# Patient Record
Sex: Male | Born: 1935
Health system: Southern US, Community
[De-identification: ages and names within clinical notes are randomized; demographics above are authoritative.]

## PROBLEM LIST (undated history)

## (undated) DIAGNOSIS — Z9889 Other specified postprocedural states: Secondary | ICD-10-CM

## (undated) DIAGNOSIS — M1710 Unilateral primary osteoarthritis, unspecified knee: Secondary | ICD-10-CM

## (undated) DIAGNOSIS — Z951 Presence of aortocoronary bypass graft: Secondary | ICD-10-CM

## (undated) DIAGNOSIS — M67439 Ganglion, unspecified wrist: Secondary | ICD-10-CM

## (undated) DIAGNOSIS — I10 Essential (primary) hypertension: Secondary | ICD-10-CM

## (undated) DIAGNOSIS — K219 Gastro-esophageal reflux disease without esophagitis: Secondary | ICD-10-CM

## (undated) DIAGNOSIS — M179 Osteoarthritis of knee, unspecified: Secondary | ICD-10-CM

## (undated) DIAGNOSIS — IMO0001 Reserved for inherently not codable concepts without codable children: Secondary | ICD-10-CM

## (undated) DIAGNOSIS — E785 Hyperlipidemia, unspecified: Secondary | ICD-10-CM

## (undated) DIAGNOSIS — E559 Vitamin D deficiency, unspecified: Secondary | ICD-10-CM

## (undated) DIAGNOSIS — I255 Ischemic cardiomyopathy: Secondary | ICD-10-CM

## (undated) DIAGNOSIS — R7303 Prediabetes: Secondary | ICD-10-CM

## (undated) DIAGNOSIS — Z95810 Presence of automatic (implantable) cardiac defibrillator: Secondary | ICD-10-CM

## (undated) DIAGNOSIS — I252 Old myocardial infarction: Secondary | ICD-10-CM

## (undated) HISTORY — DX: Unilateral primary osteoarthritis, unspecified knee: M17.10

## (undated) HISTORY — DX: Ganglion, unspecified wrist: M67.439

## (undated) HISTORY — DX: Ischemic cardiomyopathy: I25.5

## (undated) HISTORY — PX: KNEE SURGERY: SHX244

## (undated) HISTORY — DX: Presence of aortocoronary bypass graft: Z95.1

## (undated) HISTORY — DX: Hyperlipidemia, unspecified: E78.5

## (undated) HISTORY — DX: Osteoarthritis of knee, unspecified: M17.9

## (undated) HISTORY — DX: Gastro-esophageal reflux disease without esophagitis: K21.9

## (undated) HISTORY — DX: Presence of automatic (implantable) cardiac defibrillator: Z95.810

## (undated) HISTORY — DX: Old myocardial infarction: I25.2

## (undated) HISTORY — DX: Prediabetes: R73.03

## (undated) HISTORY — DX: Reserved for inherently not codable concepts without codable children: IMO0001

## (undated) HISTORY — DX: Vitamin D deficiency, unspecified: E55.9

## (undated) HISTORY — PX: INGUINAL HERNIA REPAIR: SUR1180

## (undated) HISTORY — DX: Other specified postprocedural states: Z98.890

---

## 1990-03-03 DIAGNOSIS — Z951 Presence of aortocoronary bypass graft: Secondary | ICD-10-CM

## 1990-03-03 HISTORY — PX: CORONARY ARTERY BYPASS GRAFT: SHX141

## 1990-03-03 HISTORY — DX: Presence of aortocoronary bypass graft: Z95.1

## 1997-06-07 ENCOUNTER — Ambulatory Visit (HOSPITAL_COMMUNITY): Admission: RE | Admit: 1997-06-07 | Discharge: 1997-06-07 | Payer: Self-pay | Admitting: Gastroenterology

## 2001-04-26 ENCOUNTER — Ambulatory Visit (HOSPITAL_COMMUNITY): Admission: RE | Admit: 2001-04-26 | Discharge: 2001-04-26 | Payer: Self-pay | Admitting: Gastroenterology

## 2004-03-03 DIAGNOSIS — Z9889 Other specified postprocedural states: Secondary | ICD-10-CM

## 2004-03-03 HISTORY — DX: Other specified postprocedural states: Z98.890

## 2004-03-03 HISTORY — PX: CARDIAC CATHETERIZATION: SHX172

## 2004-10-09 ENCOUNTER — Ambulatory Visit (HOSPITAL_COMMUNITY): Admission: RE | Admit: 2004-10-09 | Discharge: 2004-10-09 | Payer: Self-pay | Admitting: Internal Medicine

## 2004-10-10 ENCOUNTER — Ambulatory Visit (HOSPITAL_COMMUNITY): Admission: RE | Admit: 2004-10-10 | Discharge: 2004-10-10 | Payer: Self-pay | Admitting: Internal Medicine

## 2004-10-14 ENCOUNTER — Ambulatory Visit (HOSPITAL_COMMUNITY): Admission: RE | Admit: 2004-10-14 | Discharge: 2004-10-14 | Payer: Self-pay | Admitting: Cardiology

## 2004-10-14 HISTORY — PX: CARDIAC CATHETERIZATION: SHX172

## 2006-01-28 ENCOUNTER — Encounter (INDEPENDENT_AMBULATORY_CARE_PROVIDER_SITE_OTHER): Payer: Self-pay | Admitting: *Deleted

## 2006-01-28 ENCOUNTER — Ambulatory Visit (HOSPITAL_COMMUNITY): Admission: RE | Admit: 2006-01-28 | Discharge: 2006-01-28 | Payer: Self-pay | Admitting: Cardiology

## 2006-10-30 ENCOUNTER — Ambulatory Visit (HOSPITAL_COMMUNITY): Admission: RE | Admit: 2006-10-30 | Discharge: 2006-10-30 | Payer: Self-pay | Admitting: Internal Medicine

## 2008-03-03 HISTORY — PX: OTHER SURGICAL HISTORY: SHX169

## 2008-08-01 ENCOUNTER — Encounter: Payer: Self-pay | Admitting: Internal Medicine

## 2008-08-01 DIAGNOSIS — IMO0001 Reserved for inherently not codable concepts without codable children: Secondary | ICD-10-CM

## 2008-08-01 HISTORY — DX: Reserved for inherently not codable concepts without codable children: IMO0001

## 2008-08-10 ENCOUNTER — Encounter: Payer: Self-pay | Admitting: Internal Medicine

## 2008-08-23 ENCOUNTER — Encounter: Payer: Self-pay | Admitting: Internal Medicine

## 2008-09-11 ENCOUNTER — Ambulatory Visit: Payer: Self-pay | Admitting: Internal Medicine

## 2008-09-11 DIAGNOSIS — R609 Edema, unspecified: Secondary | ICD-10-CM

## 2008-09-14 ENCOUNTER — Encounter: Payer: Self-pay | Admitting: Internal Medicine

## 2008-09-29 ENCOUNTER — Ambulatory Visit: Payer: Self-pay | Admitting: Internal Medicine

## 2008-09-29 DIAGNOSIS — I255 Ischemic cardiomyopathy: Secondary | ICD-10-CM

## 2008-10-01 DIAGNOSIS — Z95810 Presence of automatic (implantable) cardiac defibrillator: Secondary | ICD-10-CM

## 2008-10-01 HISTORY — DX: Presence of automatic (implantable) cardiac defibrillator: Z95.810

## 2008-10-01 HISTORY — PX: CARDIAC DEFIBRILLATOR PLACEMENT: SHX171

## 2008-10-03 LAB — CONVERTED CEMR LAB
BUN: 15 mg/dL
Basophils Absolute: 0 10*3/uL
Basophils Relative: 0.7 %
CO2: 31 meq/L
Calcium: 9.3 mg/dL
Chloride: 97 meq/L
Creatinine, Ser: 0.8 mg/dL
Eosinophils Absolute: 0.2 10*3/uL
Eosinophils Relative: 3.7 %
GFR calc non Af Amer: 99.14 mL/min
Glucose, Bld: 90 mg/dL
HCT: 34.2 % — ABNORMAL LOW
Hemoglobin: 12 g/dL — ABNORMAL LOW
INR: 1
Lymphocytes Relative: 17.6 %
Lymphs Abs: 1 10*3/uL
MCHC: 35 g/dL
MCV: 98.5 fL
Monocytes Absolute: 0.7 10*3/uL
Monocytes Relative: 12.1 % — ABNORMAL HIGH
Neutro Abs: 3.7 10*3/uL
Neutrophils Relative %: 65.9 %
Platelets: 203 10*3/uL
Potassium: 4.9 meq/L
Prothrombin Time: 11.1 s
RBC: 3.47 M/uL — ABNORMAL LOW
RDW: 13 %
Sodium: 134 meq/L — ABNORMAL LOW
WBC: 5.6 10*3/uL
aPTT: 25 s

## 2008-10-05 ENCOUNTER — Inpatient Hospital Stay (HOSPITAL_COMMUNITY): Admission: RE | Admit: 2008-10-05 | Discharge: 2008-10-06 | Payer: Self-pay | Admitting: Internal Medicine

## 2008-10-05 ENCOUNTER — Ambulatory Visit: Payer: Self-pay | Admitting: Internal Medicine

## 2008-10-06 ENCOUNTER — Encounter: Payer: Self-pay | Admitting: Internal Medicine

## 2008-10-23 ENCOUNTER — Ambulatory Visit: Payer: Self-pay

## 2008-10-23 ENCOUNTER — Encounter: Payer: Self-pay | Admitting: Internal Medicine

## 2008-11-20 ENCOUNTER — Telehealth (INDEPENDENT_AMBULATORY_CARE_PROVIDER_SITE_OTHER): Payer: Self-pay | Admitting: *Deleted

## 2009-10-26 ENCOUNTER — Ambulatory Visit: Payer: Self-pay | Admitting: Cardiology

## 2010-01-24 ENCOUNTER — Ambulatory Visit: Payer: Self-pay | Admitting: Internal Medicine

## 2010-01-31 ENCOUNTER — Ambulatory Visit: Payer: Self-pay | Admitting: Cardiology

## 2010-02-13 ENCOUNTER — Encounter: Payer: Self-pay | Admitting: Internal Medicine

## 2010-04-04 NOTE — Cardiovascular Report (Signed)
Summary: Office Visit Remote   Office Visit Remote   Imported By: Roderic Ovens 02/14/2010 14:10:11  _____________________________________________________________________  External Attachment:    Type:   Image     Comment:   External Document

## 2010-04-04 NOTE — Letter (Signed)
Summary: Remote Device Check  [employer], Main Office  [address]   [place], [place] [address]   Phone: [phone]  Fax: [phone]     February 13, 2010 MRN: [id number]   [facility] [address] [place], [place]  [address]   Dear Richard Moody,   Your remote transmission was recieved and reviewed by your physician.  All diagnostics were within normal limits for you.  __X____Your next office visit is scheduled for:   04-08-10 @ 1015 with Dr Graciela Husbands.    Sincerely,  Vella Kohler

## 2010-04-08 ENCOUNTER — Encounter (INDEPENDENT_AMBULATORY_CARE_PROVIDER_SITE_OTHER): Payer: Medicare Other | Admitting: Internal Medicine

## 2010-04-08 ENCOUNTER — Encounter: Payer: Self-pay | Admitting: Internal Medicine

## 2010-04-08 DIAGNOSIS — I5022 Chronic systolic (congestive) heart failure: Secondary | ICD-10-CM | POA: Insufficient documentation

## 2010-04-08 DIAGNOSIS — I2589 Other forms of chronic ischemic heart disease: Secondary | ICD-10-CM

## 2010-04-08 DIAGNOSIS — Z95810 Presence of automatic (implantable) cardiac defibrillator: Secondary | ICD-10-CM | POA: Insufficient documentation

## 2010-04-18 NOTE — Miscellaneous (Signed)
  Clinical Lists Changes  Observations: Added new observation of PI CARDIO: Your physician recommends that you continue on your current medications as directed. Please refer to the Current Medication list given to you today. Your physician wants you to follow-up in: YEAR WITH DR Logan Bores will receive a reminder letter in the mail two months in advance. If you don't receive a letter, please call our office to schedule the follow-up appointment. (04/08/2010 11:14)      Patient Instructions: 1)  Your physician recommends that you continue on your current medications as directed. Please refer to the Current Medication list given to you today. 2)  Your physician wants you to follow-up in: YEAR WITH DR Logan Bores will receive a reminder letter in the mail two months in advance. If you don't receive a letter, please call our office to schedule the follow-up appointment.

## 2010-04-18 NOTE — Cardiovascular Report (Signed)
Summary: Office Visit   Office Visit   Imported By: Roderic Ovens 04/09/2010 16:05:34  _____________________________________________________________________  External Attachment:    Type:   Image     Comment:   External Document

## 2010-04-18 NOTE — Assessment & Plan Note (Signed)
Summary: phys defib check/icd/medtronic/kl   Referring Provider:  Delfin Edis Primary Provider:  Diamantina Monks  CC:  defib check.  History of Present Illness: Richard Moody is seen in followup for ICD implanted in 2010.  He has a history of ischemic heart disease with prior bypass surgery depressed left ventricular function with EES of 32%. His chronic systolic heart failure but has done relatively well. He actually underwent knee replacement surgery at [facility] this fall and tolerated that amazingly well. He denies chest pain or peripheral edema.  Current Medications (verified): 1)  Nexium 40 Mg Pack (Esomeprazole Magnesium) .Marland Kitchen.. 1 Once Daily 2)  Enablex 7.5 Mg Xr24h-Tab (Darifenacin Hydrobromide) .Marland Kitchen.. 1 Tab Once Daily 3)  Quinapril Hcl 40 Mg Tabs (Quinapril Hcl) .Marland Kitchen.. 1 Tab Qd 4)  Toprol Xl 50 Mg Xr24h-Tab (Metoprolol Succinate) .... Once Daily 5)  Xanax 1 Mg Tabs (Alprazolam) .... As Needed 6)  Furosemide 40 Mg Tabs (Furosemide) .... Take 2 Tablets Daily 7)  Ultravate 0.05 % Crea (Halobetasol Propionate) .... Apply To Affected Areas 1-2 Times Per Day 8)  Clobetasol Propionate 0.05 % Soln (Clobetasol Propionate) .... Apply To Scalp 1-3 Times Per Week 9)  Fludrocortisone Acetate 0.1 Mg Tabs (Fludrocortisone Acetate) .... Take 1 Tablet By Mouth Once A Day 10)  Aspirin 81 Mg Tbec (Aspirin) .... Take 3 Tablets Weekly 11)  Calcium Carbonate-Vitamin D 600-400 Mg-Unit  Tabs (Calcium Carbonate-Vitamin D) .... Take 2 Tablets Daily 12)  Glucosamine-Chondroitin   Caps (Glucosamine-Chondroit-Vit C-Mn) .... Take 2 Capsules By Mouth Daily 13)  Vitamin C 500 Mg  Tabs (Ascorbic Acid) .... Take 4 Tablets Daily 14)  Vitamin D 1000 Unit  Tabs (Cholecalciferol) .... Take 4 Capsules Daily 15)  Red Yeast Rice   Powd (Red Yeast Rice Extract) .... Take 2/3 Scoop Per Day 16)  Magnesium 250 Mg Tabs (Magnesium) .... Take 1 Tablet Daily 17)  Allegra 180 Mg Tabs (Fexofenadine Hcl) .... Take 2 Tablets Daily 18)  Slow Fe  160 (50 Fe) Mg Cr-Tabs (Ferrous Sulfate Dried) .... Once Daily 19)  B Complex  Tabs (B Complex Vitamins) .... Once Daily 20)  Vicodin 5-500 Mg Tabs (Hydrocodone-Acetaminophen) .... Take 1-2 Tablets As Needed For Pain Every 6 Hrs  Allergies (verified): No Known Drug Allergies  Past History:  Past Medical History: Last updated: 09/11/2008 Artherosclerotic cerebrovascular disease hyperlipidemia CABG x 4 Anemia Osteoarthritis GI bleeding/reflux  Past Surgical History: Last updated: 09/11/2008 CABG x 4 inguinal hernia repair  Family History: Last updated: 2008/10/06 Father-deceased age 83.  HTN/CVA Mother-deceased/abdominal cancer family hx of colon ca.-mother and sister  Social History: Last updated: 2008/10/06 Married  Tobacco Use - No.   Past Cardiac History:  Cardiac Interventions: Cardiac Catheterization 2006 totally occluded RCA and distal LAD with severe diffuse disease in the circumflex Patent LIMA patent vein graft to the acute marginal and posterior descending and occluded vein graft to the circumflex  Vital Signs:  Patient profile:   77 year old male Height:      73 inches Weight:      166 pounds BMI:     21.98 Pulse rate:   62 / minute Pulse rhythm:   regular BP sitting:   112 / 66  (left arm) Cuff size:   regular  Vitals Entered By: Judithe Modest CMA (April 08, 2010 10:14 AM)  Physical Exam  General:  The patient was alert and oriented in no acute distress. HEENT Normal.  Neck veins were flat, carotids were brisk.  Lungs were clear.  Heart sounds were regular without murmurs or gallops.  Abdomen was soft with active bowel sounds. There is no clubbing cyanosis or edema. Skin Warm and dry     ICD Specifications Following MD:  Inactive     Referring MD:  TENNANT ICD Vendor:  Medtronic     ICD Model Number:  [id number]     ICD Serial Number:  [id number] ICD DOI:  10/05/2008     ICD Implanting MD:  Sherryl Manges, MD  Lead 1:    Location: RA      DOI: 10/05/2008     Model #: [id number]     Serial #: [id number]     Status: active Lead 2:    Location: RV     DOI: 10/05/2008     Model #: [id number]     Serial #: [id number]     Status: active  Indications::  VT/VF   ICD Follow Up Battery Voltage:  3.13 V     Charge Time:  9.4 seconds     Underlying rhythm:  SB @ 55 ICD Dependent:  No       ICD Device Measurements Atrium:  Amplitude: 3.6 mV, Impedance: 494 ohms, Threshold: 0.50 V at 0.40 msec Right Ventricle:  Amplitude: 12.6 mV, Impedance: 437 ohms, Threshold: 1.00 V at 0.40 msec Shock Impedance: 46/58 ohms   Episodes MS Episodes:  0     Shock:  0     ATP:  0     Nonsustained:  0     Atrial Therapies:  0 Atrial Pacing:  58.1%     Ventricular Pacing:  0.2%  Brady Parameters Mode MVP (R)     Lower Rate Limit:  60     Upper Rate Limit 120 PAV 180     Sensed AV Delay:  150  Tachy Zones VF:  222     VT:  188     Next Remote Date:  07/11/2010     Next Cardiology Appt Due:  04/04/2011 Tech Comments:  NORMAL DEVICE FUNCTION.  NO EPISODES SINCE LAST CHECK.  TURNED ON 1:1 SVT.  CARELINK 07-11-10 AND ROV IN 12 MTHS W/SK. Vella Kohler April 08, 2010 10:36 AM  Impression & Recommendations:  Problem # 1:  CARDIOMYOPATHY, ISCHEMIC S/P CABG (ICD-414.8) stable on his current medications. His updated medication list for this problem includes:    Quinapril Hcl 40 Mg Tabs (Quinapril hcl) .Marland Kitchen... 1 tab qd    Toprol Xl 50 Mg Xr24h-tab (Metoprolol succinate) ..... Once daily    Furosemide 40 Mg Tabs (Furosemide) .Marland Kitchen... Take 2 tablets daily    Aspirin 81 Mg Tbec (Aspirin) .Marland Kitchen... Take 3 tablets weekly  Problem # 2:  SYSTOLIC HEART FAILURE, CHRONIC (ICD-428.22) stable on current meds However, his optivol  index is significantly elevated. yet he isimpedance measurement has recently started to increase again. We will plan a recheck of his optivol in 2 weeks His updated medication list for this problem includes:    Quinapril Hcl 40 Mg Tabs (Quinapril hcl)  .Marland Kitchen... 1 tab qd    Toprol Xl 50 Mg Xr24h-tab (Metoprolol succinate) ..... Once daily    Furosemide 40 Mg Tabs (Furosemide) .Marland Kitchen... Take 2 tablets daily    Aspirin 81 Mg Tbec (Aspirin) .Marland Kitchen... Take 3 tablets weekly  Problem # 3:  AUTOMATIC IMPLANTABLE CARDIAC DEFIBRILLATOR SITU (ICD-V45.02) Device parameters and data were reviewed and no changes were made

## 2010-06-08 LAB — APTT: aPTT: 26 seconds (ref 24–37)

## 2010-06-08 LAB — BASIC METABOLIC PANEL
BUN: 15 mg/dL (ref 6–23)
Calcium: 9.7 mg/dL (ref 8.4–10.5)
Chloride: 93 mEq/L — ABNORMAL LOW (ref 96–112)
Creatinine, Ser: 0.82 mg/dL (ref 0.4–1.5)
GFR calc non Af Amer: >60 mL/min (ref >60)
Sodium: 131 mEq/L — ABNORMAL LOW (ref 135–145)

## 2010-06-08 LAB — PROTIME-INR: INR: 1 (ref 0.00–1.49)

## 2010-06-08 LAB — CBC
HCT: 35.9 % — ABNORMAL LOW (ref 39.0–52.0)
MCV: 99 fL (ref 78.0–100.0)
RDW: 13.1 % (ref 11.5–15.5)
WBC: 4.5 10*3/uL (ref 4.0–10.5)

## 2010-07-16 NOTE — Op Note (Signed)
NAMEBRENNYN, HAISLEY              ACCOUNT NO.:  [id number]   MEDICAL RECORD NO.:  [id number]          PATIENT TYPE:  INP   LOCATION:  3739                         FACILITY:  [facility]   PHYSICIAN:  [facility] Salvia, MD, FACCDATE OF BIRTH:  09-08-1927   DATE OF PROCEDURE:  10/05/2008  DATE OF DISCHARGE:                               OPERATIVE REPORT   PREOPERATIVE DIAGNOSES:  Ischemic cardiomyopathy with depressed left  ventricular function and bradycardia.   POSTOPERATIVE DIAGNOSES:  Ischemic cardiomyopathy with depressed left  ventricular function and bradycardia.   PROCEDURES:  Dual-chamber defibrillator implantation with intraoperative  defibrillation threshold testing.   DESCRIPTION OF PROCEDURE:  Following obtaining informed consent, the  patient was brought to the electrophysiology laboratory and placed on  the fluoroscopic table in the supine position.   After routine prep and drape with care to exclude the excoriated  shoulder, lidocaine was infiltrated in line about a centimeter caudal to  the normal site given the shoulder issue.  An incision was made and  carried down to the layer of the prepectoral fascia using electrocautery  and sharp dissection.  Unfortunately, I violated the pectoral fascia at  this point.  The pocket was then formed, hemostasis was obtained.   Attention was then turned to gain the access to the extrathoracic left  subclavian vein, which was accomplished without difficulty, without the  aspiration of air or puncture of the artery.  Two separate venipunctures  were accomplished.  Guidewires were placed and retained and 0 silk  suture was placed in a figure-of-eight fashion and allowed to hang  loosely.   Sequentially, 9-French and 7-French sheath were placed, which were  passed respectively.  A Sprint Quattro Secure [id number] dual-coil  defibrillator lead, serial number [id number] and a Medtronic 5076, 52-cm  active-fixation atrial lead, serial number  [id number].  Under  fluoroscopic guidance, these leads were manipulated to the right  ventricular apex and the right atrial appendage remnant respectively  where the bipolar P wave was 2.2 with a pace impedance of 675 and  threshold 0.7 V at 0.5 msec.  Current threshold was 1.5 mA.  There was  no diaphragmatic pacing at 10 V, and the current of injury was brisk.   Bipolar R wave was 12.2 with a pace impedance of 825 and threshold of  0.75 at 0.5.  Current threshold was 1.2 mA and there was no  diaphragmatic pacing at 10 V.  Current of injury was brisk.   These leads were secured to the prepectoral fascia and a hemostatic  suture was secured.  The leads were then attached to a Medtronic  Virtuoso II ICD, model number D274DRG, serial number [id number].  Through the device, the bipolar P wave was 2.2 with a pace impedance of  437 and threshold of 1 V at 0.4.  The bipolar R wave was 7.4 with a pace  impedance of 551 and threshold of 0.75 at 0.4.  Proximal coil impedance  was 46 ohms.  Distal coil impedance was 40 ohms.   At this point, ICD implantation was undertaken with defibrillation  threshold testing proceeding.  Ventricular fibrillation was induced via  the T wave shock.  After a total duration of 6.5 seconds, a 15-J shock  was delivered through a measured resistance of 39 ohms terminating  ventricular fibrillation and restoring sinus rhythm.  The device was  implanted.  The pocket was copiously irrigated with antibiotic-  containing saline solution.  Hemostasis was assured.  Surgicel was  placed over the layer of the exposed pectoralis muscle and the device  was secured.  The wound was then closed in 3 layers in a normal fashion.  The wound was washed, dried, and a benzoin, Steri-Strip dressing was  applied.  Needle counts, sponge counts, and instrument counts were  correct at the end of the procedure according to the staff.  The patient  tolerated the procedure without apparent  complication.      Duke Salvia, MD, [facility]  Electronically Signed     SCK/MEDQ  D:  10/05/2008  T:  10/05/2008  Job:  [id number]   cc:   Colleen Can. Deborah Chalk, M.D.

## 2010-07-16 NOTE — Discharge Summary (Signed)
Richard Moody, Richard Moody              ACCOUNT NO.:  [id number]   MEDICAL RECORD NO.:  [id number]          PATIENT TYPE:  INP   LOCATION:  3739                         FACILITY:  [facility]   PHYSICIAN:  [facility] Salvia, MD, FACCDATE OF BIRTH:  1927-12-07   DATE OF ADMISSION:  10/05/2008  DATE OF DISCHARGE:  10/06/2008                               DISCHARGE SUMMARY   ALLERGIES:  This patient has no known drug allergies.   Time for this dictation and to arrange medications according to the new  computer programmed method, greater than 45 minutes.   FINAL DIAGNOSIS:  Discharging day 1, status post implant of a Medtronic  VIRTUOSO II dual-chamber cardioverter defibrillator.   SECONDARY DIAGNOSES:  1. Ischemic cardiomyopathy, ejection fraction 32% at recent Myoview      study.  2. History of myocardial infarction in 1989.  2A.  Recurrent myocardial infarction in 1992, with finding of 3-vessel  coronary artery disease.  2B.  Coronary artery bypass graft surgery x4 in 1992.  1. Left heart catheterization on October 14, 2004, ejection fraction 30-      35%.  3A.  Severe native coronary artery disease.  3B.  Saphenous vein graft to the acute marginal of the posterior  descending artery is patent.  3C.  Saphenous vein graft to the obtuse marginal, is 100% occluded.  3D.  The left internal mammary artery to the left anterior descending is  patent.  1. New York Heart Association classroom IIB chronic systolic      congestive heart failure.  2. Extracranial cerebrovascular occlusive disease.  3. Dyslipidemia.  4. Anemia.  5. Osteoarthritis.  The patient needs knee replacement surgery.  6. History of gastrointestinal bleed.  7. Gastroesophageal reflux disease.   PROCEDURE:  On October 05, 2008, implant of the Medtronic dual-chamber  VIRTUOSO II cardioverter-defibrillator.  The patient had slight ooze,  which has now stopped after the procedure.  He is not unduly suffering  pain from the  procedure.  The device has been interrogated, all values  are within normal limits.  Mobility of the left arm and incision care  have been described to the patient.  He is asked to keep his incision  dry for the next 7 days.  He is to sponge bathe until Thursday October 12, 2008.  He is asked not to drive for the next week.  Once again,  mobility of the left arm has been described to the patient.   FOLLOWUP APPOINTMENTS:  1. Implantable cardioverter-defibrillator Clinic at [facility],      at [address], [address] [place]; Monday, October 23, 2008,      at 9 o'clock.  He has an appointment with Dr. Deborah Chalk on Monday,      October 23, 2008, at 11 o'clock.  He will also see Dr. Deborah Chalk in 3      months.  Dr. Ronnald Nian office will call with that appointment.   MEDICATIONS AT DISCHARGE:  1. Allegra 180 mg twice daily.  2. Alprazolam 1 mg twice daily.  3. Aspirin 81 mg daily.  4. Calcium carbonate vitamin D 1  tablet daily.  5. Clobetasol propionate 0.05% solution apply to the scalp 1-3 times      weekly.  6. Enablex 7.5 mg daily.  7. Darvocet-N 100 one to three tablets by mouth daily as needed.  8. Fish oil caps twice daily.  9. Furosemide 40 mg twice daily.  10.Fludrocortisone acetate 0.1 mg daily.  11.Magnesium sulfate 250 mg daily.  12.Nexium 40 mg daily.  13.Quinapril 40 mg daily.  14.Toprol-XL 50 mg daily.  15.Vitamin D3 daily.  16.Ultravate 0.05% cream, topically twice daily as needed.  17.Vitamin C 1 tablet daily.   BRIEF HISTORY:  Mr. Fohl is a 77 year old male.  He had his first  heart attack in 1989.  He had a second attack in 1992, and this resulted  in coronary artery bypass graft surgery.  He had a recent  catheterization in 2006, which showed severe 3-vessel coronary artery  disease in his native coronaries.  The saphenous vein graft to the  obtuse marginal was totally occluded.  The LIMA to the LAD is patent as  well as a saphenous vein graft to the  acute marginal and the PDA.  Recent Myoview study showed an ejection fraction of 32% with scar, but  no significant ischemia.  The patient's New York Heart Association class  is enmeshed with difficulty ambulating due to osteoarthritis.  He is  currently being considered for knee replacement surgery.  His New York  Heart Association class congestive heart failure could be rated IIB.   The patient certainly is a candidate for ICD implantation given prior  heart attacks and depressed left ventricular function, this would be  placed for primary prevention.  This can be done on an elective basis  and the timing worked out with scheduling for knee surgery.   HOSPITAL COURSE:  The patient presents electively on October 05, 2008, he  underwent procedure to implant a dual-chamber pacemaker the same day.  He has had no postprocedural complications.  He was seen by Dr. Graciela Husbands,  postprocedure day #1 and considered fit for discharge.  The device was  interrogated well.  He has follow up appointments with both Dr. Graciela Husbands  and Dr. Deborah Chalk.   LABORATORY STUDIES THIS ADMISSION:  White cells 4.5, hemoglobin 12.5,  hematocrit 35.9, and platelets are 199.  Sodium 131, potassium 4.4,  chloride 93, carbonate 30, BUN is 15, creatinine 0.82, glucose 89,  Protime 12.6, and INR is 1.      Maple Mirza, [place]      Duke Salvia, MD, [facility]  Electronically Signed    GM/MEDQ  D:  10/06/2008  T:  10/06/2008  Job:  [id number]   cc:   Colleen Can. Deborah Chalk, M.D.  Lucky Cowboy, M.D.
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
## 2010-07-19 NOTE — Cardiovascular Report (Signed)
NAMELELDON, Moody              ACCOUNT NO.:  [id number]   MEDICAL RECORD NO.:  [id number]          PATIENT TYPE:  OIB   LOCATION:  2899                         FACILITY:  [facility]   PHYSICIAN:  Colleen Can. Deborah Chalk, M.D.DATE OF BIRTH:  14-Sep-1927   DATE OF PROCEDURE:  10/14/2004  DATE OF DISCHARGE:  10/14/2004                              CARDIAC CATHETERIZATION   PROCEDURE:  Left heart catheterization with selective coronary angiography,  left ventricular angiography, angiography with saphenous vein grafts x2, and  angiography of left internal mammary artery.   Type and site of entry percutaneous right femoral artery, Angio-Seal for  closure.   CATHETERS:  A 6-French 4 curved Judkins right and left coronary catheters, 6-  French pigtail ventriculographic catheter, 6-French left internal mammary  graft catheter, 6-French right bypass graft catheter.   CONTRAST:  Pure Omnipaque.   MEDICATIONS:  Given prior to the procedure, Valium 10 milligrams.   MEDICATIONS:  Given during the procedure, Versed 2 milligrams IV.   COMMENT:  The patient tolerated the procedure well.   HEMODYNAMIC DATA:  The aortic pressure of 149/82, LV was 163/9-19. There is  no aortic valve gradient noted on pullback.   ANGIOGRAPHIC DATA:  1.  Left main coronary artery has 50% distal narrowing.  2.  Left circumflex has a 60% ostial narrowing and a distal 95% narrowing      prior to the posterior lateral branch. This was a diffuse segmental area      of disease. There is a more proximal obtuse marginal which has a diffuse      segmental 80-90% stenosis. There is retrograde flow into a partial limb      of the saphenous vein graft at this point. Distal vessel is widely      patent.  3.  Right coronary artery is totally occluded proximally.  4.  Left anterior descending has calcification proximally with a 50-60%      segmental narrowing. There are proximal diagonal vessels which are      patent. The left  anterior descending is totally occluded in the      midportion.  5.  Saphenous vein graft to the acute marginal, posterior descending artery      is widely patent.  6.  Saphenous vein graft to the obtuse marginal was occluded.  7.  Left internal mammary graft to the LAD is patent. It has nice insertion      and good distal runoff. The flow appears to be limited primarily to the      left anterior descending distribution.   Left ventricular angiogram was performed in the RAO projection. Estimated  ejection fraction 30-35% range. There is posterior lateral hypokinesia that  is noted. There is no mitral regurgitation.   OVERALL IMPRESSION:  1.  Moderately severe left ventricular dysfunction with posterior lateral      hypokinesia.  2.  Totally occluded right coronary artery and totally occluded distal left      anterior descending with severe diffuse disease in the left circumflex      which was not ideally suited for angioplasty.  3.  Patent left internal mammary artery to the left anterior descending      artery, patent saphenous vein graft to the acute marginal and posterior      descending branches of the right coronary artery, and totally occluded      saphenous vein graft to the left circumflex.   DISCUSSION:  We will try to do a stress Cardiolite study to evaluate the  patient for ischemia versus scar. Even if this were an area of marked  ischemia, there would be a good deal of difficulty in performing a  successful percutaneous intervention and probably will try to manage [name] medically.      Colleen Can. Deborah Chalk, M.D.  Electronically Signed     SNT/MEDQ  D:  10/14/2004  T:  10/14/2004  Job:  [id number]

## 2010-08-20 ENCOUNTER — Encounter: Payer: Self-pay | Admitting: Nurse Practitioner

## 2010-08-26 ENCOUNTER — Ambulatory Visit (INDEPENDENT_AMBULATORY_CARE_PROVIDER_SITE_OTHER): Payer: Medicare Other | Admitting: Nurse Practitioner

## 2010-08-26 ENCOUNTER — Ambulatory Visit: Payer: Medicare Other | Admitting: Nurse Practitioner

## 2010-08-26 ENCOUNTER — Encounter: Payer: Self-pay | Admitting: Nurse Practitioner

## 2010-08-26 VITALS — BP 104/66 | HR 66 | Ht 71.5 in | Wt 164.0 lb

## 2010-08-26 DIAGNOSIS — I5022 Chronic systolic (congestive) heart failure: Secondary | ICD-10-CM

## 2010-08-26 NOTE — Patient Instructions (Signed)
Stay on your current medicines I am going to have you see Dr. Graciela Husbands for follow up in 4 months. Call for any problems.

## 2010-08-26 NOTE — Progress Notes (Signed)
Richard Moody [id number] Date of Birth: 03-17-1927   History of Present Illness: Richard Moody is seen today for his 6 month visit. He is seen for Dr. Graciela Husbands. He is a former patient of Dr. Ronnald Nian. He is doing well from our standpoint. No chest pain. He is not short of breath. No swelling. He has an ischemic cardiomyopathy. ICD is in place. He is on diuretic, ACE and beta blocker. He is not dizzy. He remains limited from his arthritis. He has considerable right shoulder pain and knee pain. His last stress test was in 2010. He has been followed clinically.   Current Outpatient Prescriptions on File Prior to Visit  Medication Sig Dispense Refill  . ALPRAZolam (XANAX) 1 MG tablet Take 1.5 mg by mouth daily.       Marland Kitchen aspirin 81 MG tablet Take 81 mg by mouth 3 (three) times a week.        . Calcium Carbonate (CALTRATE 600 PO) Take by mouth daily. 2 DAILY       . Cholecalciferol (VITAMIN D) 2000 UNITS tablet Take 5,000 Units by mouth daily.        . clobetasol (TEMOVATE) 0.05 % cream Apply topically 3 (three) times a week.        . darifenacin (ENABLEX) 7.5 MG 24 hr tablet Take 7.5 mg by mouth daily.        Marland Kitchen esomeprazole (NEXIUM) 40 MG capsule Take 40 mg by mouth daily before breakfast.        . fexofenadine (ALLEGRA) 180 MG tablet Take 180 mg by mouth daily.        . fish oil-omega-3 fatty acids 1000 MG capsule Take 2 g by mouth daily.        . fludrocortisone (FLORINEF) 0.1 MG tablet Take 0.1 mg by mouth daily.        . furosemide (LASIX) 40 MG tablet Take 40 mg by mouth 2 (two) times daily.        Marland Kitchen glucosamine-chondroitin 500-400 MG tablet Take 1 tablet by mouth 2 (two) times daily.        Marland Kitchen HYDROcodone-acetaminophen (NORCO) 5-325 MG per tablet Take 1 tablet by mouth every 6 (six) hours as needed.        . IRON PO Take 65 mg by mouth daily.       . Magnesium 250 MG TABS Take by mouth daily. 2 DAILY       . metoprolol (TOPROL-XL) 50 MG 24 hr tablet Take 50 mg by mouth daily.        . polyethylene glycol  (MIRALAX / GLYCOLAX) packet Take 17 g by mouth as needed.        . quinapril (ACCUPRIL) 40 MG tablet Take 40 mg by mouth at bedtime.        . Red Yeast Rice Extract (RED YEAST RICE PO) Take by mouth daily. 2 DAILY       . vitamin C (ASCORBIC ACID) 500 MG tablet Take 1,000 mg by mouth daily.        Marland Kitchen DISCONTD: Coenzyme Q10 (COQ10 PO) Take by mouth daily. 2 DAILY         No Known Allergies  Past Medical History  Diagnosis Date  . Ischemic cardiomyopathy     EF 32%  . OA (osteoarthritis) of knee   . Ganglion cyst of wrist     LEFT WRIST  . MI, old     INFERIOR LATERAL  . Hyperlipidemia   . GERD (  gastroesophageal reflux disease)   . ICD (implantable cardiac defibrillator) in place Richard 2010  . Hx of CABG 1992  . S/P cardiac cath 2006    Occluded SVG to OM. Managed medically  . Normal nuclear stress test June 2010    No ischemia. EF 32%    Past Surgical History  Procedure Date  . Cardiac catheterization 10/14/2004    THERE IS POSTERIOR LATERAL HYPOKINESIA. EF 30-35%  . Coronary artery bypass graft 1992  . Knee surgery     RIGHT KNEE  . Cardiac defibrillator placement 10/2008  . Inguinal hernia repair   . Cardiac catheterization 2006    OCCLUDED SVG TO OM  . Nuclear stress test 2010    EF 32%. No ischemia    History  Smoking status  . Former Smoker  . Quit date: 03/03/1982  Smokeless tobacco  . Never Used    History  Alcohol Use No    Family History  Problem Relation Age of Onset  . Cancer Mother     ABDOMINAL CANCER  . Hypertension Father   . Stroke Father     Review of Systems: The review of systems is positive for arthritis. No chest pain. No PND or orthopnea.  He has his labs with his PCP. All other systems were reviewed and are negative.  Physical Exam: BP 104/66  Pulse 66  Ht 5' 11.5" (1.816 m)  Wt 164 lb (74.39 kg)  BMI 22.55 kg/m2 Patient is very pleasant and in no acute distress. Skin is warm and dry. Color looks a little sallow.  HEENT is  unremarkable. Normocephalic/atraumatic. PERRL. Sclera are nonicteric. Neck is supple. No masses. No JVD. Lungs are clear. Cardiac exam shows a regular rate and rhythm. He has a soft systolic murmur. Abdomen is soft. Extremities are without edema. Gait and ROM are intact. He has decreased ROM of the right shoulder. He has to use a cane. No gross neurologic deficits noted.  LABORATORY DATA: N/A  Assessment / Plan:

## 2010-08-26 NOTE — Assessment & Plan Note (Addendum)
He seems to be compensated at this time. He is mostly limited by his arthritis. We will continue with his current medicines. I don't think he has enough blood pressure to titrate his medicines up further. Dr. Graciela Husbands will do his general cardiology and ICD follow up. I will have him see August Saucer in 4 months. Patient is agreeable to this plan and will call if any problems develop in the interim.

## 2011-01-06 ENCOUNTER — Ambulatory Visit (INDEPENDENT_AMBULATORY_CARE_PROVIDER_SITE_OTHER): Payer: Medicare Other | Admitting: Internal Medicine

## 2011-01-06 ENCOUNTER — Encounter: Payer: Self-pay | Admitting: Internal Medicine

## 2011-01-06 DIAGNOSIS — Z95810 Presence of automatic (implantable) cardiac defibrillator: Secondary | ICD-10-CM

## 2011-01-06 DIAGNOSIS — I2589 Other forms of chronic ischemic heart disease: Secondary | ICD-10-CM

## 2011-01-06 DIAGNOSIS — I5022 Chronic systolic (congestive) heart failure: Secondary | ICD-10-CM

## 2011-01-06 DIAGNOSIS — I428 Other cardiomyopathies: Secondary | ICD-10-CM

## 2011-01-06 DIAGNOSIS — I255 Ischemic cardiomyopathy: Secondary | ICD-10-CM

## 2011-01-06 LAB — ICD DEVICE OBSERVATION
AL AMPLITUDE: 3.1 mv
AL IMPEDENCE ICD: 494 Ohm
ATRIAL PACING ICD: 74.42 pct
CHARGE TIME: 9.629 s
RV LEAD AMPLITUDE: 13.5 mv
RV LEAD IMPEDENCE ICD: 437 Ohm
TOT-0001: 1
TOT-0002: 0
TZAT-0001ATACH: 1
TZAT-0001ATACH: 2
TZAT-0001FASTVT: 1
TZAT-0001SLOWVT: 1
TZAT-0001SLOWVT: 2
TZAT-0002ATACH: NEGATIVE
TZAT-0002ATACH: NEGATIVE
TZAT-0002FASTVT: NEGATIVE
TZAT-0004SLOWVT: 8
TZAT-0004SLOWVT: 8
TZAT-0012ATACH: 150 ms
TZAT-0012FASTVT: 200 ms
TZAT-0012SLOWVT: 200 ms
TZAT-0012SLOWVT: 200 ms
TZAT-0013SLOWVT: 2
TZAT-0013SLOWVT: 2
TZAT-0018ATACH: NEGATIVE
TZAT-0018FASTVT: NEGATIVE
TZAT-0018SLOWVT: NEGATIVE
TZAT-0018SLOWVT: NEGATIVE
TZAT-0019ATACH: 6 V
TZAT-0019ATACH: 6 V
TZAT-0019ATACH: 6 V
TZAT-0019SLOWVT: 8 V
TZAT-0019SLOWVT: 8 V
TZAT-0020ATACH: 1.5 ms
TZAT-0020SLOWVT: 1.5 ms
TZAT-0020SLOWVT: 1.5 ms
TZON-0003SLOWVT: 320 ms
TZON-0003VSLOWVT: 400 ms
TZON-0004SLOWVT: 16
TZON-0004VSLOWVT: 28
TZST-0001ATACH: 5
TZST-0001FASTVT: 4
TZST-0001FASTVT: 6
TZST-0001SLOWVT: 3
TZST-0001SLOWVT: 5
TZST-0002FASTVT: NEGATIVE
TZST-0002FASTVT: NEGATIVE
TZST-0002FASTVT: NEGATIVE
TZST-0003SLOWVT: 35 J
TZST-0003SLOWVT: 35 J

## 2011-01-06 NOTE — Assessment & Plan Note (Signed)
Stable. I am not sure why he is on Florinef. I will have Dr. Astrid Divine review this with him at their next visit

## 2011-01-06 NOTE — Patient Instructions (Signed)

## 2011-01-06 NOTE — Assessment & Plan Note (Signed)
The patient's device was interrogated.  The information was reviewed. No changes were made in the programming.    

## 2011-01-06 NOTE — Progress Notes (Signed)
HPI  Richard Moody is a 77 y.o. male  seen in followup for ICD implanted in 2010.   He has a history of ischemic heart disease with prior bypass surgery depressed left ventricular function with EFof 32%. His chronic systolic heart failure but has done relatively well.     He denies chest pain or peripheral edema;  He does not know why he takes Florinef   Past Medical History  Diagnosis Date  . Ischemic cardiomyopathy     EF 32%  . OA (osteoarthritis) of knee   . Ganglion cyst of wrist     LEFT WRIST  . MI, old     INFERIOR LATERAL  . Hyperlipidemia   . GERD (gastroesophageal reflux disease)   . ICD (implantable cardiac defibrillator) in place August 2010  . Hx of CABG 1992  . S/P cardiac cath 2006    Occluded SVG to OM. Managed medically  . Normal nuclear stress test June 2010    No ischemia. EF 32%    Past Surgical History  Procedure Date  . Cardiac catheterization 10/14/2004    THERE IS POSTERIOR LATERAL HYPOKINESIA. EF 30-35%  . Coronary artery bypass graft 1992  . Knee surgery     RIGHT KNEE  . Cardiac defibrillator placement 10/2008  . Inguinal hernia repair   . Cardiac catheterization 2006    OCCLUDED SVG TO OM  . Nuclear stress test 2010    EF 32%. No ischemia    Current Outpatient Prescriptions  Medication Sig Dispense Refill  . ALPRAZolam (XANAX) 1 MG tablet Take 1.5 mg by mouth 2 (two) times daily.       Marland Kitchen aspirin 81 MG tablet Take 81 mg by mouth 3 (three) times a week.        . B Complex-C (SUPER B COMPLEX PO) Take 1 tablet by mouth daily.        . Cholecalciferol (VITAMIN D3) 5000 UNITS CAPS Take 2 capsules by mouth daily.        Marland Kitchen darifenacin (ENABLEX) 7.5 MG 24 hr tablet Take 7.5 mg by mouth daily.       Marland Kitchen esomeprazole (NEXIUM) 40 MG capsule Take 40 mg by mouth daily before breakfast.        . fexofenadine (ALLEGRA) 180 MG tablet Take 180 mg by mouth daily.        . fish oil-omega-3 fatty acids 1000 MG capsule Take 2 g by mouth daily.        .  fludrocortisone (FLORINEF) 0.1 MG tablet Take 0.1 mg by mouth daily.        . furosemide (LASIX) 40 MG tablet Take 40 mg by mouth 2 (two) times daily.        Marland Kitchen glucosamine-chondroitin 500-400 MG tablet Take 1 tablet by mouth 2 (two) times daily.        Marland Kitchen HYDROcodone-acetaminophen (NORCO) 5-325 MG per tablet Take 1 tablet by mouth every 6 (six) hours as needed.        . IRON PO Take 65 mg by mouth. 3 x a week       . Magnesium 250 MG TABS Take 400 tablets by mouth daily. 2 DAILY      . metoprolol (TOPROL-XL) 50 MG 24 hr tablet Take 50 mg by mouth daily.        . Multiple Minerals-Vitamins (CITRACAL PLUS PO) Take 500 capsules by mouth 2 (two) times daily.        . NON FORMULARY Halo beta  a sol cream .05% 2-4- x per week as needed       . polyethylene glycol (MIRALAX / GLYCOLAX) packet Take 17 g by mouth as needed.        . quinapril (ACCUPRIL) 40 MG tablet Take 40 mg by mouth at bedtime.        . Red Yeast Rice Extract (RED YEAST RICE PO) Take by mouth daily. 2 DAILY       . vitamin C (ASCORBIC ACID) 500 MG tablet Take 1,000 mg by mouth daily.          No Known Allergies  Review of Systems negative except from HPI and PMH  Physical Exam Well developed and well nourished in no acute distress HENT normal E scleral and icterus clear Neck Supple JVP flat; carotids brisk and full Clear to ausculation Regular rate and rhythm, S4 present without a murmur Soft with active bowel sounds No clubbing cyanosis and edema Alert and oriented, grossly normal motor and sensory function Skin Warm and Dry    Assessment and  Plan

## 2011-01-06 NOTE — Assessment & Plan Note (Signed)
On current medications.

## 2011-04-10 ENCOUNTER — Ambulatory Visit (INDEPENDENT_AMBULATORY_CARE_PROVIDER_SITE_OTHER): Payer: Medicare Other | Admitting: *Deleted

## 2011-04-10 ENCOUNTER — Encounter: Payer: Self-pay | Admitting: Internal Medicine

## 2011-04-10 DIAGNOSIS — Z95810 Presence of automatic (implantable) cardiac defibrillator: Secondary | ICD-10-CM | POA: Diagnosis not present

## 2011-04-10 DIAGNOSIS — I2589 Other forms of chronic ischemic heart disease: Secondary | ICD-10-CM

## 2011-04-10 DIAGNOSIS — I509 Heart failure, unspecified: Secondary | ICD-10-CM

## 2011-04-10 DIAGNOSIS — I255 Ischemic cardiomyopathy: Secondary | ICD-10-CM

## 2011-04-11 LAB — REMOTE ICD DEVICE
BAMS-0001: 170 {beats}/min
BATTERY VOLTAGE: 3.0989 V
DEV-0020ICD: NEGATIVE
FVT: 0
PACEART VT: 0
RV LEAD AMPLITUDE: 12 mv
TZAT-0001ATACH: 1
TZAT-0001FASTVT: 1
TZAT-0004SLOWVT: 8
TZAT-0004SLOWVT: 8
TZAT-0011SLOWVT: 10 ms
TZAT-0011SLOWVT: 10 ms
TZAT-0012ATACH: 150 ms
TZAT-0012ATACH: 150 ms
TZAT-0012ATACH: 150 ms
TZAT-0012SLOWVT: 200 ms
TZAT-0012SLOWVT: 200 ms
TZAT-0013SLOWVT: 2
TZAT-0013SLOWVT: 2
TZAT-0018ATACH: NEGATIVE
TZAT-0018ATACH: NEGATIVE
TZAT-0020ATACH: 1.5 ms
TZAT-0020ATACH: 1.5 ms
TZAT-0020ATACH: 1.5 ms
TZAT-0020FASTVT: 1.5 ms
TZAT-0020SLOWVT: 1.5 ms
TZAT-0020SLOWVT: 1.5 ms
TZON-0003ATACH: 350 ms
TZON-0003SLOWVT: 320 ms
TZON-0003VSLOWVT: 400 ms
TZON-0004SLOWVT: 16
TZST-0001ATACH: 4
TZST-0001FASTVT: 2
TZST-0001FASTVT: 4
TZST-0001FASTVT: 6
TZST-0001SLOWVT: 4
TZST-0001SLOWVT: 5
TZST-0001SLOWVT: 6
TZST-0002ATACH: NEGATIVE
TZST-0002ATACH: NEGATIVE
TZST-0002FASTVT: NEGATIVE
TZST-0002FASTVT: NEGATIVE
TZST-0003SLOWVT: 35 J
TZST-0003SLOWVT: 35 J
TZST-0003SLOWVT: 35 J
VENTRICULAR PACING ICD: 0.01 pct
VF: 0

## 2011-04-18 NOTE — Progress Notes (Signed)
ICD remote 

## 2011-04-22 ENCOUNTER — Encounter: Payer: Self-pay | Admitting: *Deleted

## 2011-05-08 DIAGNOSIS — E559 Vitamin D deficiency, unspecified: Secondary | ICD-10-CM | POA: Diagnosis not present

## 2011-05-08 DIAGNOSIS — R7309 Other abnormal glucose: Secondary | ICD-10-CM | POA: Diagnosis not present

## 2011-05-08 DIAGNOSIS — Z79899 Other long term (current) drug therapy: Secondary | ICD-10-CM | POA: Diagnosis not present

## 2011-05-08 DIAGNOSIS — I10 Essential (primary) hypertension: Secondary | ICD-10-CM | POA: Diagnosis not present

## 2011-05-08 DIAGNOSIS — E782 Mixed hyperlipidemia: Secondary | ICD-10-CM | POA: Diagnosis not present

## 2011-05-16 DIAGNOSIS — D692 Other nonthrombocytopenic purpura: Secondary | ICD-10-CM | POA: Diagnosis not present

## 2011-05-16 DIAGNOSIS — L821 Other seborrheic keratosis: Secondary | ICD-10-CM | POA: Diagnosis not present

## 2011-05-16 DIAGNOSIS — L578 Other skin changes due to chronic exposure to nonionizing radiation: Secondary | ICD-10-CM | POA: Diagnosis not present

## 2011-05-28 DIAGNOSIS — B351 Tinea unguium: Secondary | ICD-10-CM | POA: Diagnosis not present

## 2011-07-10 ENCOUNTER — Ambulatory Visit (INDEPENDENT_AMBULATORY_CARE_PROVIDER_SITE_OTHER): Payer: Medicare Other | Admitting: *Deleted

## 2011-07-10 ENCOUNTER — Encounter: Payer: Self-pay | Admitting: Internal Medicine

## 2011-07-10 DIAGNOSIS — I2589 Other forms of chronic ischemic heart disease: Secondary | ICD-10-CM | POA: Diagnosis not present

## 2011-07-10 DIAGNOSIS — I255 Ischemic cardiomyopathy: Secondary | ICD-10-CM

## 2011-07-10 DIAGNOSIS — I5022 Chronic systolic (congestive) heart failure: Secondary | ICD-10-CM

## 2011-07-16 LAB — REMOTE ICD DEVICE
ATRIAL PACING ICD: 71.7 pct
FVT: 0
TZAT-0001ATACH: 1
TZAT-0001ATACH: 3
TZAT-0001FASTVT: 1
TZAT-0001SLOWVT: 1
TZAT-0001SLOWVT: 2
TZAT-0002ATACH: NEGATIVE
TZAT-0002ATACH: NEGATIVE
TZAT-0002FASTVT: NEGATIVE
TZAT-0005SLOWVT: 88 pct
TZAT-0005SLOWVT: 91 pct
TZAT-0011SLOWVT: 10 ms
TZAT-0012ATACH: 150 ms
TZAT-0013SLOWVT: 2
TZAT-0013SLOWVT: 2
TZAT-0018ATACH: NEGATIVE
TZAT-0018ATACH: NEGATIVE
TZAT-0018SLOWVT: NEGATIVE
TZAT-0018SLOWVT: NEGATIVE
TZAT-0019ATACH: 6 V
TZAT-0019ATACH: 6 V
TZON-0004SLOWVT: 16
TZON-0005SLOWVT: 12
TZST-0001ATACH: 4
TZST-0001ATACH: 5
TZST-0001FASTVT: 3
TZST-0001FASTVT: 4
TZST-0001SLOWVT: 3
TZST-0001SLOWVT: 4
TZST-0001SLOWVT: 6
TZST-0002ATACH: NEGATIVE
TZST-0002ATACH: NEGATIVE
TZST-0002FASTVT: NEGATIVE
TZST-0002FASTVT: NEGATIVE
TZST-0002FASTVT: NEGATIVE
TZST-0002FASTVT: NEGATIVE
TZST-0003SLOWVT: 35 J
TZST-0003SLOWVT: 35 J
VENTRICULAR PACING ICD: 0 pct
VF: 0

## 2011-07-23 NOTE — Progress Notes (Signed)
ICD remote with ICM 

## 2011-08-04 ENCOUNTER — Encounter: Payer: Self-pay | Admitting: *Deleted

## 2011-08-18 ENCOUNTER — Encounter: Payer: Self-pay | Admitting: Internal Medicine

## 2011-08-18 DIAGNOSIS — Z79899 Other long term (current) drug therapy: Secondary | ICD-10-CM | POA: Diagnosis not present

## 2011-08-18 DIAGNOSIS — I10 Essential (primary) hypertension: Secondary | ICD-10-CM | POA: Diagnosis not present

## 2011-08-18 DIAGNOSIS — E782 Mixed hyperlipidemia: Secondary | ICD-10-CM | POA: Diagnosis not present

## 2011-08-18 DIAGNOSIS — Z125 Encounter for screening for malignant neoplasm of prostate: Secondary | ICD-10-CM | POA: Diagnosis not present

## 2011-08-18 DIAGNOSIS — E119 Type 2 diabetes mellitus without complications: Secondary | ICD-10-CM | POA: Diagnosis not present

## 2011-08-18 DIAGNOSIS — E559 Vitamin D deficiency, unspecified: Secondary | ICD-10-CM | POA: Diagnosis not present

## 2011-08-18 DIAGNOSIS — Z1212 Encounter for screening for malignant neoplasm of rectum: Secondary | ICD-10-CM | POA: Diagnosis not present

## 2011-08-19 DIAGNOSIS — N529 Male erectile dysfunction, unspecified: Secondary | ICD-10-CM | POA: Diagnosis not present

## 2011-08-19 DIAGNOSIS — N393 Stress incontinence (female) (male): Secondary | ICD-10-CM | POA: Diagnosis not present

## 2011-08-19 DIAGNOSIS — R350 Frequency of micturition: Secondary | ICD-10-CM | POA: Diagnosis not present

## 2011-09-04 DIAGNOSIS — R69 Illness, unspecified: Secondary | ICD-10-CM | POA: Diagnosis not present

## 2011-09-05 ENCOUNTER — Telehealth: Payer: Self-pay | Admitting: Internal Medicine

## 2011-09-05 DIAGNOSIS — I509 Heart failure, unspecified: Secondary | ICD-10-CM

## 2011-09-05 NOTE — Telephone Encounter (Signed)
Please return call to patient regarding low blood pressure, he can be reached at [phone].  Patient fainted.   WARM XFER TO JODETTE

## 2011-09-05 NOTE — Telephone Encounter (Signed)
Dr. Graciela Husbands was made aware of the patient's episode and BP from yesterday. Per Dr. Graciela Husbands, decrease metoprolol to 25 mg daily and decrease accupril to 20 mg daily. He should also send a transmission. I have spoken with the patient and made him aware of his med changes. He states his BP today was 108/63. I have also advised him to send a transmission for his ICD. He states he does not know how to do this. I explained I would ask Gunnar Fusi from the device clinic to call him. He is agreeable and voices understanding. I have spoken with Gunnar Fusi and she will call the patient.

## 2011-09-05 NOTE — Telephone Encounter (Signed)
Yesterday pt passed out/ rescue squad came, bp 70/40 and states ekg was ok. Today feeling good bp 101/52 p 62, yesterday he was getting up to walk to the bathroom when his wife saw him pass out and he complained of dizziness. Please advise pt , told him not to take his metoprolol or Accupril till he hears back from [place], pt agreed to plan.

## 2011-09-08 ENCOUNTER — Telehealth: Payer: Self-pay | Admitting: Internal Medicine

## 2011-09-08 NOTE — Telephone Encounter (Signed)
Transmission received, patient aware. 

## 2011-09-08 NOTE — Telephone Encounter (Signed)
Pt has questions regarding his transmission

## 2011-09-11 ENCOUNTER — Telehealth: Payer: Self-pay | Admitting: Internal Medicine

## 2011-09-11 NOTE — Telephone Encounter (Signed)
Pt calling back to see what results were from tranmission

## 2011-09-12 NOTE — Telephone Encounter (Signed)
Transmission received, patient aware a letter will be sent out after MD signs off.

## 2011-10-16 ENCOUNTER — Encounter: Payer: Self-pay | Admitting: Internal Medicine

## 2011-10-16 ENCOUNTER — Ambulatory Visit (INDEPENDENT_AMBULATORY_CARE_PROVIDER_SITE_OTHER): Payer: Medicare Other | Admitting: *Deleted

## 2011-10-16 DIAGNOSIS — I2589 Other forms of chronic ischemic heart disease: Secondary | ICD-10-CM

## 2011-10-16 DIAGNOSIS — I5022 Chronic systolic (congestive) heart failure: Secondary | ICD-10-CM | POA: Diagnosis not present

## 2011-10-16 DIAGNOSIS — I255 Ischemic cardiomyopathy: Secondary | ICD-10-CM

## 2011-10-17 LAB — REMOTE ICD DEVICE
ATRIAL PACING ICD: 38.54 pct
CHARGE TIME: 10.069 s
DEV-0020ICD: NEGATIVE
PACEART VT: 0
RV LEAD AMPLITUDE: 9.9 mv
TOT-0001: 1
TOT-0002: 0
TZAT-0001ATACH: 3
TZAT-0001FASTVT: 1
TZAT-0002ATACH: NEGATIVE
TZAT-0002ATACH: NEGATIVE
TZAT-0002FASTVT: NEGATIVE
TZAT-0011SLOWVT: 10 ms
TZAT-0011SLOWVT: 10 ms
TZAT-0012ATACH: 150 ms
TZAT-0012FASTVT: 200 ms
TZAT-0012SLOWVT: 200 ms
TZAT-0018ATACH: NEGATIVE
TZAT-0018SLOWVT: NEGATIVE
TZAT-0018SLOWVT: NEGATIVE
TZAT-0019ATACH: 6 V
TZAT-0019ATACH: 6 V
TZAT-0019SLOWVT: 8 V
TZAT-0019SLOWVT: 8 V
TZAT-0020ATACH: 1.5 ms
TZAT-0020ATACH: 1.5 ms
TZAT-0020ATACH: 1.5 ms
TZAT-0020SLOWVT: 1.5 ms
TZAT-0020SLOWVT: 1.5 ms
TZON-0003VSLOWVT: 400 ms
TZON-0005SLOWVT: 12
TZST-0001ATACH: 5
TZST-0001ATACH: 6
TZST-0001FASTVT: 3
TZST-0001FASTVT: 4
TZST-0001FASTVT: 5
TZST-0001SLOWVT: 4
TZST-0001SLOWVT: 5
TZST-0002ATACH: NEGATIVE
TZST-0002FASTVT: NEGATIVE
TZST-0002FASTVT: NEGATIVE
TZST-0002FASTVT: NEGATIVE
TZST-0003SLOWVT: 35 J
TZST-0003SLOWVT: 35 J
VENTRICULAR PACING ICD: 0 pct

## 2011-11-04 ENCOUNTER — Encounter: Payer: Self-pay | Admitting: *Deleted

## 2011-11-18 DIAGNOSIS — R7309 Other abnormal glucose: Secondary | ICD-10-CM | POA: Diagnosis not present

## 2011-11-18 DIAGNOSIS — N3000 Acute cystitis without hematuria: Secondary | ICD-10-CM | POA: Diagnosis not present

## 2011-11-18 DIAGNOSIS — I10 Essential (primary) hypertension: Secondary | ICD-10-CM | POA: Diagnosis not present

## 2011-11-18 DIAGNOSIS — E782 Mixed hyperlipidemia: Secondary | ICD-10-CM | POA: Diagnosis not present

## 2011-11-18 DIAGNOSIS — E559 Vitamin D deficiency, unspecified: Secondary | ICD-10-CM | POA: Diagnosis not present

## 2011-11-18 DIAGNOSIS — Z79899 Other long term (current) drug therapy: Secondary | ICD-10-CM | POA: Diagnosis not present

## 2011-11-19 ENCOUNTER — Other Ambulatory Visit (HOSPITAL_COMMUNITY): Payer: Self-pay | Admitting: Physician Assistant

## 2011-11-19 ENCOUNTER — Ambulatory Visit (HOSPITAL_COMMUNITY)
Admission: RE | Admit: 2011-11-19 | Discharge: 2011-11-19 | Disposition: A | Discharge status: Home / Self Care | Payer: Medicare Other | Source: Ambulatory Visit | Attending: Physician Assistant | Admitting: Physician Assistant

## 2011-11-19 DIAGNOSIS — I2510 Atherosclerotic heart disease of native coronary artery without angina pectoris: Secondary | ICD-10-CM | POA: Insufficient documentation

## 2011-11-19 DIAGNOSIS — Z0000 Encounter for general adult medical examination without abnormal findings: Secondary | ICD-10-CM | POA: Diagnosis not present

## 2011-11-19 DIAGNOSIS — I10 Essential (primary) hypertension: Secondary | ICD-10-CM | POA: Insufficient documentation

## 2011-11-19 DIAGNOSIS — R634 Abnormal weight loss: Secondary | ICD-10-CM | POA: Diagnosis not present

## 2011-11-19 DIAGNOSIS — K449 Diaphragmatic hernia without obstruction or gangrene: Secondary | ICD-10-CM | POA: Diagnosis not present

## 2012-01-06 DIAGNOSIS — H2510 Age-related nuclear cataract, unspecified eye: Secondary | ICD-10-CM | POA: Diagnosis not present

## 2012-01-19 DIAGNOSIS — Z23 Encounter for immunization: Secondary | ICD-10-CM | POA: Diagnosis not present

## 2012-01-27 ENCOUNTER — Encounter: Payer: Self-pay | Admitting: Internal Medicine

## 2012-01-27 ENCOUNTER — Ambulatory Visit (INDEPENDENT_AMBULATORY_CARE_PROVIDER_SITE_OTHER): Payer: Medicare Other | Admitting: Internal Medicine

## 2012-01-27 VITALS — BP 116/66 | HR 64 | Ht 72.0 in | Wt 152.0 lb

## 2012-01-27 DIAGNOSIS — Z95810 Presence of automatic (implantable) cardiac defibrillator: Secondary | ICD-10-CM | POA: Diagnosis not present

## 2012-01-27 DIAGNOSIS — I5022 Chronic systolic (congestive) heart failure: Secondary | ICD-10-CM | POA: Diagnosis not present

## 2012-01-27 DIAGNOSIS — I2589 Other forms of chronic ischemic heart disease: Secondary | ICD-10-CM

## 2012-01-27 DIAGNOSIS — I255 Ischemic cardiomyopathy: Secondary | ICD-10-CM

## 2012-01-27 LAB — ICD DEVICE OBSERVATION
AL AMPLITUDE: 4.1 mv
AL IMPEDENCE ICD: 532 Ohm
AL THRESHOLD: 0.75 V
BAMS-0001: 170 {beats}/min
BATTERY VOLTAGE: 3.1 V
DEV-0020ICD: NEGATIVE
RV LEAD AMPLITUDE: 14.3 mv
RV LEAD THRESHOLD: 1.25 V
TZAT-0001ATACH: 2
TZAT-0001ATACH: 3
TZAT-0001FASTVT: 1
TZAT-0001SLOWVT: 1
TZAT-0001SLOWVT: 2
TZAT-0004SLOWVT: 8
TZAT-0004SLOWVT: 8
TZAT-0005SLOWVT: 88 pct
TZAT-0012ATACH: 150 ms
TZAT-0012ATACH: 150 ms
TZAT-0012ATACH: 150 ms
TZAT-0012FASTVT: 200 ms
TZAT-0018ATACH: NEGATIVE
TZAT-0018FASTVT: NEGATIVE
TZAT-0019FASTVT: 8 V
TZAT-0020ATACH: 1.5 ms
TZAT-0020SLOWVT: 1.5 ms
TZAT-0020SLOWVT: 1.5 ms
TZON-0003ATACH: 350 ms
TZON-0003SLOWVT: 320 ms
TZON-0003VSLOWVT: 400 ms
TZON-0004VSLOWVT: 28
TZST-0001ATACH: 4
TZST-0001ATACH: 6
TZST-0001FASTVT: 2
TZST-0001FASTVT: 4
TZST-0001FASTVT: 6
TZST-0001SLOWVT: 3
TZST-0001SLOWVT: 5
TZST-0002ATACH: NEGATIVE
TZST-0002FASTVT: NEGATIVE
TZST-0002FASTVT: NEGATIVE
TZST-0002FASTVT: NEGATIVE
TZST-0003SLOWVT: 35 J
TZST-0003SLOWVT: 35 J

## 2012-01-27 NOTE — Assessment & Plan Note (Signed)
Stable

## 2012-01-27 NOTE — Progress Notes (Signed)
Patient Care Team: Lucky Cowboy, MD as PCP - General (Internal Medicine) Lucky Cowboy, MD (Internal Medicine)   HPI  Richard Moody is a 78 y.o. male seen in followup for ICD implanted in 2010.  He has a history of ischemic heart disease with prior bypass surgery depressed left ventricular function with EFof 32%. His chronic systolic heart failure but has done relatively well.  He denies chest pain or peripheral edema;   He is not very active.  Past Medical History  Diagnosis Date  . Ischemic cardiomyopathy     EF 32%  . OA (osteoarthritis) of knee   . Ganglion cyst of wrist     LEFT WRIST  . MI, old     INFERIOR LATERAL  . Hyperlipidemia   . GERD (gastroesophageal reflux disease)   . ICD (implantable cardiac defibrillator) in place August 2010  . Hx of CABG 1992  . S/P cardiac cath 2006    Occluded SVG to OM. Managed medically  . Normal nuclear stress test June 2010    No ischemia. EF 32%    Past Surgical History  Procedure Date  . Cardiac catheterization 10/14/2004    THERE IS POSTERIOR LATERAL HYPOKINESIA. EF 30-35%  . Coronary artery bypass graft 1992  . Knee surgery     RIGHT KNEE  . Cardiac defibrillator placement 10/2008  . Inguinal hernia repair   . Cardiac catheterization 2006    OCCLUDED SVG TO OM  . Nuclear stress test 2010    EF 32%. No ischemia    Current Outpatient Prescriptions  Medication Sig Dispense Refill  . ALPRAZolam (XANAX) 1 MG tablet Take 1.5 mg by mouth 2 (two) times daily.       Marland Kitchen aspirin 81 MG tablet Take 81 mg by mouth 3 (three) times a week.        . B Complex-C (SUPER B COMPLEX PO) Take 1 tablet by mouth daily.        . Cholecalciferol (VITAMIN D3) 5000 UNITS CAPS Take 2 capsules by mouth daily.        Marland Kitchen darifenacin (ENABLEX) 7.5 MG 24 hr tablet Take 7.5 mg by mouth daily.       Marland Kitchen esomeprazole (NEXIUM) 40 MG capsule Take 40 mg by mouth daily before breakfast.        . fexofenadine (ALLEGRA) 180 MG tablet Take 180 mg by mouth  daily.        . fish oil-omega-3 fatty acids 1000 MG capsule Take 2 g by mouth daily.        . fludrocortisone (FLORINEF) 0.1 MG tablet Take 0.1 mg by mouth daily.        . furosemide (LASIX) 40 MG tablet Take 40 mg by mouth 2 (two) times daily.        Marland Kitchen glucosamine-chondroitin 500-400 MG tablet Take 1 tablet by mouth 2 (two) times daily.        Marland Kitchen HYDROcodone-acetaminophen (NORCO) 5-325 MG per tablet Take 1 tablet by mouth every 6 (six) hours as needed.        . IRON PO Take 65 mg by mouth. 3 x a week       . Magnesium 250 MG TABS Take 400 tablets by mouth daily. 2 DAILY      . metoprolol succinate (TOPROL-XL) 50 MG 24 hr tablet Take 1/2 tablet by mouth once daily      . Multiple Minerals-Vitamins (CITRACAL PLUS PO) Take 500 capsules by mouth 2 (two) times daily.        Marland Kitchen  NON FORMULARY Halo beta a sol cream .05% 2-4- x per week as needed       . polyethylene glycol (MIRALAX / GLYCOLAX) packet Take 17 g by mouth as needed.        . quinapril (ACCUPRIL) 40 MG tablet Take 1/2 tablet by mouth once daily      . Red Yeast Rice Extract (RED YEAST RICE PO) Take by mouth daily. 2 DAILY       . vitamin C (ASCORBIC ACID) 500 MG tablet Take 1,000 mg by mouth daily.          No Known Allergies  Review of Systems negative except from HPI and PMH  Physical Exam BP 116/66  Pulse 64  Ht 6' (1.829 m)  Wt 152 lb (68.947 kg)  BMI 20.61 kg/m2 Well developed and well nourished in no acute distress HENT normal E scleral and icterus clear Neck Supple JVP flat;   Clear to ausculation  Regular rate and rhythm, no murmurs gallops or rub Soft with active bowel sounds No clubbing cyanosis none Edema Alert and oriented, grossly normal motor and sensory function Skin Warm and Dry    Assessment and  Plan

## 2012-01-27 NOTE — Assessment & Plan Note (Signed)
The patient's device was interrogated.  The information was reviewed. No changes were made in the programming.    

## 2012-01-27 NOTE — Patient Instructions (Addendum)
Remote monitoring is used to monitor your Pacemaker of ICD from home. This monitoring reduces the number of office visits required to check your device to one time per year. It allows [place] to keep an eye on the functioning of your device to ensure it is working properly. You are scheduled for a device check from home on 05/03/2012. You may send your transmission at any time that day. If you have a wireless device, the transmission will be sent automatically. After your physician reviews your transmission, you will receive a postcard with your next transmission date.  Your physician wants you to follow-up in: 6 months with [facility] in the device clinic.Marland Kitchen You will receive a reminder letter in the mail two months in advance. If you don't receive a letter, please call our office to schedule the follow-up appointment.  Your physician wants you to follow-up in: 12 months with Dr. Graciela Husbands.  You will receive a reminder letter in the mail two months in advance. If you don't receive a letter, please call our office to schedule the follow-up appointment.

## 2012-02-23 DIAGNOSIS — E782 Mixed hyperlipidemia: Secondary | ICD-10-CM | POA: Diagnosis not present

## 2012-02-23 DIAGNOSIS — R7309 Other abnormal glucose: Secondary | ICD-10-CM | POA: Diagnosis not present

## 2012-02-23 DIAGNOSIS — I10 Essential (primary) hypertension: Secondary | ICD-10-CM | POA: Diagnosis not present

## 2012-02-23 DIAGNOSIS — E559 Vitamin D deficiency, unspecified: Secondary | ICD-10-CM | POA: Diagnosis not present

## 2012-02-23 DIAGNOSIS — Z79899 Other long term (current) drug therapy: Secondary | ICD-10-CM | POA: Diagnosis not present

## 2012-03-10 ENCOUNTER — Telehealth: Payer: Self-pay | Admitting: Internal Medicine

## 2012-03-10 NOTE — Telephone Encounter (Signed)
I left a message for Marcelino Duster that the clearance has been faxed. The office will have her call if there is a problem.

## 2012-03-10 NOTE — Telephone Encounter (Signed)
Pt having surgery on Friday and they need the clearance asap she has been leaving messages and has re-faxed form ans still no reply please contact asap

## 2012-03-15 DIAGNOSIS — H2510 Age-related nuclear cataract, unspecified eye: Secondary | ICD-10-CM | POA: Diagnosis not present

## 2012-03-15 DIAGNOSIS — H269 Unspecified cataract: Secondary | ICD-10-CM | POA: Diagnosis not present

## 2012-05-03 ENCOUNTER — Ambulatory Visit (INDEPENDENT_AMBULATORY_CARE_PROVIDER_SITE_OTHER): Payer: Medicare Other | Admitting: *Deleted

## 2012-05-03 ENCOUNTER — Encounter: Payer: Self-pay | Admitting: Internal Medicine

## 2012-05-03 ENCOUNTER — Other Ambulatory Visit: Payer: Self-pay

## 2012-05-03 DIAGNOSIS — I255 Ischemic cardiomyopathy: Secondary | ICD-10-CM

## 2012-05-03 DIAGNOSIS — I2589 Other forms of chronic ischemic heart disease: Secondary | ICD-10-CM

## 2012-05-03 DIAGNOSIS — Z95810 Presence of automatic (implantable) cardiac defibrillator: Secondary | ICD-10-CM | POA: Diagnosis not present

## 2012-05-03 DIAGNOSIS — I5022 Chronic systolic (congestive) heart failure: Secondary | ICD-10-CM

## 2012-05-04 ENCOUNTER — Encounter: Payer: Self-pay | Admitting: Internal Medicine

## 2012-05-05 LAB — REMOTE ICD DEVICE
AL AMPLITUDE: 2.1 mv
BATTERY VOLTAGE: 3.0648 V
FVT: 0
RV LEAD AMPLITUDE: 9.8 mv
RV LEAD IMPEDENCE ICD: 475 Ohm
TZAT-0001ATACH: 1
TZAT-0001ATACH: 2
TZAT-0002ATACH: NEGATIVE
TZAT-0002ATACH: NEGATIVE
TZAT-0002ATACH: NEGATIVE
TZAT-0004SLOWVT: 8
TZAT-0004SLOWVT: 8
TZAT-0005SLOWVT: 88 pct
TZAT-0005SLOWVT: 91 pct
TZAT-0011SLOWVT: 10 ms
TZAT-0011SLOWVT: 10 ms
TZAT-0018ATACH: NEGATIVE
TZAT-0018ATACH: NEGATIVE
TZAT-0018ATACH: NEGATIVE
TZAT-0018SLOWVT: NEGATIVE
TZAT-0018SLOWVT: NEGATIVE
TZAT-0019ATACH: 6 V
TZAT-0019ATACH: 6 V
TZAT-0019FASTVT: 8 V
TZAT-0020ATACH: 1.5 ms
TZAT-0020FASTVT: 1.5 ms
TZON-0004SLOWVT: 16
TZON-0004VSLOWVT: 28
TZON-0005SLOWVT: 12
TZST-0001ATACH: 5
TZST-0001FASTVT: 3
TZST-0001FASTVT: 5
TZST-0001FASTVT: 6
TZST-0001SLOWVT: 3
TZST-0001SLOWVT: 4
TZST-0001SLOWVT: 6
TZST-0002ATACH: NEGATIVE
TZST-0002ATACH: NEGATIVE
TZST-0002FASTVT: NEGATIVE
TZST-0002FASTVT: NEGATIVE
TZST-0003SLOWVT: 35 J
TZST-0003SLOWVT: 35 J
VF: 0

## 2012-05-14 ENCOUNTER — Encounter: Payer: Self-pay | Admitting: *Deleted

## 2012-05-14 DIAGNOSIS — L2089 Other atopic dermatitis: Secondary | ICD-10-CM | POA: Diagnosis not present

## 2012-05-14 DIAGNOSIS — L723 Sebaceous cyst: Secondary | ICD-10-CM | POA: Diagnosis not present

## 2012-05-14 DIAGNOSIS — L578 Other skin changes due to chronic exposure to nonionizing radiation: Secondary | ICD-10-CM | POA: Diagnosis not present

## 2012-05-14 DIAGNOSIS — D692 Other nonthrombocytopenic purpura: Secondary | ICD-10-CM | POA: Diagnosis not present

## 2012-05-14 DIAGNOSIS — L821 Other seborrheic keratosis: Secondary | ICD-10-CM | POA: Diagnosis not present

## 2012-05-14 DIAGNOSIS — L819 Disorder of pigmentation, unspecified: Secondary | ICD-10-CM | POA: Diagnosis not present

## 2012-05-14 DIAGNOSIS — L910 Hypertrophic scar: Secondary | ICD-10-CM | POA: Diagnosis not present

## 2012-05-25 DIAGNOSIS — R7309 Other abnormal glucose: Secondary | ICD-10-CM | POA: Diagnosis not present

## 2012-05-25 DIAGNOSIS — I10 Essential (primary) hypertension: Secondary | ICD-10-CM | POA: Diagnosis not present

## 2012-05-25 DIAGNOSIS — E559 Vitamin D deficiency, unspecified: Secondary | ICD-10-CM | POA: Diagnosis not present

## 2012-05-25 DIAGNOSIS — E782 Mixed hyperlipidemia: Secondary | ICD-10-CM | POA: Diagnosis not present

## 2012-05-25 DIAGNOSIS — Z79899 Other long term (current) drug therapy: Secondary | ICD-10-CM | POA: Diagnosis not present

## 2012-07-20 DIAGNOSIS — Z961 Presence of intraocular lens: Secondary | ICD-10-CM | POA: Diagnosis not present

## 2012-07-20 DIAGNOSIS — H2510 Age-related nuclear cataract, unspecified eye: Secondary | ICD-10-CM | POA: Diagnosis not present

## 2012-07-20 DIAGNOSIS — H25049 Posterior subcapsular polar age-related cataract, unspecified eye: Secondary | ICD-10-CM | POA: Diagnosis not present

## 2012-07-20 DIAGNOSIS — H25019 Cortical age-related cataract, unspecified eye: Secondary | ICD-10-CM | POA: Diagnosis not present

## 2012-08-02 ENCOUNTER — Ambulatory Visit (INDEPENDENT_AMBULATORY_CARE_PROVIDER_SITE_OTHER): Payer: Medicare Other | Admitting: *Deleted

## 2012-08-02 ENCOUNTER — Encounter: Payer: Self-pay | Admitting: Internal Medicine

## 2012-08-02 DIAGNOSIS — I2589 Other forms of chronic ischemic heart disease: Secondary | ICD-10-CM

## 2012-08-02 DIAGNOSIS — I5022 Chronic systolic (congestive) heart failure: Secondary | ICD-10-CM

## 2012-08-02 DIAGNOSIS — I255 Ischemic cardiomyopathy: Secondary | ICD-10-CM

## 2012-08-02 DIAGNOSIS — Z95810 Presence of automatic (implantable) cardiac defibrillator: Secondary | ICD-10-CM

## 2012-08-02 LAB — REMOTE ICD DEVICE
BAMS-0001: 170 {beats}/min
BATTERY VOLTAGE: 3.0648 V
DEV-0020ICD: NEGATIVE
FVT: 0
PACEART VT: 0
RV LEAD AMPLITUDE: 10.9 mv
TZAT-0001ATACH: 1
TZAT-0001FASTVT: 1
TZAT-0002ATACH: NEGATIVE
TZAT-0004SLOWVT: 8
TZAT-0011SLOWVT: 10 ms
TZAT-0011SLOWVT: 10 ms
TZAT-0012ATACH: 150 ms
TZAT-0012ATACH: 150 ms
TZAT-0012SLOWVT: 200 ms
TZAT-0012SLOWVT: 200 ms
TZAT-0013SLOWVT: 2
TZAT-0013SLOWVT: 2
TZAT-0019ATACH: 6 V
TZAT-0019SLOWVT: 8 V
TZAT-0019SLOWVT: 8 V
TZAT-0020ATACH: 1.5 ms
TZAT-0020ATACH: 1.5 ms
TZAT-0020FASTVT: 1.5 ms
TZON-0003ATACH: 350 ms
TZON-0003SLOWVT: 320 ms
TZON-0004SLOWVT: 16
TZST-0001ATACH: 4
TZST-0001ATACH: 5
TZST-0001FASTVT: 2
TZST-0001FASTVT: 3
TZST-0001SLOWVT: 4
TZST-0002ATACH: NEGATIVE
TZST-0002FASTVT: NEGATIVE
TZST-0002FASTVT: NEGATIVE
TZST-0002FASTVT: NEGATIVE
TZST-0003SLOWVT: 35 J
TZST-0003SLOWVT: 35 J
VENTRICULAR PACING ICD: 0 pct

## 2012-08-03 ENCOUNTER — Ambulatory Visit (INDEPENDENT_AMBULATORY_CARE_PROVIDER_SITE_OTHER): Payer: Medicare Other | Admitting: *Deleted

## 2012-08-03 DIAGNOSIS — Z95810 Presence of automatic (implantable) cardiac defibrillator: Secondary | ICD-10-CM | POA: Diagnosis not present

## 2012-08-03 DIAGNOSIS — I255 Ischemic cardiomyopathy: Secondary | ICD-10-CM

## 2012-08-03 DIAGNOSIS — I2589 Other forms of chronic ischemic heart disease: Secondary | ICD-10-CM

## 2012-08-03 DIAGNOSIS — I5022 Chronic systolic (congestive) heart failure: Secondary | ICD-10-CM

## 2012-08-06 LAB — REMOTE ICD DEVICE
AL IMPEDENCE ICD: 494 Ohm
ATRIAL PACING ICD: 50.69 pct
FVT: 0
PACEART VT: 0
TOT-0006: 20100805000000
TZAT-0001ATACH: 1
TZAT-0001ATACH: 2
TZAT-0001ATACH: 3
TZAT-0001FASTVT: 1
TZAT-0001SLOWVT: 1
TZAT-0001SLOWVT: 2
TZAT-0002ATACH: NEGATIVE
TZAT-0002ATACH: NEGATIVE
TZAT-0002FASTVT: NEGATIVE
TZAT-0005SLOWVT: 91 pct
TZAT-0012ATACH: 150 ms
TZAT-0012ATACH: 150 ms
TZAT-0012SLOWVT: 200 ms
TZAT-0012SLOWVT: 200 ms
TZAT-0013SLOWVT: 2
TZAT-0013SLOWVT: 2
TZAT-0018ATACH: NEGATIVE
TZAT-0018ATACH: NEGATIVE
TZAT-0018ATACH: NEGATIVE
TZAT-0018SLOWVT: NEGATIVE
TZAT-0018SLOWVT: NEGATIVE
TZAT-0019ATACH: 6 V
TZAT-0020ATACH: 1.5 ms
TZAT-0020SLOWVT: 1.5 ms
TZAT-0020SLOWVT: 1.5 ms
TZON-0003ATACH: 350 ms
TZON-0003SLOWVT: 320 ms
TZON-0004SLOWVT: 16
TZON-0004VSLOWVT: 28
TZON-0005SLOWVT: 12
TZST-0001ATACH: 4
TZST-0001ATACH: 5
TZST-0001ATACH: 6
TZST-0001FASTVT: 3
TZST-0001FASTVT: 4
TZST-0001SLOWVT: 3
TZST-0001SLOWVT: 6
TZST-0002ATACH: NEGATIVE
TZST-0002ATACH: NEGATIVE
TZST-0002FASTVT: NEGATIVE
TZST-0002FASTVT: NEGATIVE
TZST-0002FASTVT: NEGATIVE
TZST-0003SLOWVT: 35 J
TZST-0003SLOWVT: 35 J
VENTRICULAR PACING ICD: 0 pct
VF: 0

## 2012-08-24 DIAGNOSIS — I10 Essential (primary) hypertension: Secondary | ICD-10-CM | POA: Diagnosis not present

## 2012-08-24 DIAGNOSIS — Z125 Encounter for screening for malignant neoplasm of prostate: Secondary | ICD-10-CM | POA: Diagnosis not present

## 2012-08-24 DIAGNOSIS — R7309 Other abnormal glucose: Secondary | ICD-10-CM | POA: Diagnosis not present

## 2012-08-24 DIAGNOSIS — Z79899 Other long term (current) drug therapy: Secondary | ICD-10-CM | POA: Diagnosis not present

## 2012-08-24 DIAGNOSIS — E559 Vitamin D deficiency, unspecified: Secondary | ICD-10-CM | POA: Diagnosis not present

## 2012-08-24 DIAGNOSIS — E782 Mixed hyperlipidemia: Secondary | ICD-10-CM | POA: Diagnosis not present

## 2012-08-24 DIAGNOSIS — Z1212 Encounter for screening for malignant neoplasm of rectum: Secondary | ICD-10-CM | POA: Diagnosis not present

## 2012-08-26 ENCOUNTER — Encounter: Payer: Self-pay | Admitting: *Deleted

## 2012-08-30 DIAGNOSIS — H2510 Age-related nuclear cataract, unspecified eye: Secondary | ICD-10-CM | POA: Diagnosis not present

## 2012-08-30 DIAGNOSIS — H269 Unspecified cataract: Secondary | ICD-10-CM | POA: Diagnosis not present

## 2012-08-30 HISTORY — PX: CATARACT EXTRACTION, BILATERAL: SHX1313

## 2012-11-08 ENCOUNTER — Ambulatory Visit (INDEPENDENT_AMBULATORY_CARE_PROVIDER_SITE_OTHER): Payer: Medicare Other | Admitting: *Deleted

## 2012-11-08 DIAGNOSIS — I5022 Chronic systolic (congestive) heart failure: Secondary | ICD-10-CM

## 2012-11-08 DIAGNOSIS — I2589 Other forms of chronic ischemic heart disease: Secondary | ICD-10-CM

## 2012-11-08 DIAGNOSIS — Z95810 Presence of automatic (implantable) cardiac defibrillator: Secondary | ICD-10-CM

## 2012-11-08 DIAGNOSIS — I255 Ischemic cardiomyopathy: Secondary | ICD-10-CM

## 2012-11-17 LAB — REMOTE ICD DEVICE
AL IMPEDENCE ICD: 475 Ohm
BAMS-0001: 170 {beats}/min
BATTERY VOLTAGE: 3.0443 V
CHARGE TIME: 10.42 s
RV LEAD AMPLITUDE: 9.3 mv
RV LEAD IMPEDENCE ICD: 437 Ohm
TOT-0001: 1
TOT-0002: 0
TOT-0006: 20100805000000
TZAT-0001ATACH: 2
TZAT-0001ATACH: 3
TZAT-0001SLOWVT: 2
TZAT-0004SLOWVT: 8
TZAT-0004SLOWVT: 8
TZAT-0012ATACH: 150 ms
TZAT-0012FASTVT: 200 ms
TZAT-0012SLOWVT: 200 ms
TZAT-0018ATACH: NEGATIVE
TZAT-0018FASTVT: NEGATIVE
TZAT-0019ATACH: 6 V
TZAT-0019SLOWVT: 8 V
TZAT-0019SLOWVT: 8 V
TZAT-0020ATACH: 1.5 ms
TZAT-0020ATACH: 1.5 ms
TZAT-0020ATACH: 1.5 ms
TZAT-0020FASTVT: 1.5 ms
TZAT-0020SLOWVT: 1.5 ms
TZAT-0020SLOWVT: 1.5 ms
TZON-0003SLOWVT: 320 ms
TZON-0003VSLOWVT: 400 ms
TZON-0004VSLOWVT: 28
TZST-0001ATACH: 6
TZST-0001FASTVT: 2
TZST-0001FASTVT: 4
TZST-0001FASTVT: 5
TZST-0001FASTVT: 6
TZST-0001SLOWVT: 3
TZST-0001SLOWVT: 5
TZST-0002ATACH: NEGATIVE
TZST-0002FASTVT: NEGATIVE
TZST-0002FASTVT: NEGATIVE
TZST-0003SLOWVT: 35 J
TZST-0003SLOWVT: 35 J
TZST-0003SLOWVT: 35 J

## 2012-11-25 DIAGNOSIS — I10 Essential (primary) hypertension: Secondary | ICD-10-CM | POA: Diagnosis not present

## 2012-11-25 DIAGNOSIS — Z79899 Other long term (current) drug therapy: Secondary | ICD-10-CM | POA: Diagnosis not present

## 2012-11-25 DIAGNOSIS — R7309 Other abnormal glucose: Secondary | ICD-10-CM | POA: Diagnosis not present

## 2012-11-25 DIAGNOSIS — E559 Vitamin D deficiency, unspecified: Secondary | ICD-10-CM | POA: Diagnosis not present

## 2012-11-25 DIAGNOSIS — E782 Mixed hyperlipidemia: Secondary | ICD-10-CM | POA: Diagnosis not present

## 2012-12-10 DIAGNOSIS — Z23 Encounter for immunization: Secondary | ICD-10-CM | POA: Diagnosis not present

## 2012-12-17 DIAGNOSIS — L259 Unspecified contact dermatitis, unspecified cause: Secondary | ICD-10-CM | POA: Diagnosis not present

## 2012-12-17 DIAGNOSIS — L820 Inflamed seborrheic keratosis: Secondary | ICD-10-CM | POA: Diagnosis not present

## 2012-12-17 DIAGNOSIS — L240 Irritant contact dermatitis due to detergents: Secondary | ICD-10-CM | POA: Diagnosis not present

## 2012-12-17 DIAGNOSIS — L821 Other seborrheic keratosis: Secondary | ICD-10-CM | POA: Diagnosis not present

## 2012-12-22 ENCOUNTER — Encounter: Payer: Self-pay | Admitting: *Deleted

## 2013-01-07 ENCOUNTER — Encounter: Payer: Self-pay | Admitting: Internal Medicine

## 2013-02-01 ENCOUNTER — Encounter: Payer: Self-pay | Admitting: Internal Medicine

## 2013-02-01 ENCOUNTER — Ambulatory Visit (INDEPENDENT_AMBULATORY_CARE_PROVIDER_SITE_OTHER): Payer: Medicare Other | Admitting: Internal Medicine

## 2013-02-01 VITALS — BP 120/63 | HR 71 | Ht 72.0 in | Wt 146.1 lb

## 2013-02-01 DIAGNOSIS — I5022 Chronic systolic (congestive) heart failure: Secondary | ICD-10-CM

## 2013-02-01 DIAGNOSIS — I2589 Other forms of chronic ischemic heart disease: Secondary | ICD-10-CM | POA: Diagnosis not present

## 2013-02-01 DIAGNOSIS — Z95810 Presence of automatic (implantable) cardiac defibrillator: Secondary | ICD-10-CM

## 2013-02-01 DIAGNOSIS — I255 Ischemic cardiomyopathy: Secondary | ICD-10-CM

## 2013-02-01 LAB — MDC_IDC_ENUM_SESS_TYPE_INCLINIC
Battery Voltage: 3.05 V
Brady Statistic AP VP Percent: 0 %
Brady Statistic RV Percent Paced: 0 %
Date Time Interrogation Session: 20141202115646
Lead Channel Impedance Value: 494 Ohm
Lead Channel Pacing Threshold Amplitude: 0.75 V
Lead Channel Pacing Threshold Pulse Width: 0.4 ms
Lead Channel Pacing Threshold Pulse Width: 0.4 ms
Lead Channel Sensing Intrinsic Amplitude: 2.375 mV
Lead Channel Setting Pacing Amplitude: 2.5 V
Zone Setting Detection Interval: 270 ms
Zone Setting Detection Interval: 350 ms

## 2013-02-01 NOTE — Assessment & Plan Note (Signed)
stable °

## 2013-02-01 NOTE — Assessment & Plan Note (Signed)
The patient's device was interrogated.  The information was reviewed. No changes were made in the programming.    

## 2013-02-01 NOTE — Progress Notes (Signed)
Patient Care Team: Lucky Cowboy, MD as PCP - General (Internal Medicine) Lucky Cowboy, MD (Internal Medicine)   HPI  Richard Moody is a 79 y.o. male  seen in followup for ICD implanted in 2010.  He has a history of ischemic heart disease with prior bypass surgery depressed left ventricular function with EFof 32%. His chronic systolic heart failure but has done relatively well.  He denies chest pain or peripheral edema;  He is not very active.  Past Medical History  Diagnosis Date  . Ischemic cardiomyopathy     EF 32%  . OA (osteoarthritis) of knee   . Ganglion cyst of wrist     LEFT WRIST  . MI, old     INFERIOR LATERAL  . Hyperlipidemia   . GERD (gastroesophageal reflux disease)   . ICD (implantable cardiac defibrillator) in place August 2010  . Hx of CABG 1992  . S/P cardiac cath 2006    Occluded SVG to OM. Managed medically  . Normal nuclear stress test June 2010    No ischemia. EF 32%    Past Surgical History  Procedure Laterality Date  . Cardiac catheterization  10/14/2004    THERE IS POSTERIOR LATERAL HYPOKINESIA. EF 30-35%  . Coronary artery bypass graft  1992  . Knee surgery      RIGHT KNEE  . Cardiac defibrillator placement  10/2008  . Inguinal hernia repair    . Cardiac catheterization  2006    OCCLUDED SVG TO OM  . Nuclear stress test  2010    EF 32%. No ischemia    Current Outpatient Prescriptions  Medication Sig Dispense Refill  . ALPRAZolam (XANAX) 1 MG tablet Take 1.5 mg by mouth 2 (two) times daily.       Marland Kitchen aspirin 81 MG tablet Take 81 mg by mouth 3 (three) times a week.        . B Complex-C (SUPER B COMPLEX PO) Take 1 tablet by mouth daily.        . Cholecalciferol (VITAMIN D3) 5000 UNITS CAPS Take 2 capsules by mouth daily.        Marland Kitchen darifenacin (ENABLEX) 7.5 MG 24 hr tablet Take 7.5 mg by mouth daily.       Marland Kitchen esomeprazole (NEXIUM) 40 MG capsule Take 40 mg by mouth daily before breakfast.        . fexofenadine (ALLEGRA) 180 MG  tablet Take 180 mg by mouth daily.        . fish oil-omega-3 fatty acids 1000 MG capsule Take 2 g by mouth daily.        . fludrocortisone (FLORINEF) 0.1 MG tablet Take 0.1 mg by mouth daily.        . furosemide (LASIX) 40 MG tablet Take 40 mg by mouth 2 (two) times daily.        Marland Kitchen glucosamine-chondroitin 500-400 MG tablet Take 1 tablet by mouth 2 (two) times daily.        Marland Kitchen HYDROcodone-acetaminophen (NORCO) 5-325 MG per tablet Take 1 tablet by mouth every 6 (six) hours as needed.        . IRON PO Take 65 mg by mouth. 3 x a week       . Magnesium 250 MG TABS Take 400 tablets by mouth daily. 2 DAILY      . metoprolol succinate (TOPROL-XL) 50 MG 24 hr tablet Take 1/2 tablet by mouth once daily      . Multiple Minerals-Vitamins (CITRACAL  PLUS PO) Take 500 capsules by mouth 2 (two) times daily.        . NON FORMULARY Halo beta a sol cream .05% 2-4- x per week as needed       . polyethylene glycol (MIRALAX / GLYCOLAX) packet Take 17 g by mouth as needed.        . quinapril (ACCUPRIL) 40 MG tablet Take 1/2 tablet by mouth once daily      . Red Yeast Rice Extract (RED YEAST RICE PO) Take by mouth daily. 2 DAILY       . vitamin C (ASCORBIC ACID) 500 MG tablet Take 1,000 mg by mouth daily.         No current facility-administered medications for this visit.    No Known Allergies  Review of Systems negative except from HPI and PMH  Physical Exam Ht 6' (1.829 m)  Wt 146 lb 1.9 oz (66.28 kg)  BMI 19.81 kg/m2 Well developed and cachectic older man in no acute distress HENT normal Neck supple with JVP-flat Clear Device pocket well healed; without hematoma or erythema.  There is no tethering  Regular rate and rhythm, no murmurs or gallops Abd-soft with active BS No Clubbing cyanosis edema Skin-warm and dry A & Oriented  Grossly normal sensory and motor function  ECG Apacing  Prior IMI LVH repol   Assessment and  Plan

## 2013-02-01 NOTE — Patient Instructions (Signed)
Your physician recommends that you continue on your current medications as directed. Please refer to the Current Medication list given to you today.  Remote monitoring is used to monitor your Pacemaker of ICD from home. This monitoring reduces the number of office visits required to check your device to one time per year. It allows us to keep an eye on the functioning of your device to ensure it is working properly. You are scheduled for a device check from home on 05/04/2013. You may send your transmission at any time that day. If you have a wireless device, the transmission will be sent automatically. After your physician reviews your transmission, you will receive a postcard with your next transmission date.  Your physician wants you to follow-up in: one year with Dr. Klein.  You will receive a reminder letter in the mail two months in advance. If you don't receive a letter, please call our office to schedule the follow-up appointment.    

## 2013-02-01 NOTE — Assessment & Plan Note (Signed)
Stable

## 2013-02-10 ENCOUNTER — Other Ambulatory Visit: Payer: Self-pay | Admitting: Internal Medicine

## 2013-02-22 ENCOUNTER — Encounter: Payer: Self-pay | Admitting: Internal Medicine

## 2013-02-27 DIAGNOSIS — E559 Vitamin D deficiency, unspecified: Secondary | ICD-10-CM | POA: Insufficient documentation

## 2013-02-27 DIAGNOSIS — R7303 Prediabetes: Secondary | ICD-10-CM | POA: Insufficient documentation

## 2013-02-27 DIAGNOSIS — I10 Essential (primary) hypertension: Secondary | ICD-10-CM | POA: Insufficient documentation

## 2013-02-27 DIAGNOSIS — J449 Chronic obstructive pulmonary disease, unspecified: Secondary | ICD-10-CM | POA: Insufficient documentation

## 2013-02-27 NOTE — Progress Notes (Signed)
Patient ID: Richard Moody, male   DOB: 1928-02-26, 79 y.o.   MRN: [id number]   This very nice 79 y.o. MWM presents for 3 month follow up with Hypertension, ASCAD/CABG(1992) with AICD(2010 per Dr Graciela Husbands), Hyperlipidemia, COPD,  Pre-Diabetes and Vitamin D Deficiency.     Patient's HTN predates since 2003. BP has been controlled at home. Today's BP: 102/52 mmHg .  Patient has ASCAD and had a CABG in 1992 and an ICD (10/2008). Patient has Hx/o congestive cardiomyopathy with EF of 35%. Patient was seen earlier today by Dr Graciela Husbands for routine F/U. Patient denies any cardiac type chest pain, palpitations, dyspnea/orthopnea/PND, dizziness, claudication, or dependent edema. Other problems include hyporeninemic hypoaldosteronism for which he is on g Florinef controlling his hyperkalemia.   Hyperlipidemia is controlled with diet & meds. Last Cholesterol was  198, Triglycerides were 73, HDL 73 and LDL 110 - sl above goal with Hx/o of statin intolerance or allergy. Patient denies myalgias or other med SE's.    Also, the patient has history of PreDiabetes since 2011 with A1c 5.8% with last A1c of 5.7% in Sept. Patient denies any symptoms of reactive hypoglycemia, diabetic polys, paresthesias or visual blurring.   Further, Patient has history of Vitamin D Deficiency since 2008 with level of 22 and with last vitamin D of 98 in Sept. Patient supplements vitamin D without any suspected side-effects.  Medication Sig Dispense Refill  . aspirin 81 MG tablet Take 81 mg by mouth 3 (three) times a week.        . B Complex-C (SUPER B COMPLEX PO) Take 1 tablet by mouth daily.        . Cholecalciferol (VITAMIN D3) 5000 UNITS CAPS Take 2 capsules by mouth daily.        Marland Kitchen darifenacin (ENABLEX) 7.5 MG 24 hr tablet Take 7.5 mg by mouth daily.       Marland Kitchen esomeprazole (NEXIUM) 40 MG capsule Take 40 mg by mouth daily before breakfast.        . fexofenadine (ALLEGRA) 180 MG tablet Take 180 mg by mouth daily.        . fish oil-omega-3  fatty acids 1000 MG capsule Take 2 g by mouth daily.        . fludrocortisone (FLORINEF) 0.1 MG tablet Take 0.1 mg by mouth daily.        . furosemide (LASIX) 40 MG tablet TAKE 1 TABLET TWICE A DAY  FOR BLOOD PRESSURE AND     FLUID  180 tablet  1  . glucosamine-chondroitin 500-400 MG tablet Take 1 tablet by mouth 2 (two) times daily.        . IRON PO Take 45 mg by mouth daily.       . Magnesium 250 MG TABS Take 400 tablets by mouth daily. 2 DAILY      . metoprolol succinate (TOPROL-XL) 25 MG 24 hr tablet Take 25 mg by mouth daily.      . Multiple Minerals-Vitamins (CITRACAL PLUS PO) Take 500 capsules by mouth 2 (two) times daily.        . NON FORMULARY Halo beta a sol cream .05% 2-4- x per week as needed       . polyethylene glycol (MIRALAX / GLYCOLAX) packet Take 17 g by mouth as needed.        . quinapril (ACCUPRIL) 20 MG tablet Take 20 mg by mouth daily.      . Red Yeast Rice Extract (RED YEAST RICE PO) Take by  mouth daily. 2 DAILY       . triamcinolone cream (KENALOG) 0.1 % Apply 1 application topically 2 (two) times daily.      . vitamin C (ASCORBIC ACID) 500 MG tablet Take 1,000 mg by mouth daily.           Allergies  Allergen Reactions  . Lipitor [Atorvastatin]     Elevated cpk   . Mobic [Meloxicam] Other (See Comments)    edema  . Nsaids Other (See Comments)    dyspepsia    PMHx:   Past Medical History  Diagnosis Date  . Ischemic cardiomyopathy     EF 32%  . OA (osteoarthritis) of knee   . Ganglion cyst of wrist     LEFT WRIST  . MI, old     INFERIOR LATERAL  . Hyperlipidemia   . GERD (gastroesophageal reflux disease)   . ICD (implantable cardiac defibrillator) in place August 2010  . Hx of CABG 1992  . S/P cardiac cath 2006    Occluded SVG to OM. Managed medically  . Normal nuclear stress test June 2010    No ischemia. EF 32%  . Vitamin D deficiency   . Pre-diabetes     FHx:    Reviewed / unchanged  SHx:    Reviewed / unchanged  Systems  Review: Constitutional: Denies fever, chills, wt changes, headaches, insomnia, fatigue, night sweats, change in appetite. Eyes: Denies redness, blurred vision, diplopia, discharge, itchy, watery eyes.  ENT: Denies discharge, congestion, post nasal drip, epistaxis, sore throat, earache, hearing loss, dental pain, tinnitus, vertigo, sinus pain, snoring.  CV: Denies chest pain, palpitations, irregular heartbeat, syncope, dyspnea, diaphoresis, orthopnea, PND, claudication, edema. Respiratory: denies cough, dyspnea, DOE, pleurisy, hoarseness, laryngitis, wheezing.  Gastrointestinal: Denies dysphagia, odynophagia, heartburn, reflux, water brash, abdominal pain or cramps, nausea, vomiting, bloating, diarrhea, constipation, hematemesis, melena, hematochezia,  or hemorrhoids. Genitourinary: Denies dysuria, frequency, urgency, nocturia, hesitancy, discharge, hematuria, flank pain. Musculoskeletal: Denies arthralgias, myalgias, stiffness, jt. swelling, pain, limp, strain/sprain.  Skin: Denies pruritus, rash, hives, warts, acne, eczema, change in skin lesion(s). Neuro: No weakness, tremor, incoordination, spasms, paresthesia, or pain. Psychiatric: Denies confusion, memory loss, or sensory loss. Endo: Denies change in weight, skin, hair change.  Heme/Lymph: No excessive bleeding, bruising, orenlarged lymph nodes.  BP: 102/52  Pulse: 60  Temp: 98.1 F (36.7 C)  Resp: 18    Estimated body mass index is 19.91 kg/(m^2) as calculated from the following:   Height as of 02/01/13: 6' (1.829 m).   Weight as of this encounter: 146 lb 12.8 oz (66.588 kg).  On Exam: Appears well nourished - in no distress. Eyes: PERRLA, EOMs, conjunctiva no swelling or erythema. Sinuses: No frontal/maxillary tenderness ENT/Mouth: EAC's clear, TM's nl w/o erythema, bulging. Nares clear w/o erythema, swelling, exudates. Oropharynx clear without erythema or exudates. Oral hygiene is good. Tongue normal, non obstructing. Hearing  intact.  Neck: Supple. Thyroid nl. Car 2+/2+ without bruits, nodes or JVD. Chest: Median sternotomy scar. Mild kyphosis and barrel configured. Respirations nl/decreased with BS clear, distant & equal w/o rales, rhonchi, wheezing or stridor.  Cor: Heart sounds normal w/ regular rate and rhythm without sig. murmurs, gallops, clicks, or rubs. Peripheral pulses normal and equal  without edema.  Abdomen: Soft & bowel sounds normal. Non-tender w/o guarding, rebound, hernias, masses, or organomegaly.  Lymphatics: Unremarkable.  Musculoskeletal: Generalized decrease in muscle power, tone and bulk consistant with age and deconditioning. Full ROM all peripheral extremities, joint stability, 5/5 strength, and normal gait.  Skin: Warm, dry without exposed rashes, lesions, ecchymosis apparent.  Neuro: Cranial nerves intact, reflexes equal bilaterally. Sensory-motor testing grossly intact. Tendon reflexes grossly intact.  Pysch: Alert & oriented x 3. Insight and judgement nl & appropriate. No ideations.  Assessment and Plan:  1. Hypertension  & ASCAD/CABG w/ AICD - Continue monitor blood pressure at home. Continue diet/meds same.  2. Hyperlipidemia - Continue diet/meds, exercise,& lifestyle modifications. Continue monitor periodic cholesterol/liver & renal functions   3. Pre-diabetes/Insulin Resistance - Continue diet, exercise, lifestyle modifications. Monitor appropriate labs.  4. Vitamin D Deficiency - Continue supplementation.  5. COPD  Recommended regular exercise, BP monitoring, weight control, and discussed med and SE's. Recommended labs to assess and monitor clinical status. Further disposition pending results of labs.

## 2013-02-27 NOTE — Patient Instructions (Signed)

## 2013-02-28 ENCOUNTER — Ambulatory Visit (INDEPENDENT_AMBULATORY_CARE_PROVIDER_SITE_OTHER): Payer: Medicare Other | Admitting: Internal Medicine

## 2013-02-28 ENCOUNTER — Encounter: Payer: Self-pay | Admitting: Internal Medicine

## 2013-02-28 VITALS — BP 102/52 | HR 60 | Temp 98.1°F | Resp 18 | Wt 146.8 lb

## 2013-02-28 DIAGNOSIS — G894 Chronic pain syndrome: Secondary | ICD-10-CM

## 2013-02-28 DIAGNOSIS — E559 Vitamin D deficiency, unspecified: Secondary | ICD-10-CM | POA: Diagnosis not present

## 2013-02-28 DIAGNOSIS — R7309 Other abnormal glucose: Secondary | ICD-10-CM

## 2013-02-28 DIAGNOSIS — J449 Chronic obstructive pulmonary disease, unspecified: Secondary | ICD-10-CM

## 2013-02-28 DIAGNOSIS — E782 Mixed hyperlipidemia: Secondary | ICD-10-CM | POA: Diagnosis not present

## 2013-02-28 DIAGNOSIS — Z79899 Other long term (current) drug therapy: Secondary | ICD-10-CM

## 2013-02-28 DIAGNOSIS — I10 Essential (primary) hypertension: Secondary | ICD-10-CM

## 2013-02-28 LAB — CBC WITH DIFFERENTIAL/PLATELET
Basophils Absolute: 0 10*3/uL (ref 0.0–0.1)
Eosinophils Absolute: 0.1 10*3/uL (ref 0.0–0.7)
Eosinophils Relative: 2 % (ref 0–5)
HCT: 34.7 % — ABNORMAL LOW (ref 39.0–52.0)
Lymphocytes Relative: 21 % (ref 12–46)
MCH: 32.1 pg (ref 26.0–34.0)
MCHC: 34.9 g/dL (ref 30.0–36.0)
MCV: 92 fL (ref 78.0–100.0)
Monocytes Absolute: 0.6 10*3/uL (ref 0.1–1.0)
Neutrophils Relative %: 66 % (ref 43–77)
RDW: 13.2 % (ref 11.5–15.5)
WBC: 6.2 10*3/uL (ref 4.0–10.5)

## 2013-02-28 MED ORDER — ALPRAZOLAM 1 MG PO TABS: ORAL_TABLET | ORAL | Status: DC

## 2013-02-28 MED ORDER — HYDROCODONE-ACETAMINOPHEN 5-325 MG PO TABS: 1.0000 | ORAL_TABLET | Freq: Four times a day (QID) | ORAL | Status: DC | PRN

## 2013-03-01 LAB — HEPATIC FUNCTION PANEL
ALT: 14 U/L (ref 0–53)
AST: 18 U/L (ref 0–37)
Albumin: 4 g/dL (ref 3.5–5.2)
Alkaline Phosphatase: 77 U/L (ref 39–117)
Bilirubin, Direct: 0.1 mg/dL (ref 0.0–0.3)
Indirect Bilirubin: 0.6 mg/dL (ref 0.0–0.9)
Total Bilirubin: 0.7 mg/dL (ref 0.3–1.2)
Total Protein: 6.5 g/dL (ref 6.0–8.3)

## 2013-03-01 LAB — BASIC METABOLIC PANEL WITH GFR
BUN: 24 mg/dL — ABNORMAL HIGH (ref 6–23)
CO2: 33 mEq/L — ABNORMAL HIGH (ref 19–32)
Calcium: 9.4 mg/dL (ref 8.4–10.5)
Chloride: 99 mEq/L (ref 96–112)
Creat: 0.89 mg/dL (ref 0.50–1.35)
GFR, Est African American: >89 mL/min
Sodium: 138 mEq/L (ref 135–145)

## 2013-03-01 LAB — LIPID PANEL
Cholesterol: 171 mg/dL (ref 0–200)
LDL Cholesterol: 96 mg/dL (ref 0–99)

## 2013-03-01 LAB — INSULIN, FASTING: Insulin fasting, serum: 6 u[IU]/mL (ref 3–28)

## 2013-03-01 LAB — TSH: TSH: 1.237 u[IU]/mL (ref 0.350–4.500)

## 2013-03-01 LAB — HEMOGLOBIN A1C: Mean Plasma Glucose: 123 mg/dL — ABNORMAL HIGH (ref <117)

## 2013-04-12 DIAGNOSIS — H02839 Dermatochalasis of unspecified eye, unspecified eyelid: Secondary | ICD-10-CM | POA: Diagnosis not present

## 2013-04-12 DIAGNOSIS — Z961 Presence of intraocular lens: Secondary | ICD-10-CM | POA: Diagnosis not present

## 2013-04-12 DIAGNOSIS — H40019 Open angle with borderline findings, low risk, unspecified eye: Secondary | ICD-10-CM | POA: Diagnosis not present

## 2013-05-04 ENCOUNTER — Ambulatory Visit (INDEPENDENT_AMBULATORY_CARE_PROVIDER_SITE_OTHER): Payer: Medicare Other | Admitting: *Deleted

## 2013-05-04 DIAGNOSIS — I5022 Chronic systolic (congestive) heart failure: Secondary | ICD-10-CM

## 2013-05-04 DIAGNOSIS — I428 Other cardiomyopathies: Secondary | ICD-10-CM

## 2013-05-08 LAB — MDC_IDC_ENUM_SESS_TYPE_REMOTE
Brady Statistic AP VS Percent: 54.31 %
Brady Statistic AS VP Percent: 0 %
Brady Statistic AS VS Percent: 45.69 %
Brady Statistic RA Percent Paced: 54.31 %
Brady Statistic RV Percent Paced: 0 %
HIGH POWER IMPEDANCE MEASURED VALUE: 42 Ohm
HighPow Impedance: 55 Ohm
Lead Channel Impedance Value: 437 Ohm
Lead Channel Impedance Value: 475 Ohm
Lead Channel Sensing Intrinsic Amplitude: 10.25 mV
Lead Channel Sensing Intrinsic Amplitude: 2 mV
Lead Channel Setting Pacing Pulse Width: 0.4 ms
MDC IDC MSMT BATTERY VOLTAGE: 3.01 V
MDC IDC SESS DTM: 20150304180447
MDC IDC SET LEADCHNL RA PACING AMPLITUDE: 2 V
MDC IDC SET LEADCHNL RV PACING AMPLITUDE: 2.5 V
MDC IDC SET LEADCHNL RV SENSING SENSITIVITY: 0.3 mV
MDC IDC SET ZONE DETECTION INTERVAL: 350 ms
MDC IDC SET ZONE DETECTION INTERVAL: 400 ms
MDC IDC STAT BRADY AP VP PERCENT: 0 %
Zone Setting Detection Interval: 270 ms
Zone Setting Detection Interval: 320 ms

## 2013-05-30 ENCOUNTER — Ambulatory Visit: Payer: Self-pay | Admitting: Emergency Medicine

## 2013-06-01 ENCOUNTER — Other Ambulatory Visit: Payer: Self-pay | Admitting: Emergency Medicine

## 2013-06-01 ENCOUNTER — Encounter: Payer: Self-pay | Admitting: *Deleted

## 2013-06-01 ENCOUNTER — Ambulatory Visit (INDEPENDENT_AMBULATORY_CARE_PROVIDER_SITE_OTHER): Payer: Medicare Other | Admitting: Emergency Medicine

## 2013-06-01 ENCOUNTER — Encounter: Payer: Self-pay | Admitting: Emergency Medicine

## 2013-06-01 VITALS — BP 106/58 | HR 64 | Temp 98.4°F | Resp 16 | Ht 71.0 in | Wt 146.0 lb

## 2013-06-01 DIAGNOSIS — E782 Mixed hyperlipidemia: Secondary | ICD-10-CM | POA: Diagnosis not present

## 2013-06-01 DIAGNOSIS — G894 Chronic pain syndrome: Secondary | ICD-10-CM

## 2013-06-01 DIAGNOSIS — Z0000 Encounter for general adult medical examination without abnormal findings: Secondary | ICD-10-CM

## 2013-06-01 DIAGNOSIS — Z23 Encounter for immunization: Secondary | ICD-10-CM | POA: Diagnosis not present

## 2013-06-01 DIAGNOSIS — Z1331 Encounter for screening for depression: Secondary | ICD-10-CM

## 2013-06-01 DIAGNOSIS — I10 Essential (primary) hypertension: Secondary | ICD-10-CM

## 2013-06-01 DIAGNOSIS — D649 Anemia, unspecified: Secondary | ICD-10-CM | POA: Diagnosis not present

## 2013-06-01 DIAGNOSIS — R7309 Other abnormal glucose: Secondary | ICD-10-CM

## 2013-06-01 DIAGNOSIS — E559 Vitamin D deficiency, unspecified: Secondary | ICD-10-CM

## 2013-06-01 DIAGNOSIS — R32 Unspecified urinary incontinence: Secondary | ICD-10-CM | POA: Diagnosis not present

## 2013-06-01 DIAGNOSIS — Z789 Other specified health status: Secondary | ICD-10-CM

## 2013-06-01 DIAGNOSIS — E538 Deficiency of other specified B group vitamins: Secondary | ICD-10-CM | POA: Diagnosis not present

## 2013-06-01 DIAGNOSIS — R609 Edema, unspecified: Secondary | ICD-10-CM

## 2013-06-01 LAB — CBC WITH DIFFERENTIAL/PLATELET
Basophils Absolute: 0.1 10*3/uL (ref 0.0–0.1)
Basophils Relative: 1 % (ref 0–1)
EOS ABS: 0.2 10*3/uL (ref 0.0–0.7)
Eosinophils Relative: 3 % (ref 0–5)
HCT: 34.5 % — ABNORMAL LOW (ref 39.0–52.0)
Hemoglobin: 12 g/dL — ABNORMAL LOW (ref 13.0–17.0)
Lymphocytes Relative: 25 % (ref 12–46)
Lymphs Abs: 1.3 10*3/uL (ref 0.7–4.0)
MCH: 31.8 pg (ref 26.0–34.0)
MCHC: 34.8 g/dL (ref 30.0–36.0)
MCV: 91.5 fL (ref 78.0–100.0)
Monocytes Absolute: 0.5 10*3/uL (ref 0.1–1.0)
Monocytes Relative: 10 % (ref 3–12)
NEUTROS ABS: 3.2 10*3/uL (ref 1.7–7.7)
NEUTROS PCT: 61 % (ref 43–77)
Platelets: 231 10*3/uL (ref 150–400)
RBC: 3.77 MIL/uL — ABNORMAL LOW (ref 4.22–5.81)
RDW: 13 % (ref 11.5–15.5)
WBC: 5.2 10*3/uL (ref 4.0–10.5)

## 2013-06-01 MED ORDER — HYDROCODONE-ACETAMINOPHEN 5-325 MG PO TABS: 1.0000 | ORAL_TABLET | Freq: Four times a day (QID) | ORAL | Status: DC | PRN

## 2013-06-01 NOTE — Progress Notes (Signed)
Patient ID: Richard Moody, male   DOB: [birth year], 80 y.o.   MRN: [id number] Subjective:  Richard Moody is a 80 y.o. male who presents for Medicare Annual Wellness Visit and 3 month follow up for HTN, hyperlipidemia, prediabete, and vitamin D Def.  Date of last medicare wellness visit was is unknown.  He notes it is hard to exercise due to left knee pain and walks with cain. He does try to do chair exercises. He is concerned about weight loss but weight has remained stable over last year. He denies skipping meals.   His blood pressure has been controlled at home, today their BP is BP: 106/58 mmHg He does not workout. He denies chest pain, shortness of breath, dizziness.  He is not on cholesterol medication and denies myalgias. His cholesterol is at goal. The cholesterol last visit was:   Lab Results  Component Value Date   CHOL 156 06/01/2013   HDL 66 06/01/2013   LDLCALC 72 06/01/2013   TRIG 90 06/01/2013   CHOLHDL 2.4 06/01/2013   He has been working on diet and exercise for prediabetes, and denies foot ulcerations, increased appetite, nausea, paresthesia of the feet and visual disturbances. Last A1C in the office was:  Lab Results  Component Value Date   HGBA1C 5.7* 06/01/2013   Patient is on Vitamin D supplement.   No components found with this basename: VITD25OH     Names of Other Physician/Practitioners you currently use: Patient Care Team: Unk Pinto, MD as PCP - General (Internal Medicine) Unk Pinto, MD (Internal Medicine) Darleen Crocker, MD as Consulting Physician (Ophthalmology) Deboraha Sprang, MD as Consulting Physician (Cardiology) J. Tye Savoy, (Dentist)  Medication Review: Current Outpatient Prescriptions on File Prior to Visit  Medication Sig Dispense Refill  . ALPRAZolam (XANAX) 1 MG tablet 1/2 to 1 tablet 3 x daily as needed for anxiety or sleep  270 tablet  1  . aspirin 81 MG tablet Take 81 mg by mouth 3 (three) times a week.        . Cholecalciferol  (VITAMIN D3) 5000 UNITS CAPS Take 2 capsules by mouth daily.        Marland Kitchen darifenacin (ENABLEX) 7.5 MG 24 hr tablet Take 7.5 mg by mouth daily.       Marland Kitchen esomeprazole (NEXIUM) 40 MG capsule Take 40 mg by mouth daily before breakfast.        . fexofenadine (ALLEGRA) 180 MG tablet Take 180 mg by mouth daily.        . fish oil-omega-3 fatty acids 1000 MG capsule Take 2 g by mouth daily.        . fludrocortisone (FLORINEF) 0.1 MG tablet Take 0.1 mg by mouth daily.        . furosemide (LASIX) 40 MG tablet TAKE 1 TABLET TWICE A DAY  FOR BLOOD PRESSURE AND     FLUID  180 tablet  1  . glucosamine-chondroitin 500-400 MG tablet Take 1 tablet by mouth 2 (two) times daily.        . IRON PO Take 45 mg by mouth daily.       . Magnesium 250 MG TABS Take 400 tablets by mouth daily. 2 DAILY      . metoprolol succinate (TOPROL-XL) 25 MG 24 hr tablet Take 25 mg by mouth daily.      . Multiple Minerals-Vitamins (CITRACAL PLUS PO) Take 500 capsules by mouth 2 (two) times daily.        . NON FORMULARY Halo  beta a sol cream .05% 2-4- x per week as needed       . polyethylene glycol (MIRALAX / GLYCOLAX) packet Take 17 g by mouth as needed.        . quinapril (ACCUPRIL) 20 MG tablet Take 20 mg by mouth daily.      . Red Yeast Rice Extract (RED YEAST RICE PO) Take by mouth daily. 2 DAILY       . triamcinolone cream (KENALOG) 0.1 % Apply 1 application topically 2 (two) times daily.      . vitamin C (ASCORBIC ACID) 500 MG tablet Take 1,000 mg by mouth daily.         No current facility-administered medications on file prior to visit.   Allergies  Allergen Reactions  . Lipitor [Atorvastatin]     Elevated cpk   . Mobic [Meloxicam] Other (See Comments)    edema  . Nsaids Other (See Comments)    dyspepsia    Current Problems (verified) Patient Active Problem List   Diagnosis Date Noted  . Unspecified essential hypertension 02/27/2013  . Other abnormal glucose 02/27/2013  . Unspecified vitamin D deficiency 02/27/2013   . Obstructive chronic bronchitis without exacerbation 02/27/2013  . SYSTOLIC HEART FAILURE, CHRONIC 04/08/2010  . Implantable defibrillator-Medtronic-dual-chamber 04/08/2010  . Ischemic cardiomyopathy s/p CABG 1990s 09/29/2008  . HYPERLIPIDEMIA 09/06/2008    Screening Tests Health Maintenance  Topic Date Due  . Tetanus/tdap  11/04/1948  . Colonoscopy  11/05/1979  . Zostavax  11/04/1989  . Influenza Vaccine  10/01/2013  . Pneumococcal Polysaccharide Vaccine Age 74 And Over  Completed    Immunization History  Administered Date(s) Administered  . Influenza-Unspecified 01/01/2013  . Pneumococcal Polysaccharide-23 07/02/2001, 06/01/2013  . Tdap 03/04/2003    Preventative care: Last colonoscopy: 2006 CXR 2013  Prior vaccinations: TD or Tdap: 2005  Influenza:01/2013  Pneumococcal: 2003/ 2015 Shingles/Zostavax: Declines due to cost  History reviewed:  Past Medical History  Diagnosis Date  . Ischemic cardiomyopathy     EF 32%  . OA (osteoarthritis) of knee   . Ganglion cyst of wrist     LEFT WRIST  . MI, old     INFERIOR LATERAL  . Hyperlipidemia   . GERD (gastroesophageal reflux disease)   . ICD (implantable cardiac defibrillator) in place August 2010  . Hx of CABG 1992  . S/P cardiac cath 2006    Occluded SVG to OM. Managed medically  . Normal nuclear stress test June 2010    No ischemia. EF 32%  . Vitamin D deficiency   . Pre-diabetes    Past Surgical History  Procedure Laterality Date  . Cardiac catheterization  10/14/2004    THERE IS POSTERIOR LATERAL HYPOKINESIA. EF 30-35%  . Coronary artery bypass graft  1992  . Knee surgery      RIGHT KNEE  . Cardiac defibrillator placement  10/2008  . Inguinal hernia repair    . Cardiac catheterization  2006    OCCLUDED SVG TO OM  . Nuclear stress test  2010    EF 32%. No ischemia   History  Substance Use Topics  . Smoking status: Former Smoker    Quit date: 03/03/1982  . Smokeless tobacco: Never Used  .  Alcohol Use: No   Family History  Problem Relation Age of Onset  . Cancer Mother     ABDOMINAL CANCER  . Hypertension Father   . Stroke Father   . Cancer Sister     breast   Risk Factors:  Tobacco History  Substance Use Topics  . Smoking status: Former Smoker    Quit date: 03/03/1982  . Smokeless tobacco: Never Used  . Alcohol Use: No   He does not smoke.  Patient is a former smoker. Are there smokers in your home (other than you)?  No  Alcohol Current alcohol use: glass of wine with dinner  Caffeine Current caffeine use: coffee 1 /day  Exercise Current exercise habits: Exercise is limited by orthopedic condition(s): arthritic knee.  Current exercise: gardening, housecleaning, no regular exercise and yard work  Nutrition/Diet Current diet: in general, a "healthy" diet    Cardiac risk factors: advanced age (older than 63 for men, 51 for women), dyslipidemia and sedentary lifestyle.  Depression Screen Nurse depression screen reviewed.  (Note: if answer to either of the following is "Yes", a more complete depression screening is indicated)   Q1: Over the past two weeks, have you felt down, depressed or hopeless? No  Q2: Over the past two weeks, have you felt little interest or pleasure in doing things? No  Have you lost interest or pleasure in daily life? No  Do you often feel hopeless? No  Do you cry easily over simple problems? No  Activities of Daily Living Nurse ADLs screen reviewed.  In your present state of health, do you have any difficulty performing the following activities?:  Driving? No Managing money?  No Feeding yourself? No Getting from bed to chair? No Climbing a flight of stairs? No Preparing food and eating?: No Bathing or showering? No Getting dressed: No Getting to the toilet? No.occasionally with incontinence Using the toilet:No Moving around from place to place: Yes with bad knee In the past year have you fallen or had a near  fall?:No  Are you sexually active?  No  Do you have more than one partner?  No  Vision Difficulties: No  Hearing Difficulties: No Do you often ask people to speak up or repeat themselves? No Do you experience ringing or noises in your ears? No Do you have difficulty understanding soft or whispered voices? No  Cognition  Do you feel that you have a problem with memory?No  Do you often misplace items? No  Do you feel safe at home?  Yes  Advanced directives Does patient have a [place]? Yes, WIFE Does patient have a Living Will? Yes   Objective:     Vision and hearing screens reviewed.   Blood pressure 106/58, pulse 64, temperature 98.4 F (36.9 C), temperature source Temporal, resp. rate 16, height 5\' 11"  (1.803 m), weight 146 lb (66.225 kg). Body mass index is 20.37 kg/(m^2).  General appearance: alert, no distress, WD/WN, male Cognitive Testing  Alert? Yes  Normal Appearance?Yes  Oriented to person? Yes  Place? Yes   Time? Yes  Recall of three objects?  Yes  Can perform simple calculations? Yes  Displays appropriate judgment?Yes  Can read the correct time from a watch face?Yes  HEENT: normocephalic, sclerae anicteric, TMs pearly, nares patent, no discharge or erythema, pharynx normal Oral cavity: MMM, no lesions Neck: supple, no lymphadenopathy, no thyromegaly, no masses Heart: RRR, normal S1, S2, no murmurs Lungs: CTA bilaterally, no wheezes, rhonchi, or rales Abdomen: +bs, soft, non tender, non distended, no masses, no hepatomegaly, no splenomegaly Musculoskeletal: nontender, no swelling, no obvious deformity, + gait disturbance walks with cain, + crepitus L > R knee Extremities: bilateral 2 + LE edema, no cyanosis, no clubbing Skin- dry, exposed area Pulses: 2+  symmetric, upper and lower extremities, normal cap refill Neurological: alert, oriented x 3, CN2-12 intact, strength normal upper extremities and lower extremities, sensation normal  throughout, DTRs 2+ throughout, no cerebellar signs, gait normal Psychiatric: normal affect, behavior normal, pleasant   Assessment:  1. Medicare wellness update- Update screening labs/ History/ Immunizations/ Testing as needed. Advised healthy diet, QD exercise, increase H20 and continue RX/ Vitamins AD.  2.  3 month F/U for HTN, Cholesterol, Pre-Dm, D. Deficient. Needs healthy diet, cardio QD and obtain healthy weight. Check Labs, Check BP if >130/80 call office  3. LE Edema- elevate legs TID, increase activity, increase H2o, decrease sodium intake, Wear compression socks more routinely if available. F/U with results  4. Chronic pain vs arthritis- Advised water exercise, continue current plan AD otherwise, call with any falls  5. Urine incontinence- check labs, hygiene explained, increase H2O decrease caffeine/  acidic/ spicy diet.     Plan:  Also see above  During the course of the visit the patient was educated and counseled about appropriate screening and preventive services including:    Pneumococcal vaccine   Diabetes screening  Nutrition counseling   Advanced directives: has an advanced directive - a copy has been provided  Screening recommendations, referrals: Vaccinations: Tdap vaccine not done  /up to date  Influenza vaccine not don  /up to date  Pneumococcal vaccine not done  /up to date  Shingles vaccine not done  /up to date  Hep B vaccine no  Nutrition assessed and recommended  Colonoscopy no Recommended yearly ophthalmology/optometry visit for glaucoma screening and checkup yes Recommended yearly dental visit for hygiene and checkup yes Advanced directives - yes  Conditions/risks identified: BMI: Discussed weight loss, diet, and increase physical activity.  Increase physical activity: AHA recommends 150 minutes of physical activity a week.  Medications reviewed Diabetes is at goal, ACE/ARB therapy: No, Reason not on Ace Inhibitor/ARB therapy:  Not a  diabetic Urinary Incontinence is an issue: discussed non pharmacology and pharmacology options.  Fall risk: moderate- discussed PT, home fall assessment, medications.    Medicare Attestation I have personally reviewed: The patient's medical and social history Their use of alcohol, tobacco or illicit drugs Their current medications and supplements The patient's functional ability including ADLs,fall risks, home safety risks, cognitive, and hearing and visual impairment Diet and physical activities Evidence for depression or mood disorders  The patient's weight, height, BMI, and visual acuity have been recorded in the chart.  I have made referrals, counseling, and provided education to the patient based on review of the above and I have provided the patient with a written personalized care plan for preventive services.     Kelby Aline, R, PA-C   06/07/2013    CPT X7939 first AWV CPT [phone] subsequent AWV

## 2013-06-01 NOTE — Patient Instructions (Signed)
Edema °Edema is a buildup of fluids. It is most common in the feet, ankles, and legs. This happens more as a person ages. It may affect one or both legs. °HOME CARE  °· Raise (elevate) the legs or ankles above the level of the heart while lying down. °· Avoid sitting or standing still for a long time. °· Exercise the legs to help the puffiness (swelling) go down. °· A low-salt diet may help lessen the puffiness. °· Only take medicine as told by your doctor. °GET HELP RIGHT AWAY IF:  °· You develop shortness of breath or chest pain. °· You cannot breathe when you lie down. °· You have more puffiness that does not go away with treatment. °· You develop pain or redness in the areas that are puffy. °· You have a temperature by mouth above 102° F (38.9° C), not controlled by medicine. °· You gain 03 lb/1.4 kg or more in 1 day or 05 lb/2.3 kg in a week. °MAKE SURE YOU:  °· Understand these instructions. °· Will watch your condition. °· Will get help right away if you are not doing well or get worse. °Document Released: 08/06/2007 Document Revised: 05/12/2011 Document Reviewed: 08/06/2007 °[employer]® Patient Information ©2014 [employer]. ° °

## 2013-06-02 LAB — IRON AND TIBC
%SAT: 12 % — ABNORMAL LOW (ref 20–55)
IRON: 36 ug/dL — AB (ref 42–165)
TIBC: 304 ug/dL (ref 215–435)
UIBC: 268 ug/dL (ref 125–400)

## 2013-06-02 LAB — HEPATIC FUNCTION PANEL
ALK PHOS: 74 U/L (ref 39–117)
ALT: 13 U/L (ref 0–53)
AST: 19 U/L (ref 0–37)
Albumin: 4.1 g/dL (ref 3.5–5.2)
BILIRUBIN INDIRECT: 0.5 mg/dL (ref 0.2–1.2)
BILIRUBIN TOTAL: 0.6 mg/dL (ref 0.2–1.2)
Bilirubin, Direct: 0.1 mg/dL (ref 0.0–0.3)
Total Protein: 6.3 g/dL (ref 6.0–8.3)

## 2013-06-02 LAB — URINALYSIS, ROUTINE W REFLEX MICROSCOPIC
Bilirubin Urine: NEGATIVE
GLUCOSE, UA: NEGATIVE mg/dL
HGB URINE DIPSTICK: NEGATIVE
Ketones, ur: NEGATIVE mg/dL
Nitrite: NEGATIVE
Protein, ur: NEGATIVE mg/dL
SPECIFIC GRAVITY, URINE: 1.016 (ref 1.005–1.030)
Urobilinogen, UA: 0.2 mg/dL (ref 0.0–1.0)
pH: 5.5 (ref 5.0–8.0)

## 2013-06-02 LAB — BASIC METABOLIC PANEL WITH GFR
BUN: 24 mg/dL — ABNORMAL HIGH (ref 6–23)
CALCIUM: 9.1 mg/dL (ref 8.4–10.5)
CO2: 28 mEq/L (ref 19–32)
Chloride: 100 mEq/L (ref 96–112)
Creat: 0.85 mg/dL (ref 0.50–1.35)
GFR, Est Non African American: 81 mL/min
Glucose, Bld: 91 mg/dL (ref 70–99)
Potassium: 4.4 mEq/L (ref 3.5–5.3)
SODIUM: 138 meq/L (ref 135–145)

## 2013-06-02 LAB — LIPID PANEL
CHOL/HDL RATIO: 2.4 ratio
Cholesterol: 156 mg/dL (ref 0–200)
HDL: 66 mg/dL (ref >39)
LDL Cholesterol: 72 mg/dL (ref 0–99)
Triglycerides: 90 mg/dL (ref <150)
VLDL: 18 mg/dL (ref 0–40)

## 2013-06-02 LAB — VITAMIN B12: Vitamin B-12: 957 pg/mL — ABNORMAL HIGH (ref 211–911)

## 2013-06-02 LAB — URINALYSIS, MICROSCOPIC ONLY
BACTERIA UA: NONE SEEN
CASTS: NONE SEEN
Crystals: NONE SEEN
Squamous Epithelial / LPF: NONE SEEN

## 2013-06-02 LAB — HEMOGLOBIN A1C
HEMOGLOBIN A1C: 5.7 % — AB (ref <5.7)
MEAN PLASMA GLUCOSE: 117 mg/dL — AB (ref <117)

## 2013-06-02 LAB — MAGNESIUM: Magnesium: 1.8 mg/dL (ref 1.5–2.5)

## 2013-06-03 ENCOUNTER — Other Ambulatory Visit: Payer: Self-pay | Admitting: Emergency Medicine

## 2013-06-03 LAB — URINE CULTURE

## 2013-06-03 MED ORDER — CIPROFLOXACIN HCL 250 MG PO TABS: 250.0000 mg | ORAL_TABLET | Freq: Two times a day (BID) | ORAL | Status: AC

## 2013-06-10 ENCOUNTER — Encounter: Payer: Self-pay | Admitting: Internal Medicine

## 2013-07-05 ENCOUNTER — Ambulatory Visit (INDEPENDENT_AMBULATORY_CARE_PROVIDER_SITE_OTHER): Payer: Medicare Other | Admitting: *Deleted

## 2013-07-05 DIAGNOSIS — N390 Urinary tract infection, site not specified: Secondary | ICD-10-CM | POA: Diagnosis not present

## 2013-07-06 LAB — URINALYSIS, ROUTINE W REFLEX MICROSCOPIC
BILIRUBIN URINE: NEGATIVE
GLUCOSE, UA: NEGATIVE mg/dL
Hgb urine dipstick: NEGATIVE
KETONES UR: NEGATIVE mg/dL
LEUKOCYTES UA: NEGATIVE
Nitrite: NEGATIVE
PH: 6.5 (ref 5.0–8.0)
PROTEIN: NEGATIVE mg/dL
Specific Gravity, Urine: 1.007 (ref 1.005–1.030)
Urobilinogen, UA: 0.2 mg/dL (ref 0.0–1.0)

## 2013-07-06 LAB — URINE CULTURE
Colony Count: NO GROWTH
Organism ID, Bacteria: NO GROWTH

## 2013-08-08 ENCOUNTER — Ambulatory Visit (INDEPENDENT_AMBULATORY_CARE_PROVIDER_SITE_OTHER): Payer: Medicare Other | Admitting: *Deleted

## 2013-08-08 ENCOUNTER — Telehealth: Payer: Self-pay | Admitting: Cardiology

## 2013-08-08 DIAGNOSIS — I255 Ischemic cardiomyopathy: Secondary | ICD-10-CM

## 2013-08-08 DIAGNOSIS — I5022 Chronic systolic (congestive) heart failure: Secondary | ICD-10-CM

## 2013-08-08 DIAGNOSIS — I2589 Other forms of chronic ischemic heart disease: Secondary | ICD-10-CM

## 2013-08-08 LAB — MDC_IDC_ENUM_SESS_TYPE_REMOTE
Brady Statistic AP VP Percent: 0 %
Brady Statistic AS VP Percent: 0 %
Brady Statistic AS VS Percent: 45.06 %
Date Time Interrogation Session: 20150608162220
HIGH POWER IMPEDANCE MEASURED VALUE: 56 Ohm
HighPow Impedance: 44 Ohm
Lead Channel Sensing Intrinsic Amplitude: 10.5 mV
Lead Channel Sensing Intrinsic Amplitude: 10.5 mV
Lead Channel Sensing Intrinsic Amplitude: 2.625 mV
Lead Channel Setting Pacing Amplitude: 2.5 V
MDC IDC MSMT BATTERY VOLTAGE: 3.03 V
MDC IDC MSMT LEADCHNL RA IMPEDANCE VALUE: 494 Ohm
MDC IDC MSMT LEADCHNL RA SENSING INTR AMPL: 2.625 mV
MDC IDC MSMT LEADCHNL RV IMPEDANCE VALUE: 418 Ohm
MDC IDC SET LEADCHNL RA PACING AMPLITUDE: 2 V
MDC IDC SET LEADCHNL RV PACING PULSEWIDTH: 0.4 ms
MDC IDC SET LEADCHNL RV SENSING SENSITIVITY: 0.3 mV
MDC IDC SET ZONE DETECTION INTERVAL: 270 ms
MDC IDC SET ZONE DETECTION INTERVAL: 320 ms
MDC IDC SET ZONE DETECTION INTERVAL: 350 ms
MDC IDC SET ZONE DETECTION INTERVAL: 400 ms
MDC IDC STAT BRADY AP VS PERCENT: 54.94 %
MDC IDC STAT BRADY RA PERCENT PACED: 54.94 %
MDC IDC STAT BRADY RV PERCENT PACED: 0 %

## 2013-08-08 NOTE — Telephone Encounter (Signed)
Spoke with pt and reminded pt of remote transmission that is due today. Pt verbalized understanding.   

## 2013-08-08 NOTE — Progress Notes (Signed)
Remote ICD transmission.   

## 2013-08-09 ENCOUNTER — Other Ambulatory Visit: Payer: Self-pay | Admitting: Internal Medicine

## 2013-08-15 ENCOUNTER — Other Ambulatory Visit: Payer: Self-pay | Admitting: *Deleted

## 2013-08-15 MED ORDER — METOPROLOL SUCCINATE ER 25 MG PO TB24: 25.0000 mg | ORAL_TABLET | Freq: Every day | ORAL | Status: DC

## 2013-08-16 ENCOUNTER — Other Ambulatory Visit: Payer: Self-pay

## 2013-08-16 MED ORDER — METOPROLOL SUCCINATE ER 25 MG PO TB24: 25.0000 mg | ORAL_TABLET | Freq: Every day | ORAL | Status: DC

## 2013-08-29 ENCOUNTER — Encounter: Payer: Self-pay | Admitting: Internal Medicine

## 2013-08-31 ENCOUNTER — Encounter: Payer: Self-pay | Admitting: Cardiology

## 2013-09-05 ENCOUNTER — Encounter: Payer: Self-pay | Admitting: Internal Medicine

## 2013-09-05 ENCOUNTER — Ambulatory Visit (INDEPENDENT_AMBULATORY_CARE_PROVIDER_SITE_OTHER): Payer: Medicare Other | Admitting: Internal Medicine

## 2013-09-05 VITALS — BP 124/70 | HR 72 | Temp 98.1°F | Resp 16 | Ht 70.75 in | Wt 143.6 lb

## 2013-09-05 DIAGNOSIS — Z125 Encounter for screening for malignant neoplasm of prostate: Secondary | ICD-10-CM

## 2013-09-05 DIAGNOSIS — Z789 Other specified health status: Secondary | ICD-10-CM

## 2013-09-05 DIAGNOSIS — I10 Essential (primary) hypertension: Secondary | ICD-10-CM

## 2013-09-05 DIAGNOSIS — Z1331 Encounter for screening for depression: Secondary | ICD-10-CM

## 2013-09-05 DIAGNOSIS — E782 Mixed hyperlipidemia: Secondary | ICD-10-CM | POA: Insufficient documentation

## 2013-09-05 DIAGNOSIS — Z79899 Other long term (current) drug therapy: Secondary | ICD-10-CM

## 2013-09-05 DIAGNOSIS — R7309 Other abnormal glucose: Secondary | ICD-10-CM

## 2013-09-05 DIAGNOSIS — E559 Vitamin D deficiency, unspecified: Secondary | ICD-10-CM

## 2013-09-05 DIAGNOSIS — Z1212 Encounter for screening for malignant neoplasm of rectum: Secondary | ICD-10-CM

## 2013-09-05 LAB — CBC WITH DIFFERENTIAL/PLATELET
Basophils Absolute: 0 10*3/uL (ref 0.0–0.1)
Basophils Relative: 1 % (ref 0–1)
EOS ABS: 0.1 10*3/uL (ref 0.0–0.7)
EOS PCT: 3 % (ref 0–5)
HCT: 33.7 % — ABNORMAL LOW (ref 39.0–52.0)
Hemoglobin: 11.9 g/dL — ABNORMAL LOW (ref 13.0–17.0)
Lymphocytes Relative: 24 % (ref 12–46)
Lymphs Abs: 1.1 10*3/uL (ref 0.7–4.0)
MCH: 32.4 pg (ref 26.0–34.0)
MCHC: 35.3 g/dL (ref 30.0–36.0)
MCV: 91.8 fL (ref 78.0–100.0)
Monocytes Absolute: 0.5 10*3/uL (ref 0.1–1.0)
Monocytes Relative: 10 % (ref 3–12)
Neutro Abs: 2.9 10*3/uL (ref 1.7–7.7)
Neutrophils Relative %: 62 % (ref 43–77)
PLATELETS: 203 10*3/uL (ref 150–400)
RBC: 3.67 MIL/uL — ABNORMAL LOW (ref 4.22–5.81)
RDW: 13.4 % (ref 11.5–15.5)
WBC: 4.7 10*3/uL (ref 4.0–10.5)

## 2013-09-05 LAB — HEMOGLOBIN A1C
Hgb A1c MFr Bld: 5.9 % — ABNORMAL HIGH (ref <5.7)
MEAN PLASMA GLUCOSE: 123 mg/dL — AB (ref <117)

## 2013-09-05 MED ORDER — HYDROCODONE-ACETAMINOPHEN 5-325 MG PO TABS: ORAL_TABLET | ORAL | Status: DC

## 2013-09-05 NOTE — Patient Instructions (Signed)

## 2013-09-05 NOTE — Progress Notes (Signed)
Patient ID: Richard Moody, male   DOB: January 16, 1928, 80 y.o.   MRN: [id number]  Annual Screening Comprehensive Examination  This very nice 80 y.o.MWM presents for complete physical.  Patient has been followed for HTN, ASHD/chCHF, COPD, GERD,  Prediabetes, Hyperlipidemia, and Vitamin D Deficiency.   HTN predates since 2003. Patient's BP has been controlled at home.Today's BP: 124/70 mmHg. Patient has ASCAD - managed medically, Hx/o Ch CHF (EF 32% by Myoview 2010) and AICD/Defibrillator (followed by Dr Caryl Comes).  Patient denies any cardiac symptoms as chest pain, palpitations,dizziness or ankle swelling. He does admit some shortness of breath    Patient's hyperlipidemia is controlled with diet (Off Lipitor due to elev CPK). Patient denies myalgias or other medication SE's. Last cholesterol last visit was at goal  In Apr 2015. Lab Results  Component Value Date   CHOL 156 06/01/2013   HDL 66 06/01/2013   LDLCALC 72 06/01/2013   TRIG 90 06/01/2013   CHOLHDL 2.4 06/01/2013    Patient has prediabetes since    and last A1c was  5.7% in Apr 2015.   Patient denies reactive hypoglycemic symptoms, visual blurring, diabetic polys or paresthesias.    Finally, patient has history of Vitamin D Deficiency of 22 in 2008 and last vitamin D was 98 in Sept 2014.   Medication Sig  . ALPRAZolam 1 MG tab 1/2 to 1 tablet 3 x daily as needed for anxiety or sleep  . aspirin 81 MG tablet Take 81 mg by mouth 3 (three) times a week.    Marland Kitchen VITAMIN D3 5000 UNITS CAPS Take 2 capsules by mouth daily.    . ENABLEX 7.5 MG 24 hr tablet TAKE 1 TABLET EVERY DAY FORBLADDER  . esomeprazole  40 MG capsule Take 40 mg by mouth daily before breakfast.    . fish oil 1000 MG capsule Take 2 g by mouth daily.    . fludrocortisone (FLORINEF) 0.1 MG tablet Take 0.1 mg by mouth daily.    . furosemide  40 MG tablet TAKE 1 TABLET TWICE A DAY  FOR BP & FLUID  . glucosamine-chondroitin 500-400 MG tablet Take 1 tablet by mouth 2 (two) times daily.    .  IRON PO Take 45 mg by mouth daily.   . Magnesium 250 MG TABS Take 400 tablets by mouth daily. 2 DAILY  . metoprolol succinate 25 MG 24 hr tab Take 1 tablet (25 mg total) by mouth daily.  Marland Kitchen CITRACAL PLUS PO) Take 500 capsules by mouth 2 (two) times daily.    . NON FORMULARY Halo beta a sol cream .05% 2-4- x per week as needed   . polyethylene glycol (MIRALAX ) packet Take 17 g by mouth as needed.    . quinapril (ACCUPRIL) 20 MG tablet TAKE 1 TABLET DAILY FOR  BP AND HEART  . Red Yeast Rice Extract  2 DAILY   . triamcinolone cream (KENALOG) 0.1 % Apply 1 application topically 2  times daily.  . vitamin C  500 MG tablet Take 1,000 mg by mouth daily.     Allergies  Allergen Reactions  . Lipitor [Atorvastatin]     Elevated cpk   . Mobic [Meloxicam] Other (See Comments)    edema  . Nsaids Other (See Comments)    dyspepsia   Past Medical History  Diagnosis Date  . Ischemic cardiomyopathy     EF 32%  . OA (osteoarthritis) of knee   . Ganglion cyst of wrist     LEFT  WRIST  . MI, old     INFERIOR LATERAL  . Hyperlipidemia   . GERD (gastroesophageal reflux disease)   . ICD (implantable cardiac defibrillator) in place August 2010  . Hx of CABG 1992  . S/P cardiac cath 2006    Occluded SVG to OM. Managed medically  . Normal nuclear stress test June 2010    No ischemia. EF 32%  . Vitamin D deficiency   . Pre-diabetes    Past Surgical History  Procedure Laterality Date  . Cardiac catheterization  10/14/2004    THERE IS POSTERIOR LATERAL HYPOKINESIA. EF 30-35%  . Coronary artery bypass graft  1992  . Knee surgery      RIGHT KNEE  . Cardiac defibrillator placement  10/2008  . Inguinal hernia repair    . Cardiac catheterization  2006    OCCLUDED SVG TO OM  . Nuclear stress test  2010    EF 32%. No ischemia   Family History  Problem Relation Age of Onset  . Cancer Mother     ABDOMINAL CANCER  . Hypertension Father   . Stroke Father   . Cancer Sister     breast   History    Social History  . Marital Status: Married x 26 yrs    Spouse Name: N/A    Number of Children: N/A  . Years of Education: N/A   Occupational History  . 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.   Social History Main Topics  . Smoking status: Former Smoker    Quit date: 03/03/1982  . Smokeless tobacco: Never Used  . Alcohol Use: No  . Drug Use: No  . Sexual Activity: Not on file     ROS Constitutional: Denies fever, chills, weight loss/gain, headaches, insomnia, fatigue, night sweats or change in appetite. Eyes: Denies redness, blurred vision, diplopia, discharge, itchy or watery eyes.  ENT: Denies discharge, congestion, post nasal drip, epistaxis, sore throat, earache, hearing loss, dental pain, Tinnitus, Vertigo, Sinus pain or snoring.  Cardio: Denies chest pain, palpitations, irregular heartbeat, syncope, dyspnea, diaphoresis, orthopnea, PND, claudication or edema Respiratory: denies cough, dyspnea, DOE, pleurisy, hoarseness, laryngitis or wheezing.  Gastrointestinal: Denies dysphagia, heartburn, reflux, water brash, pain, cramps, nausea, vomiting, bloating, diarrhea, constipation, hematemesis, melena, hematochezia, jaundice or hemorrhoids Genitourinary: Denies dysuria, frequency, urgency, nocturia, hesitancy, discharge, hematuria or flank pain Musculoskeletal: Denies arthralgia, myalgia, stiffness, Jt. Swelling, pain, limp or strain/sprain. Skin: Denies puritis, rash, hives, warts, acne, eczema or change in skin lesion Neuro: No weakness, tremor, incoordination, spasms, paresthesia or pain Psychiatric: Denies confusion, memory loss or sensory loss Endocrine: Denies change in weight, skin, hair change, nocturia, and paresthesia, diabetic polys, visual blurring or hyper / hypo glycemic episodes.  Heme/Lymph: No excessive bleeding, bruising or enlarged lymph nodes.  Physical Exam  BP 124/70  Pulse 72  Temp98.1 F  Resp 16  Ht 5' 10.75"   Wt 143 lb 9.6 oz   BMI  20.17 kg/m2  General Appearance: Well nourished, in no apparent distress. Eyes: PERRLA, EOMs, conjunctiva no swelling or erythema, normal fundi and vessels. Sinuses: No frontal/maxillary tenderness ENT/Mouth: EACs patent / TMs  nl. Nares clear without erythema, swelling, mucoid exudates. Oral hygiene is good. No erythema, swelling, or exudate. Tongue normal, non-obstructing. Tonsils not swollen or erythematous. Hearing normal.  Neck: Supple, thyroid normal. No bruits, nodes or JVD. Respiratory: Respiratory effort normal.  BS equal and clear bilateral without rales, rhonci, wheezing or stridor. Cardio: Heart sounds are normal with regular  rate and rhythm and no murmurs, rubs or gallops. Peripheral pulses are normal and equal bilaterally without edema. No aortic or femoral bruits. Chest: Barrel configured with increased AP diameter & very distant BS and hyperresonant percussion. Abdomen: Flat, soft, with bowl sounds. Nontender, no guarding, rebound, hernias, masses, or organomegaly.  Lymphatics: Non tender without lymphadenopathy.  Genitourinary: No hernias.Testes nl. DRE - deferred for age. Musculoskeletal: Full ROM all peripheral extremities, joint stability, 5/5 strength, and normal gait. Skin: Warm and dry without rashes, lesions, cyanosis, clubbing or  ecchymosis.  Neuro: Cranial nerves intact, reflexes equal bilaterally. Normal muscle tone, no cerebellar symptoms. Sensation intact.  Pysch: Awake and oriented X 3with normal affect, insight and judgment appropriate.   Assessment and Plan  1. Annual Screening Examination 2. ASHD/ChCHF/AICD-Defib 3. Hypertension  3. Hyperlipidemia 5. Pre Diabetes 6. Vitamin D Deficiency 7. COPD  Continue prudent diet as discussed, weight control, BP monitoring, regular exercise, and medications as discussed.  Discussed med effects and SE's. Routine screening labs and tests as requested with regular follow-up as recommended.

## 2013-09-06 LAB — HEPATIC FUNCTION PANEL
ALBUMIN: 3.9 g/dL (ref 3.5–5.2)
ALK PHOS: 69 U/L (ref 39–117)
ALT: 12 U/L (ref 0–53)
AST: 18 U/L (ref 0–37)
Bilirubin, Direct: 0.1 mg/dL (ref 0.0–0.3)
Indirect Bilirubin: 0.6 mg/dL (ref 0.2–1.2)
Total Bilirubin: 0.7 mg/dL (ref 0.2–1.2)
Total Protein: 6.3 g/dL (ref 6.0–8.3)

## 2013-09-06 LAB — BASIC METABOLIC PANEL WITH GFR
BUN: 21 mg/dL (ref 6–23)
CALCIUM: 9.4 mg/dL (ref 8.4–10.5)
CHLORIDE: 99 meq/L (ref 96–112)
CO2: 30 mEq/L (ref 19–32)
CREATININE: 0.87 mg/dL (ref 0.50–1.35)
GFR, EST NON AFRICAN AMERICAN: 80 mL/min
Glucose, Bld: 94 mg/dL (ref 70–99)
Potassium: 4.3 mEq/L (ref 3.5–5.3)
Sodium: 138 mEq/L (ref 135–145)

## 2013-09-06 LAB — LIPID PANEL
Cholesterol: 156 mg/dL (ref 0–200)
HDL: 60 mg/dL (ref >39)
LDL Cholesterol: 77 mg/dL (ref 0–99)
TRIGLYCERIDES: 93 mg/dL (ref <150)
Total CHOL/HDL Ratio: 2.6 Ratio
VLDL: 19 mg/dL (ref 0–40)

## 2013-09-06 LAB — MICROALBUMIN / CREATININE URINE RATIO
CREATININE, URINE: 74.8 mg/dL
MICROALB UR: 0.52 mg/dL (ref 0.00–1.89)
Microalb Creat Ratio: 7 mg/g (ref 0.0–30.0)

## 2013-09-06 LAB — VITAMIN D 25 HYDROXY (VIT D DEFICIENCY, FRACTURES): VIT D 25 HYDROXY: 100 ng/mL — AB (ref 30–89)

## 2013-09-06 LAB — URINALYSIS, MICROSCOPIC ONLY
BACTERIA UA: NONE SEEN
Casts: NONE SEEN
Crystals: NONE SEEN
Squamous Epithelial / LPF: NONE SEEN

## 2013-09-06 LAB — PSA: PSA: <0.01 ng/mL (ref <=4.00)

## 2013-09-06 LAB — MAGNESIUM: MAGNESIUM: 2 mg/dL (ref 1.5–2.5)

## 2013-09-06 LAB — TSH: TSH: 1.22 u[IU]/mL (ref 0.350–4.500)

## 2013-09-06 LAB — INSULIN, FASTING: INSULIN FASTING, SERUM: 7 u[IU]/mL (ref 3–28)

## 2013-09-07 ENCOUNTER — Encounter: Payer: Self-pay | Admitting: Internal Medicine

## 2013-09-19 ENCOUNTER — Other Ambulatory Visit (INDEPENDENT_AMBULATORY_CARE_PROVIDER_SITE_OTHER): Payer: Medicare Other

## 2013-09-19 DIAGNOSIS — Z1212 Encounter for screening for malignant neoplasm of rectum: Secondary | ICD-10-CM

## 2013-09-19 LAB — POC HEMOCCULT BLD/STL (HOME/3-CARD/SCREEN)
Card #2 Fecal Occult Blod, POC: NEGATIVE
Card #3 Fecal Occult Blood, POC: NEGATIVE
FECAL OCCULT BLD: NEGATIVE

## 2013-10-19 ENCOUNTER — Other Ambulatory Visit: Payer: Self-pay | Admitting: Internal Medicine

## 2013-11-09 ENCOUNTER — Encounter: Payer: Self-pay | Admitting: Internal Medicine

## 2013-11-09 ENCOUNTER — Ambulatory Visit (INDEPENDENT_AMBULATORY_CARE_PROVIDER_SITE_OTHER): Payer: Medicare Other | Admitting: *Deleted

## 2013-11-09 DIAGNOSIS — I2589 Other forms of chronic ischemic heart disease: Secondary | ICD-10-CM

## 2013-11-09 DIAGNOSIS — I509 Heart failure, unspecified: Secondary | ICD-10-CM | POA: Diagnosis not present

## 2013-11-09 DIAGNOSIS — I255 Ischemic cardiomyopathy: Secondary | ICD-10-CM

## 2013-11-09 DIAGNOSIS — I5022 Chronic systolic (congestive) heart failure: Secondary | ICD-10-CM | POA: Diagnosis not present

## 2013-11-09 LAB — MDC_IDC_ENUM_SESS_TYPE_REMOTE
Battery Voltage: 3 V
Brady Statistic AP VP Percent: 0 %
Brady Statistic AP VS Percent: 58.86 %
Brady Statistic AS VP Percent: 0 %
Brady Statistic AS VS Percent: 41.14 %
Brady Statistic RA Percent Paced: 58.86 %
Brady Statistic RV Percent Paced: 0 %
Date Time Interrogation Session: 20150909151316
HighPow Impedance: 43 Ohm
HighPow Impedance: 55 Ohm
Lead Channel Impedance Value: 437 Ohm
Lead Channel Impedance Value: 494 Ohm
Lead Channel Sensing Intrinsic Amplitude: 12.875 mV
Lead Channel Sensing Intrinsic Amplitude: 12.875 mV
Lead Channel Sensing Intrinsic Amplitude: 2.125 mV
Lead Channel Sensing Intrinsic Amplitude: 2.125 mV
Lead Channel Setting Pacing Amplitude: 2 V
Lead Channel Setting Pacing Amplitude: 2.5 V
Lead Channel Setting Pacing Pulse Width: 0.4 ms
Lead Channel Setting Sensing Sensitivity: 0.3 mV
Zone Setting Detection Interval: 270 ms
Zone Setting Detection Interval: 320 ms
Zone Setting Detection Interval: 350 ms
Zone Setting Detection Interval: 400 ms

## 2013-11-09 NOTE — Progress Notes (Signed)
Remote ICD transmission.   

## 2013-12-01 ENCOUNTER — Other Ambulatory Visit: Payer: Self-pay | Admitting: *Deleted

## 2013-12-01 MED ORDER — FLUDROCORTISONE ACETATE 0.1 MG PO TABS: 0.1000 mg | ORAL_TABLET | Freq: Two times a day (BID) | ORAL | Status: DC

## 2013-12-05 ENCOUNTER — Encounter: Payer: Self-pay | Admitting: Cardiology

## 2013-12-07 ENCOUNTER — Encounter: Payer: Self-pay | Admitting: Physician Assistant

## 2013-12-07 ENCOUNTER — Ambulatory Visit (INDEPENDENT_AMBULATORY_CARE_PROVIDER_SITE_OTHER): Payer: Medicare Other | Admitting: Physician Assistant

## 2013-12-07 VITALS — BP 104/58 | HR 54 | Temp 98.2°F | Resp 16 | Ht 71.0 in | Wt 140.0 lb

## 2013-12-07 DIAGNOSIS — E785 Hyperlipidemia, unspecified: Secondary | ICD-10-CM

## 2013-12-07 DIAGNOSIS — I5022 Chronic systolic (congestive) heart failure: Secondary | ICD-10-CM | POA: Diagnosis not present

## 2013-12-07 DIAGNOSIS — Z79899 Other long term (current) drug therapy: Secondary | ICD-10-CM | POA: Diagnosis not present

## 2013-12-07 DIAGNOSIS — Z716 Tobacco abuse counseling: Secondary | ICD-10-CM | POA: Diagnosis not present

## 2013-12-07 DIAGNOSIS — R7309 Other abnormal glucose: Secondary | ICD-10-CM | POA: Diagnosis not present

## 2013-12-07 DIAGNOSIS — F17210 Nicotine dependence, cigarettes, uncomplicated: Secondary | ICD-10-CM

## 2013-12-07 DIAGNOSIS — I10 Essential (primary) hypertension: Secondary | ICD-10-CM | POA: Diagnosis not present

## 2013-12-07 DIAGNOSIS — Z95810 Presence of automatic (implantable) cardiac defibrillator: Secondary | ICD-10-CM

## 2013-12-07 DIAGNOSIS — Z23 Encounter for immunization: Secondary | ICD-10-CM | POA: Diagnosis not present

## 2013-12-07 DIAGNOSIS — I255 Ischemic cardiomyopathy: Secondary | ICD-10-CM

## 2013-12-07 DIAGNOSIS — R7303 Prediabetes: Secondary | ICD-10-CM

## 2013-12-07 DIAGNOSIS — E559 Vitamin D deficiency, unspecified: Secondary | ICD-10-CM

## 2013-12-07 LAB — MAGNESIUM: Magnesium: 2.1 mg/dL (ref 1.5–2.5)

## 2013-12-07 LAB — CBC WITH DIFFERENTIAL/PLATELET
BASOS PCT: 0 % (ref 0–1)
Basophils Absolute: 0 10*3/uL (ref 0.0–0.1)
EOS ABS: 0.1 10*3/uL (ref 0.0–0.7)
Eosinophils Relative: 1 % (ref 0–5)
HCT: 31.4 % — ABNORMAL LOW (ref 39.0–52.0)
HEMOGLOBIN: 11.1 g/dL — AB (ref 13.0–17.0)
Lymphocytes Relative: 17 % (ref 12–46)
Lymphs Abs: 1 10*3/uL (ref 0.7–4.0)
MCH: 32.2 pg (ref 26.0–34.0)
MCHC: 35.4 g/dL (ref 30.0–36.0)
MCV: 91 fL (ref 78.0–100.0)
MONOS PCT: 10 % (ref 3–12)
Monocytes Absolute: 0.6 10*3/uL (ref 0.1–1.0)
NEUTROS ABS: 4.4 10*3/uL (ref 1.7–7.7)
NEUTROS PCT: 72 % (ref 43–77)
PLATELETS: 295 10*3/uL (ref 150–400)
RBC: 3.45 MIL/uL — ABNORMAL LOW (ref 4.22–5.81)
RDW: 13 % (ref 11.5–15.5)
WBC: 6.1 10*3/uL (ref 4.0–10.5)

## 2013-12-07 LAB — LIPID PANEL
Cholesterol: 150 mg/dL (ref 0–200)
HDL: 65 mg/dL (ref >39)
LDL Cholesterol: 71 mg/dL (ref 0–99)
TRIGLYCERIDES: 72 mg/dL (ref <150)
Total CHOL/HDL Ratio: 2.3 Ratio
VLDL: 14 mg/dL (ref 0–40)

## 2013-12-07 LAB — BASIC METABOLIC PANEL WITH GFR
BUN: 25 mg/dL — ABNORMAL HIGH (ref 6–23)
CALCIUM: 9 mg/dL (ref 8.4–10.5)
CO2: 32 mEq/L (ref 19–32)
Chloride: 100 mEq/L (ref 96–112)
Creat: 1.05 mg/dL (ref 0.50–1.35)
GFR, EST AFRICAN AMERICAN: 75 mL/min
GFR, Est Non African American: 65 mL/min
GLUCOSE: 91 mg/dL (ref 70–99)
POTASSIUM: 4.7 meq/L (ref 3.5–5.3)
SODIUM: 138 meq/L (ref 135–145)

## 2013-12-07 LAB — HEPATIC FUNCTION PANEL
ALT: 13 U/L (ref 0–53)
AST: 20 U/L (ref 0–37)
Albumin: 3.9 g/dL (ref 3.5–5.2)
Alkaline Phosphatase: 93 U/L (ref 39–117)
BILIRUBIN DIRECT: 0.1 mg/dL (ref 0.0–0.3)
BILIRUBIN INDIRECT: 0.4 mg/dL (ref 0.2–1.2)
BILIRUBIN TOTAL: 0.5 mg/dL (ref 0.2–1.2)
Total Protein: 6.5 g/dL (ref 6.0–8.3)

## 2013-12-07 NOTE — Progress Notes (Signed)
Assessment and Plan:  Hypertension: Continue medication, monitor blood pressure at home. Continue DASH diet. Cholesterol: Continue diet and exercise. Check cholesterol.  Pre-diabetes-Continue diet and exercise. Check A1C Vitamin D Def- check level and continue medications.   Continue diet and meds as discussed. Further disposition pending results of labs.  HPI 80 y.o. male  presents for 3 month follow up with hypertension, hyperlipidemia, prediabetes and vitamin D. His blood pressure has been controlled at home, today their BP is BP: 104/58 mmHg He does not workout. He denies chest pain, shortness of breath, dizziness.  He is on cholesterol medication and denies myalgias. His cholesterol is at goal. The cholesterol last visit was:   Lab Results  Component Value Date   CHOL 156 09/05/2013   HDL 60 09/05/2013   LDLCALC 77 09/05/2013   TRIG 93 09/05/2013   CHOLHDL 2.6 09/05/2013   He has been working on diet and exercise for prediabetes, and denies paresthesia of the feet, polydipsia and polyuria. Last A1C in the office was:  Lab Results  Component Value Date   HGBA1C 5.9* 09/05/2013   Patient is on Vitamin D supplement.   Lab Results  Component Value Date   VD25OH 100* 09/05/2013     He has IDCMP with AICD, controlled on lasix, ACE, BB and has an AICD Weight controlled Wt Readings from Last 3 Encounters:  12/07/13 140 lb (63.504 kg)  09/05/13 143 lb 9.6 oz (65.137 kg)  06/01/13 146 lb (66.225 kg)   He has been having some dental problems and had to have a tooth pulled, he is on ABX for this.   Current Medications:  Current Outpatient Prescriptions on File Prior to Visit  Medication Sig Dispense Refill  . ALPRAZolam (XANAX) 1 MG tablet 1/2 to 1 tablet 3 x daily as needed for anxiety or sleep  270 tablet  1  . aspirin 81 MG tablet Take 81 mg by mouth 3 (three) times a week.        . Cholecalciferol (VITAMIN D3) 5000 UNITS CAPS Take 2 capsules by mouth daily.        . ENABLEX 7.5 MG 24 hr  tablet TAKE 1 TABLET EVERY DAY FORBLADDER  90 tablet  3  . fexofenadine-pseudoephedrine (ALLEGRA-D) 60-120 MG per tablet Take by mouth.      . fish oil-omega-3 fatty acids 1000 MG capsule Take 2 g by mouth daily.        . fludrocortisone (FLORINEF) 0.1 MG tablet Take 1 tablet (0.1 mg total) by mouth 2 (two) times daily.  180 tablet  1  . furosemide (LASIX) 40 MG tablet TAKE 1 TABLET TWICE A DAY  FOR BLOOD PRESSURE AND     FLUID  180 tablet  1  . glucosamine-chondroitin 500-400 MG tablet Take 1 tablet by mouth 2 (two) times daily.        Marland Kitchen HYDROcodone-acetaminophen (NORCO/VICODIN) 5-325 MG per tablet 1/2 to 1 tablet up to 3 x day for severe pain  100 tablet  0  . IRON PO Take 45 mg by mouth daily.       . Magnesium 250 MG TABS Take 400 tablets by mouth daily. 2 DAILY      . metoprolol succinate (TOPROL-XL) 25 MG 24 hr tablet Take 1 tablet (25 mg total) by mouth daily.  90 tablet  3  . Multiple Minerals-Vitamins (CITRACAL PLUS PO) Take 500 capsules by mouth 2 (two) times daily.        Marland Kitchen NEXIUM 40 MG  capsule TAKE 1 CAPSULE DAILY FOR   ACID REFLUX  90 capsule  3  . NON FORMULARY Halo beta a sol cream .05% 2-4- x per week as needed       . polyethylene glycol (MIRALAX / GLYCOLAX) packet Take 17 g by mouth as needed.        . quinapril (ACCUPRIL) 20 MG tablet TAKE 1 TABLET DAILY FOR    BLOOD PRESSURE AND HEART  90 tablet  3  . Red Yeast Rice Extract (RED YEAST RICE PO) Take by mouth daily. 2 DAILY       . triamcinolone cream (KENALOG) 0.1 % Apply 1 application topically 2 (two) times daily.      . vitamin C (ASCORBIC ACID) 500 MG tablet Take 1,000 mg by mouth daily.        . [DISCONTINUED] darifenacin (ENABLEX) 7.5 MG 24 hr tablet Take 7.5 mg by mouth daily.        No current facility-administered medications on file prior to visit.   Medical History:  Past Medical History  Diagnosis Date  . Ischemic cardiomyopathy     EF 32%  . OA (osteoarthritis) of knee   . Ganglion cyst of wrist     LEFT  WRIST  . MI, old     INFERIOR LATERAL  . Hyperlipidemia   . GERD (gastroesophageal reflux disease)   . ICD (implantable cardiac defibrillator) in place August 2010  . Hx of CABG 1992  . S/P cardiac cath 2006    Occluded SVG to OM. Managed medically  . Normal nuclear stress test June 2010    No ischemia. EF 32%  . Vitamin D deficiency   . Pre-diabetes    Allergies:  Allergies  Allergen Reactions  . Lipitor [Atorvastatin]     Elevated cpk   . Mobic [Meloxicam] Other (See Comments)    edema  . Nsaids Other (See Comments)    dyspepsia     Review of Systems: [X]  = complains of  [ ]  = denies  General: Fatigue [ ]  Fever [ ]  Chills [ ]  Weakness [ ]   Insomnia [ ]  Eyes: Redness [ ]  Blurred vision [ ]  Diplopia [ ]   ENT: Congestion [ ]  Sinus Pain [ ]  Post Nasal Drip [ ]  Sore Throat [ ]  Earache [ ]   Cardiac: Chest pain/pressure [ ]  SOB [ ]  Orthopnea [ ]   Palpitations [ ]   Paroxysmal nocturnal dyspnea[ ]  Claudication [ ]  Edema [ ]   Pulmonary: Cough [ ]  Wheezing[ ]   SOB [ ]   Snoring [ ]   GI: Nausea [ ]  Vomiting[ ]  Dysphagia[ ]  Heartburn[ ]  Abdominal pain [ ]  Constipation [ ] ; Diarrhea [ ] ; BRBPR [ ]  Melena[ ]  GU: Hematuria[ ]  Dysuria [ ]  Nocturia[ ]  Urgency [ ]   Hesitancy [ ]  Discharge [ ]  Neuro: Headaches[ ]  Vertigo[ ]  Paresthesias[ ]  Spasm [ ]  Speech changes [ ]  Incoordination [ ]   Ortho: Arthritis [ ]  Joint pain [ ]  Muscle pain [ ]  Joint swelling [ ]  Back Pain [ ]  Skin:  Rash [ ]   Pruritis [ ]  Change in skin lesion [ ]   Psych: Depression[ ]  Anxiety[ ]  Confusion [ ]  Memory loss [ ]   Heme/Lypmh: Bleeding [ ]  Bruising [ ]  Enlarged lymph nodes [ ]   Endocrine: Visual blurring [ ]  Paresthesia [ ]  Polyuria [ ]  Polydypsea [ ]    Heat/cold intolerance [ ]  Hypoglycemia [ ]   Family history- Review and unchanged Social history- Review and unchanged Physical Exam:  BP 104/58  Pulse 54  Temp(Src) 98.2 F (36.8 C) (Temporal)  Resp 16  Ht 5\' 11"  (1.803 m)  Wt 140 lb (63.504 kg)  BMI 19.53  kg/m2 Wt Readings from Last 3 Encounters:  12/07/13 140 lb (63.504 kg)  09/05/13 143 lb 9.6 oz (65.137 kg)  06/01/13 146 lb (66.225 kg)   General Appearance: Well nourished, in no apparent distress. Eyes: PERRLA, EOMs, conjunctiva no swelling or erythema Sinuses: No Frontal/maxillary tenderness ENT/Mouth: Ext aud canals clear, TMs without erythema, bulging. No erythema, swelling, or exudate on post pharynx.  Tonsils not swollen or erythematous. Hearing normal.  Neck: Supple, thyroid normal.  Respiratory: Respiratory effort normal, BS equal bilaterally without rales, rhonchi, wheezing or stridor.  Cardio: RRR with no MRGs. Brisk peripheral pulses without edema.  Abdomen: Soft, + BS.  Non tender, no guarding, rebound, hernias, masses. Lymphatics: Non tender without lymphadenopathy.  Musculoskeletal: Full ROM, 5/5 strength, normal gait.  Skin: Warm, dry without rashes, lesions, ecchymosis.  Neuro: Cranial nerves intact. Normal muscle tone, no cerebellar symptoms. Sensation intact.  Psych: Awake and oriented X 3, normal affect, Insight and Judgment appropriate.    Vicie Mutters 3:45 PM

## 2013-12-08 LAB — TSH: TSH: 1.469 u[IU]/mL (ref 0.350–4.500)

## 2013-12-08 LAB — HEMOGLOBIN A1C
Hgb A1c MFr Bld: 5.8 % — ABNORMAL HIGH (ref <5.7)
Mean Plasma Glucose: 120 mg/dL — ABNORMAL HIGH (ref <117)

## 2013-12-13 DIAGNOSIS — J320 Chronic maxillary sinusitis: Secondary | ICD-10-CM | POA: Diagnosis not present

## 2013-12-20 ENCOUNTER — Ambulatory Visit (INDEPENDENT_AMBULATORY_CARE_PROVIDER_SITE_OTHER): Payer: Medicare Other | Admitting: Internal Medicine

## 2013-12-20 ENCOUNTER — Encounter: Payer: Self-pay | Admitting: Internal Medicine

## 2013-12-20 VITALS — BP 98/46 | HR 72 | Temp 98.1°F | Resp 16 | Ht 71.0 in | Wt 143.0 lb

## 2013-12-20 DIAGNOSIS — Z79899 Other long term (current) drug therapy: Secondary | ICD-10-CM

## 2013-12-20 DIAGNOSIS — L03119 Cellulitis of unspecified part of limb: Secondary | ICD-10-CM | POA: Diagnosis not present

## 2013-12-20 DIAGNOSIS — J0100 Acute maxillary sinusitis, unspecified: Secondary | ICD-10-CM | POA: Diagnosis not present

## 2013-12-20 LAB — CBC WITH DIFFERENTIAL/PLATELET
BASOS ABS: 0 10*3/uL (ref 0.0–0.1)
BASOS PCT: 0 % (ref 0–1)
Eosinophils Absolute: 0 10*3/uL (ref 0.0–0.7)
Eosinophils Relative: 0 % (ref 0–5)
HEMATOCRIT: 29.8 % — AB (ref 39.0–52.0)
Hemoglobin: 10.1 g/dL — ABNORMAL LOW (ref 13.0–17.0)
Lymphocytes Relative: 12 % (ref 12–46)
Lymphs Abs: 0.8 10*3/uL (ref 0.7–4.0)
MCH: 31.6 pg (ref 26.0–34.0)
MCHC: 33.9 g/dL (ref 30.0–36.0)
MCV: 93.1 fL (ref 78.0–100.0)
Monocytes Absolute: 0.6 10*3/uL (ref 0.1–1.0)
Monocytes Relative: 8 % (ref 3–12)
NEUTROS ABS: 5.5 10*3/uL (ref 1.7–7.7)
Neutrophils Relative %: 80 % — ABNORMAL HIGH (ref 43–77)
Platelets: 312 10*3/uL (ref 150–400)
RBC: 3.2 MIL/uL — ABNORMAL LOW (ref 4.22–5.81)
RDW: 12.5 % (ref 11.5–15.5)
WBC: 6.9 10*3/uL (ref 4.0–10.5)

## 2013-12-20 MED ORDER — DOXYCYCLINE HYCLATE 100 MG PO CAPS: ORAL_CAPSULE | ORAL | Status: DC

## 2013-12-20 NOTE — Progress Notes (Signed)
Subjective:    Patient ID: Richard Moody, male    DOB: Jul 27, 1927, 80 y.o.   MRN: [id number]  HPI Patient is a very nice 80 yo MWM presenting with report of recent  broken Rt  2sd or 3rd crown requiring surgical extraction.  Subsequently he has noticed a mucopurulent  discharge from his right nostril. He had been treated by his dentist and Oral Surgeon with 1st Amoxil and now currently with Keflex. Wife is concerned that he has been less active and c/o more of his chronic arthralgias. Also notes he had swelling over his dorsal right hand yesterday which is now resolved, but has tender swelling of his dorsal left wrist today.   Medication List   ALPRAZolam 1 MG tablet  Commonly known as:  XANAX  1/2 to 1 tablet 3 x daily as needed for anxiety or sleep     aspirin 81 MG tablet  Take 81 mg by mouth 3 (three) times a week.     CITRACAL PLUS PO  Take 500 capsules by mouth 2 (two) times daily.     doxycycline 100 MG capsule  Commonly known as:  VIBRAMYCIN  Take 2 capsules at once on a full stomach, then take 1 capsule  2 x day on a full stomach for infection for 2 weeks     ENABLEX 7.5 MG 24 hr tablet  Generic drug:  darifenacin  TAKE 1 TABLET EVERY DAY FORBLADDER     fexofenadine-pseudoephedrine 60-120 MG per tablet  Commonly known as:  ALLEGRA-D  Take by mouth.     fish oil-omega-3 fatty acids 1000 MG capsule  Take 2 g by mouth daily.     fludrocortisone 0.1 MG tablet  Commonly known as:  FLORINEF  Take 1 tablet (0.1 mg total) by mouth 2 (two) times daily.     furosemide 40 MG tablet  Commonly known as:  LASIX  TAKE 1 TABLET TWICE A DAY  FOR BLOOD PRESSURE AND     FLUID     glucosamine-chondroitin 500-400 MG tablet  Take 1 tablet by mouth 2 (two) times daily.     HYDROcodone-acetaminophen 5-325 MG per tablet  Commonly known as:  NORCO/VICODIN  1/2 to 1 tablet up to 3 x day for severe pain     IRON PO  Take 45 mg by mouth daily.     Magnesium 250 MG Tabs  Take 400  tablets by mouth daily. 2 DAILY     metoprolol succinate 25 MG 24 hr tablet  Commonly known as:  TOPROL-XL  Take 1 tablet (25 mg total) by mouth daily.     NEXIUM 40 MG capsule  Generic drug:  esomeprazole  TAKE 1 CAPSULE DAILY FOR   ACID REFLUX     NON FORMULARY  Halo beta a sol cream .05% 2-4- x per week as needed     polyethylene glycol packet  Commonly known as:  MIRALAX / GLYCOLAX  Take 17 g by mouth as needed.     quinapril 20 MG tablet  Commonly known as:  ACCUPRIL  TAKE 1 TABLET DAILY FOR    BLOOD PRESSURE AND HEART     RED YEAST RICE PO  Take by mouth daily. 2 DAILY     triamcinolone cream 0.1 %  Commonly known as:  KENALOG  Apply 1 application topically 2 (two) times daily.     vitamin C 500 MG tablet  Commonly known as:  ASCORBIC ACID  Take 1,000 mg by mouth  daily.     Vitamin D3 5000 UNITS Caps  Take 2 capsules by mouth daily.     Allergies  Allergen Reactions  . Lipitor [Atorvastatin]     Elevated cpk   . Mobic [Meloxicam] Other (See Comments)    edema  . Nsaids Other (See Comments)    dyspepsia   Past Medical History  Diagnosis Date  . Ischemic cardiomyopathy     EF 32%  . OA (osteoarthritis) of knee   . Ganglion cyst of wrist     LEFT WRIST  . MI, old     INFERIOR LATERAL  . Hyperlipidemia   . GERD (gastroesophageal reflux disease)   . ICD (implantable cardiac defibrillator) in place August 2010  . Hx of CABG 1992  . S/P cardiac cath 2006    Occluded SVG to OM. Managed medically  . Normal nuclear stress test June 2010    No ischemia. EF 32%  . Vitamin D deficiency   . Pre-diabetes    Review of Systems non contributory to above   Objective:   Physical Exam  BP 98/46  Pulse 72  Temp(Src) 98.1 F (36.7 C) (Temporal)  Resp 16  Ht 5\' 11"  (1.803 m)  Wt 143 lb (64.864 kg)  BMI 19.95 kg/m2  HEENT - Eac's patent. TM's Nl. EOM's full. PERRLA. NasoOroPharynx clear w/o fistulous tract noted. Socket is noted of right upper extraction.  Slight right maxillary tenderness. No facial assymetrey.  Neck - supple. Nl Thyroid. Carotids 2+ & No bruits, nodes, JVD Chest - Clear equal BS w/o Rales, rhonchi, wheezes. Cor - Nl HS. RRR w/o sig MGR. PP 1(+). No edema. Abd - No palpable organomegaly, masses or tenderness. BS nl. MS- FROM w/o deformities. Muscle power, tone and bulk Nl. Gait Nl. Sl tender warm, erythematous  and tender STS over the dorsal right wrist /hand. Normal sensory motor and ROM.  Neuro - No obvious Cr N abnormalities. Sensory, motor and Cerebellar functions appear Nl w/o focal abnormalities. Psyche - Mental status normal & appropriate.  No delusions, ideations or obvious mood abnormalities.  Assessment & Plan:   1. Acute maxillary sinusitis, recurrence not specified  2. Cellulitis of hand/wrist - left  3. Medication management - CBC with Differential  - Rx Doxycycline 100 mg x 2 daily (14 days) - ROV 10 days

## 2013-12-23 ENCOUNTER — Other Ambulatory Visit: Payer: Self-pay | Admitting: *Deleted

## 2013-12-23 MED ORDER — ALPRAZOLAM 1 MG PO TABS: ORAL_TABLET | ORAL | Status: DC

## 2013-12-27 DIAGNOSIS — J320 Chronic maxillary sinusitis: Secondary | ICD-10-CM | POA: Diagnosis not present

## 2014-01-02 ENCOUNTER — Encounter: Payer: Self-pay | Admitting: Internal Medicine

## 2014-01-02 ENCOUNTER — Ambulatory Visit (INDEPENDENT_AMBULATORY_CARE_PROVIDER_SITE_OTHER): Payer: Medicare Other | Admitting: Internal Medicine

## 2014-01-02 VITALS — BP 110/60 | HR 64 | Temp 97.9°F | Resp 16 | Ht 71.0 in | Wt 138.8 lb

## 2014-01-02 DIAGNOSIS — M150 Primary generalized (osteo)arthritis: Secondary | ICD-10-CM | POA: Diagnosis not present

## 2014-01-02 DIAGNOSIS — L03119 Cellulitis of unspecified part of limb: Secondary | ICD-10-CM

## 2014-01-02 DIAGNOSIS — M159 Polyosteoarthritis, unspecified: Secondary | ICD-10-CM

## 2014-01-02 DIAGNOSIS — M1000 Idiopathic gout, unspecified site: Secondary | ICD-10-CM | POA: Diagnosis not present

## 2014-01-02 LAB — CBC WITH DIFFERENTIAL/PLATELET
BASOS ABS: 0 10*3/uL (ref 0.0–0.1)
BASOS PCT: 0 % (ref 0–1)
EOS ABS: 0.1 10*3/uL (ref 0.0–0.7)
Eosinophils Relative: 1 % (ref 0–5)
HCT: 31.5 % — ABNORMAL LOW (ref 39.0–52.0)
Hemoglobin: 10.9 g/dL — ABNORMAL LOW (ref 13.0–17.0)
Lymphocytes Relative: 17 % (ref 12–46)
Lymphs Abs: 1 10*3/uL (ref 0.7–4.0)
MCH: 31.9 pg (ref 26.0–34.0)
MCHC: 34.6 g/dL (ref 30.0–36.0)
MCV: 92.1 fL (ref 78.0–100.0)
Monocytes Absolute: 0.6 10*3/uL (ref 0.1–1.0)
Monocytes Relative: 11 % (ref 3–12)
NEUTROS PCT: 71 % (ref 43–77)
Neutro Abs: 4 10*3/uL (ref 1.7–7.7)
PLATELETS: 321 10*3/uL (ref 150–400)
RBC: 3.42 MIL/uL — ABNORMAL LOW (ref 4.22–5.81)
RDW: 12.9 % (ref 11.5–15.5)
WBC: 5.7 10*3/uL (ref 4.0–10.5)

## 2014-01-02 LAB — URIC ACID: URIC ACID, SERUM: 5 mg/dL (ref 4.0–7.8)

## 2014-01-02 MED ORDER — CEPHALEXIN 500 MG PO CAPS: 500.0000 mg | ORAL_CAPSULE | Freq: Four times a day (QID) | ORAL | Status: AC

## 2014-01-02 MED ORDER — HYDROCODONE-ACETAMINOPHEN 5-325 MG PO TABS: ORAL_TABLET | ORAL | Status: DC

## 2014-01-02 NOTE — Patient Instructions (Signed)

## 2014-01-02 NOTE — Progress Notes (Signed)
Subjective:    Patient ID: Richard Moody, male    DOB: 03/22/1927, 80 y.o.   MRN: [id number]  HPI Patient presents for f/u treatment of suspected cellulitis of the dorsal left hand & wrist 12 days ago with initial improvement and the relapse over the last several days. Patient denies trauma or hx/o gout. He continues to attribute these sx's to a dental extraction Sept 2sd.   He does c/o longstanding limiting pains of multiple joints, especially his shoulders, hips and knees. Reports the pains not only limit his activities , but also sometimes awaken him from sleep.  Medication Sig  . ALPRAZolam (XANAX) 1 MG tablet 1/2 to 1 tablet 3 x daily as needed for anxiety or sleep  . aspirin 81 MG tablet Take 81 mg by mouth 3 (three) times a week.    . Cholecalciferol (VITAMIN D3) 5000 UNITS CAPS Take 2 capsules by mouth daily.    Marland Kitchen doxycycline (VIBRAMYCIN) 100 MG capsule Take 2 capsules at once on a full stomach, then take 1 capsule  2 x day on a full stomach for infection for 2 weeks  . ENABLEX 7.5 MG 24 hr tablet TAKE 1 TABLET EVERY DAY FORBLADDER  . fexofenadine-pseudoephedrine (ALLEGRA-D) 60-120 MG per tablet Take by mouth.  . fish oil-omega-3 fatty acids 1000 MG capsule Take 2 g by mouth daily.    . fludrocortisone (FLORINEF) 0.1 MG tablet Take 1 tablet (0.1 mg total) by mouth 2 (two) times daily.  . furosemide (LASIX) 40 MG tablet TAKE 1 TABLET TWICE A DAY  FOR BLOOD PRESSURE AND     FLUID  . glucosamine-chondroitin 500-400 MG tablet Take 1 tablet by mouth 2 (two) times daily.    . IRON PO Take 45 mg by mouth daily.   . Magnesium 250 MG TABS Take 400 tablets by mouth daily. 2 DAILY  . metoprolol succinate (TOPROL-XL) 25 MG 24 hr tablet Take 1 tablet (25 mg total) by mouth daily.  . Multiple Minerals-Vitamins (CITRACAL PLUS PO) Take 500 capsules by mouth 2 (two) times daily.    Marland Kitchen NEXIUM 40 MG capsule TAKE 1 CAPSULE DAILY FOR   ACID REFLUX  . NON FORMULARY Halo beta a sol cream .05% 2-4- x per  week as needed   . polyethylene glycol (MIRALAX / GLYCOLAX) packet Take 17 g by mouth as needed.    . quinapril (ACCUPRIL) 20 MG tablet TAKE 1 TABLET DAILY FOR    BLOOD PRESSURE AND HEART  . Red Yeast Rice Extract (RED YEAST RICE PO) Take by mouth daily. 2 DAILY   . triamcinolone cream (KENALOG) 0.1 % Apply 1 application topically 2 (two) times daily.  . vitamin C (ASCORBIC ACID) 500 MG tablet Take 1,000 mg by mouth daily.    Marland Kitchen HYDROcodone-acetaminophen (NORCO/VICODIN) 5-325 MG per tablet 1/2 to 1 tablet up to 3 x day for severe pain   Allergies  Allergen Reactions  . Lipitor [Atorvastatin]     Elevated cpk   . Mobic [Meloxicam] Other (See Comments)    edema  . Nsaids Other (See Comments)    dyspepsia   Past Medical History  Diagnosis Date  . Ischemic cardiomyopathy     EF 32%  . OA (osteoarthritis) of knee   . Ganglion cyst of wrist     LEFT WRIST  . MI, old     INFERIOR LATERAL  . Hyperlipidemia   . GERD (gastroesophageal reflux disease)   . ICD (implantable cardiac defibrillator) in place August  2010  . Hx of CABG 1992  . S/P cardiac cath 2006    Occluded SVG to OM. Managed medically  . Normal nuclear stress test June 2010    No ischemia. EF 32%  . Vitamin D deficiency   . Pre-diabetes    Past Surgical History  Procedure Laterality Date  . Cardiac catheterization  10/14/2004    THERE IS POSTERIOR LATERAL HYPOKINESIA. EF 30-35%  . Coronary artery bypass graft  1992  . Knee surgery      RIGHT KNEE  . Cardiac defibrillator placement  10/2008  . Inguinal hernia repair    . Cardiac catheterization  2006    OCCLUDED SVG TO OM  . Nuclear stress test  2010    EF 32%. No ischemia   Review of Systems noncontributory to above  Objective:   Physical Exam BP 110/60 mmHg  Pulse 64  Temp(Src) 97.9 F (36.6 C)  Resp 16  Ht 5\' 11"  (1.803 m)  Wt 138 lb 12.8 oz (62.959 kg)  BMI 19.37 kg/m2  Exam finds ? Sl warm, but tender STS with erythema over the dorsal left wrist.  ROM is WNL. No signs of lymphangitis.  Assessment & Plan:   1. Cellulitis of hand  - CBC with Differential  2. Acute idiopathic gout, unspecified site  - Uric acid  3. Primary osteoarthritis involving multiple joints  - refill his Norco with instructions to use very sparingly. - ROV 1 week

## 2014-01-13 ENCOUNTER — Ambulatory Visit (INDEPENDENT_AMBULATORY_CARE_PROVIDER_SITE_OTHER): Payer: Medicare Other | Admitting: Internal Medicine

## 2014-01-13 ENCOUNTER — Encounter: Payer: Self-pay | Admitting: Internal Medicine

## 2014-01-13 VITALS — BP 114/62 | HR 60 | Temp 98.1°F | Resp 16 | Ht 70.75 in | Wt 141.4 lb

## 2014-01-13 DIAGNOSIS — L03119 Cellulitis of unspecified part of limb: Secondary | ICD-10-CM

## 2014-01-13 DIAGNOSIS — I255 Ischemic cardiomyopathy: Secondary | ICD-10-CM | POA: Diagnosis not present

## 2014-01-15 NOTE — Progress Notes (Signed)
Subjective:    Patient ID: Richard Moody, male    DOB: 1927/08/22, 80 y.o.   MRN: [id number]  HPI Patient returns dental abscess for f/u of cellulitis of left hand not resolving with doxycycline and last treated with Keflex with apparent resolution of his skin infection. He also was recently started on another antibiotic by his dentist who has been treating him for a dental abscess.   Medication Sig  . ALPRAZolam (XANAX) 1 MG tablet 1/2 to 1 tablet 3 x daily as needed for anxiety or sleep  . aspirin 81 MG tablet Take 81 mg 3  times a week.    Marland Kitchen VITAMIN D3 5000 UNITS  Take 2 capsules  daily.    Marland Kitchen doxycycline 100 MG capsule - complreted   . ENABLEX 7.5 MG 24 hr tablet TAKE 1 TAB EVERY DAY   . fexofenadine-pseudoephedrine (ALLEGRA-D) 60-120 MG per tablet Take Daily  . fish oil 1000 MG cap Take 2 g by mouth daily.    . fludrocortisone (FLORINEF) 0.1 MG tablet Take 1 tablet (0.1 mg total) by mouth 2 (two) times daily.  . furosemide (LASIX) 40 MG tablet TAKE 1 TAB TWICE A DAY    . glucosamine-chondroitin 500-400 MG tablet Take 1 tab 2  times daily.    . NORCO 5-325 MG 1/2 to 1 tablet 2 x day if needed for pain  . IRON PO Take 45 mg by mouth daily.   . Magnesium 250 MG TABS Take 400 tablets by mouth daily. 2 DAILY  . metoprolol succinate XL 25 MG  Take 1 tablet  daily.  . Multiple Minerals-Vitamins Take 500 capsules  2  times daily.    Marland Kitchen NEXIUM 40 MG capsule TAKE 1 CAP DAILY   . NON FORMULARY Halo beta a sol cream .05% 2-4- x per week as needed   . MIRALAX / Take 17 g by mouth as needed.    . Quinapril 20 MG tablet TAKE 1 TAB DAILY  . Red Yeast Rice Extract  Take by mouth daily. 2 DAILY   . triamcinolone cream (KENALOG) 0.1 % Apply 1 application topically 2 (two) times daily.  . vitamin C  500 MG tablet Take 1,000 mg by mouth daily.     Allergies  Allergen Reactions  . Lipitor [Atorvastatin]     Elevated cpk   . Mobic [Meloxicam] Other (See Comments)    edema  . Nsaids Other (See  Comments)    dyspepsia   Past Medical History  Diagnosis Date  . Ischemic cardiomyopathy     EF 32%  . OA (osteoarthritis) of knee   . Ganglion cyst of wrist     LEFT WRIST  . MI, old     INFERIOR LATERAL  . Hyperlipidemia   . GERD (gastroesophageal reflux disease)   . ICD (implantable cardiac defibrillator) in place August 2010  . Hx of CABG 1992  . S/P cardiac cath 2006    Occluded SVG to OM. Managed medically  . Normal nuclear stress test June 2010    No ischemia. EF 32%  . Vitamin D deficiency   . Pre-diabetes    Past Surgical History  Procedure Laterality Date  . Cardiac catheterization  10/14/2004    THERE IS POSTERIOR LATERAL HYPOKINESIA. EF 30-35%  . Coronary artery bypass graft  1992  . Knee surgery      RIGHT KNEE  . Cardiac defibrillator placement  10/2008  . Inguinal hernia repair    . Cardiac  catheterization  2006    OCCLUDED SVG TO OM  . Nuclear stress test  2010    EF 32%. No ischemia   Review of Systems  10 point system review negative except as above  Objective:   Physical Exam  BP 114/62 mmHg  Pulse 60  Temp(Src) 98.1 F (36.7 C)  Resp 16  Ht 5' 10.75" (1.797 m)  Wt 141 lb 6.4 oz (64.139 kg)  BMI 19.86 kg/m2  HEENT - Eac's patent. TM's Nl. EOM's full. PERRLA. NasoOroPharynx clear. Neck - supple. Nl Thyroid. Carotids 2+ & No bruits, nodes, JVD Chest - Clear equal BS w/o Rales, rhonchi, wheezes. Cor - Nl HS. RRR w/o sig MGR. PP 1(+). No edema. Abd - No palpable organomegaly, masses or tenderness. BS nl. MS- FROM w/o deformities. Muscle power, tone and bulk Nl. Gait Nl.Left hand now appears normal w/o erythema or STS  Neuro - No obvious Cr N abnormalities. Sensory, motor and Cerebellar functions appear Nl w/o focal abnormalities. Psyche - Mental status normal & appropriate.  No delusions, ideations or obvious mood abnormalities.    Assessment & Plan:   1. Cellulitis of hand - resolved

## 2014-01-28 ENCOUNTER — Other Ambulatory Visit: Payer: Self-pay | Admitting: Physician Assistant

## 2014-03-08 ENCOUNTER — Ambulatory Visit (INDEPENDENT_AMBULATORY_CARE_PROVIDER_SITE_OTHER): Payer: Medicare Other | Admitting: Internal Medicine

## 2014-03-08 ENCOUNTER — Encounter: Payer: Self-pay | Admitting: Internal Medicine

## 2014-03-08 VITALS — BP 112/62 | HR 67 | Ht 71.0 in | Wt 136.6 lb

## 2014-03-08 DIAGNOSIS — I5022 Chronic systolic (congestive) heart failure: Secondary | ICD-10-CM | POA: Diagnosis not present

## 2014-03-08 DIAGNOSIS — I255 Ischemic cardiomyopathy: Secondary | ICD-10-CM | POA: Diagnosis not present

## 2014-03-08 LAB — MDC_IDC_ENUM_SESS_TYPE_INCLINIC
Battery Voltage: 3 V
Brady Statistic AP VP Percent: 0 %
Brady Statistic AS VP Percent: 0 %
Brady Statistic AS VS Percent: 46.33 %
Brady Statistic RA Percent Paced: 53.66 %
Brady Statistic RV Percent Paced: 0.01 %
Date Time Interrogation Session: 20160106145007
HIGH POWER IMPEDANCE MEASURED VALUE: 59 Ohm
HighPow Impedance: 46 Ohm
Lead Channel Impedance Value: 437 Ohm
Lead Channel Impedance Value: 494 Ohm
Lead Channel Pacing Threshold Amplitude: 0.75 V
Lead Channel Pacing Threshold Amplitude: 1 V
Lead Channel Pacing Threshold Pulse Width: 0.4 ms
Lead Channel Pacing Threshold Pulse Width: 0.4 ms
Lead Channel Sensing Intrinsic Amplitude: 14.25 mV
Lead Channel Sensing Intrinsic Amplitude: 3.625 mV
Lead Channel Setting Pacing Amplitude: 2.5 V
Lead Channel Setting Pacing Pulse Width: 0.4 ms
MDC IDC SET LEADCHNL RA PACING AMPLITUDE: 2 V
MDC IDC SET LEADCHNL RV SENSING SENSITIVITY: 0.3 mV
MDC IDC SET ZONE DETECTION INTERVAL: 320 ms
MDC IDC SET ZONE DETECTION INTERVAL: 350 ms
MDC IDC SET ZONE DETECTION INTERVAL: 400 ms
MDC IDC STAT BRADY AP VS PERCENT: 53.66 %
Zone Setting Detection Interval: 270 ms

## 2014-03-08 NOTE — Patient Instructions (Signed)
Your physician recommends that you continue on your current medications as directed. Please refer to the Current Medication list given to you today.  Remote monitoring is used to monitor your Pacemaker of ICD from home. This monitoring reduces the number of office visits required to check your device to one time per year. It allows [place] to keep an eye on the functioning of your device to ensure it is working properly. You are scheduled for a device check from home on 06/07/14. You may send your transmission at any time that day. If you have a wireless device, the transmission will be sent automatically. After your physician reviews your transmission, you will receive a postcard with your next transmission date.  Your physician wants you to follow-up in: 1 year with Dr. Caryl Comes. . You will receive a reminder letter in the mail two months in advance. If you don't receive a letter, please call our office to schedule the follow-up appointment.

## 2014-03-08 NOTE — Progress Notes (Signed)
Patient Care Team: Unk Pinto, MD as PCP - General (Internal Medicine) Unk Pinto, MD (Internal Medicine) Darleen Crocker, MD as Consulting Physician (Ophthalmology) Deboraha Sprang, MD as Consulting Physician (Cardiology)   HPI  Richard Moody is a 81 y.o. male  seen in followup for ICD implanted in 2010.  He has a history of ischemic heart disease with prior bypass surgery depressed left ventricular function with EFof 32%. He has  chronic systolic heart failure but has done relatively well.   The patient denies chest pain, shortness of breath, nocturnal dyspnea, orthopnea or peripheral edema.  There have been no palpitations, lightheadedness or syncope.      Past Medical History  Diagnosis Date  . Ischemic cardiomyopathy     EF 32%  . OA (osteoarthritis) of knee   . Ganglion cyst of wrist     LEFT WRIST  . MI, old     INFERIOR LATERAL  . Hyperlipidemia   . GERD (gastroesophageal reflux disease)   . ICD (implantable cardiac defibrillator) in place August 2010  . Hx of CABG 1992  . S/P cardiac cath 2006    Occluded SVG to OM. Managed medically  . Normal nuclear stress test June 2010    No ischemia. EF 32%  . Vitamin D deficiency   . Pre-diabetes     Past Surgical History  Procedure Laterality Date  . Cardiac catheterization  10/14/2004    THERE IS POSTERIOR LATERAL HYPOKINESIA. EF 30-35%  . Coronary artery bypass graft  1992  . Knee surgery      RIGHT KNEE  . Cardiac defibrillator placement  10/2008  . Inguinal hernia repair    . Cardiac catheterization  2006    OCCLUDED SVG TO OM  . Nuclear stress test  2010    EF 32%. No ischemia    Current Outpatient Prescriptions  Medication Sig Dispense Refill  . ALPRAZolam (XANAX) 1 MG tablet 1/2 to 1 tablet 3 x daily as needed for anxiety or sleep 270 tablet 1  . aspirin 81 MG tablet Take 81 mg by mouth 3 (three) times a week.      . Cholecalciferol (VITAMIN D3) 5000 UNITS CAPS Take 2 capsules by mouth  daily.      . ENABLEX 7.5 MG 24 hr tablet TAKE 1 TABLET EVERY DAY FORBLADDER 90 tablet 3  . fexofenadine-pseudoephedrine (ALLEGRA-D) 60-120 MG per tablet Take 1 tablet by mouth daily.     . fish oil-omega-3 fatty acids 1000 MG capsule Take 2 g by mouth daily.      . fludrocortisone (FLORINEF) 0.1 MG tablet Take 1 tablet (0.1 mg total) by mouth 2 (two) times daily. 180 tablet 1  . furosemide (LASIX) 40 MG tablet TAKE 1 TABLET TWICE A DAY  FOR BLOOD PRESSURE AND     FLUID 180 tablet 1  . glucosamine-chondroitin 500-400 MG tablet Take 1 tablet by mouth 2 (two) times daily.      Marland Kitchen HYDROcodone-acetaminophen (NORCO) 5-325 MG per tablet 1/2 to 1 tablet 2 x day if needed for pain 50 tablet 0  . IRON PO Take 45 mg by mouth daily.     . magnesium oxide (MAG-OX) 400 MG tablet Take 800 mg by mouth daily.    . metoprolol succinate (TOPROL-XL) 25 MG 24 hr tablet Take 1 tablet (25 mg total) by mouth daily. 90 tablet 3  . Multiple Minerals-Vitamins (CITRACAL PLUS PO) Take 1 capsule by mouth daily.     Marland Kitchen  NEXIUM 40 MG capsule TAKE 1 CAPSULE DAILY FOR   ACID REFLUX 90 capsule 3  . NON FORMULARY Halo beta a sol cream .05% 2-4- x per week as needed     . polyethylene glycol (MIRALAX / GLYCOLAX) packet Take 17 g by mouth as needed for moderate constipation.     . quinapril (ACCUPRIL) 20 MG tablet TAKE 1 TABLET DAILY FOR    BLOOD PRESSURE AND HEART 90 tablet 3  . Red Yeast Rice Extract (RED YEAST RICE PO) Take by mouth daily. 2 DAILY     . triamcinolone cream (KENALOG) 0.1 % Apply 1 application topically as needed (for itching).     . vitamin C (ASCORBIC ACID) 500 MG tablet Take 1,000 mg by mouth daily.      . [DISCONTINUED] darifenacin (ENABLEX) 7.5 MG 24 hr tablet Take 7.5 mg by mouth daily.      No current facility-administered medications for this visit.    Allergies  Allergen Reactions  . Lipitor [Atorvastatin]     Elevated cpk   . Mobic [Meloxicam] Other (See Comments)    edema  . Nsaids Other (See  Comments)    dyspepsia    Review of Systems negative except from HPI and PMH  Physical Exam BP 112/62 mmHg  Pulse 67  Ht 5\' 11"  (1.803 m)  Wt 136 lb 9.6 oz (61.961 kg)  BMI 19.06 kg/m2 Well developed and cachectic older man in no acute distress HENT normal Neck supple with JVP-flat Clear Device pocket well healed; without hematoma or erythema.  There is no tethering  Regular rate and rhythm, no murmurs or gallops Abd-soft with active BS No Clubbing cyanosis edema Skin-warm and dry A & Oriented  Grossly normal sensory and motor function  ECG Apacing  Prior IMI LVH repol   Assessment and  Plan  Ischemic cardiomyopathy Without symptoms of ischemia  Congestive heart failure-chronic-systolic  Euvolemic continue current meds  Implantable  defibrillator-[employer]  The patient's device was interrogated.  The information was reviewed. No changes were made in the programming.

## 2014-03-14 ENCOUNTER — Ambulatory Visit (INDEPENDENT_AMBULATORY_CARE_PROVIDER_SITE_OTHER): Payer: Medicare Other | Admitting: Internal Medicine

## 2014-03-14 ENCOUNTER — Encounter: Payer: Self-pay | Admitting: Internal Medicine

## 2014-03-14 VITALS — BP 110/60 | HR 72 | Temp 97.7°F | Resp 16 | Ht 71.0 in | Wt 135.8 lb

## 2014-03-14 DIAGNOSIS — Z79899 Other long term (current) drug therapy: Secondary | ICD-10-CM

## 2014-03-14 DIAGNOSIS — E782 Mixed hyperlipidemia: Secondary | ICD-10-CM

## 2014-03-14 DIAGNOSIS — I10 Essential (primary) hypertension: Secondary | ICD-10-CM | POA: Diagnosis not present

## 2014-03-14 DIAGNOSIS — R7303 Prediabetes: Secondary | ICD-10-CM

## 2014-03-14 DIAGNOSIS — E559 Vitamin D deficiency, unspecified: Secondary | ICD-10-CM

## 2014-03-14 DIAGNOSIS — R7309 Other abnormal glucose: Secondary | ICD-10-CM | POA: Diagnosis not present

## 2014-03-14 LAB — CBC WITH DIFFERENTIAL/PLATELET
BASOS PCT: 1 % (ref 0–1)
Basophils Absolute: 0.1 10*3/uL (ref 0.0–0.1)
EOS ABS: 0.1 10*3/uL (ref 0.0–0.7)
Eosinophils Relative: 1 % (ref 0–5)
HEMATOCRIT: 35.3 % — AB (ref 39.0–52.0)
HEMOGLOBIN: 12 g/dL — AB (ref 13.0–17.0)
LYMPHS ABS: 1.1 10*3/uL (ref 0.7–4.0)
Lymphocytes Relative: 21 % (ref 12–46)
MCH: 31.9 pg (ref 26.0–34.0)
MCHC: 34 g/dL (ref 30.0–36.0)
MCV: 93.9 fL (ref 78.0–100.0)
MONOS PCT: 9 % (ref 3–12)
MPV: 10.6 fL (ref 8.6–12.4)
Monocytes Absolute: 0.5 10*3/uL (ref 0.1–1.0)
NEUTROS ABS: 3.4 10*3/uL (ref 1.7–7.7)
NEUTROS PCT: 68 % (ref 43–77)
Platelets: 211 10*3/uL (ref 150–400)
RBC: 3.76 MIL/uL — AB (ref 4.22–5.81)
RDW: 13.9 % (ref 11.5–15.5)
WBC: 5 10*3/uL (ref 4.0–10.5)

## 2014-03-14 NOTE — Progress Notes (Signed)
Patient ID: Richard Moody, male   DOB: Feb 10, 1928, 81 y.o.   MRN: [id number]   This very nice 81 y.o.MWM presents for 3 month follow up with Hypertension, ASHD  Hyperlipidemia, Pre-Diabetes and Vitamin D Deficiency.    Patient is treated for HTN (2003) and has ASHD with an AICD followed by Dr Caryl Comes.  BP has been controlled at home. Today's BP: 110/60 mmHg. Patient has had no complaints of any cardiac type chest pain, palpitations, dyspnea/orthopnea/PND, dizziness, claudication, or dependent edema.   Hyperlipidemia is controlled with diet & meds. Patient denies myalgias or other med SE's. Last Lipids were at goal -  Total Chol 150; HDL  65; LDL 71; Trig 72 on 12/07/2013.   Also, the patient has history of PreDiabetes(Dec 2011 with A1c 5.8%)  and has had no symptoms of reactive hypoglycemia, diabetic polys, paresthesias or visual blurring.  Last A1c was  5.8% on  12/07/2013.   Further, the patient also has history of Vitamin D Deficiency of 22 in 2008  and supplements vitamin D without any suspected side-effects. Last vitamin D was  100 on 09/05/2013.   Medication List   aspirin 81 MG tablet  Take 81 mg by mouth 3 (three) times a week.     CITRACAL PLUS PO  Take 1 capsule by mouth daily.     ENABLEX 7.5 MG 24 hr tablet  Generic drug:  darifenacin  TAKE 1 TABLET EVERY DAY FORBLADDER     fexofenadine-pseudoephedrine 60-120 MG per tablet  Commonly known as:  ALLEGRA-D  Take 1 tablet by mouth daily.     fish oil-omega-3 fatty acids 1000 MG capsule  Take 2 g by mouth daily.     fludrocortisone 0.1 MG tablet  Commonly known as:  FLORINEF  Take 1 tablet (0.1 mg total) by mouth 2 (two) times daily.     furosemide 40 MG tablet  Commonly known as:  LASIX  TAKE 1 TABLET TWICE A DAY  FOR BLOOD PRESSURE AND     FLUID     glucosamine-chondroitin 500-400 MG tablet  Take 1 tablet by mouth 2 (two) times daily.     HYDROcodone-acetaminophen 5-325 MG per tablet  Commonly known as:  NORCO  1/2 to 1  tablet 2 x day if needed for pain     IRON PO  Take 45 mg by mouth daily.     magnesium oxide 400 MG tablet  Commonly known as:  MAG-OX  Take 800 mg by mouth daily.     metoprolol succinate 25 MG 24 hr tablet  Commonly known as:  TOPROL-XL  Take 1 tablet (25 mg total) by mouth daily.     NEXIUM 40 MG capsule  Generic drug:  esomeprazole  TAKE 1 CAPSULE DAILY FOR   ACID REFLUX     NON FORMULARY  Halo beta a sol cream .05% 2-4- x per week as needed     polyethylene glycol packet  Commonly known as:  MIRALAX / GLYCOLAX  Take 17 g by mouth as needed for moderate constipation.     quinapril 20 MG tablet  Commonly known as:  ACCUPRIL  TAKE 1 TABLET DAILY FOR    BLOOD PRESSURE AND HEART     RED YEAST RICE PO  Take by mouth daily. 2 DAILY     triamcinolone cream 0.1 %  Commonly known as:  KENALOG  Apply 1 application topically as needed (for itching).     vitamin C 500 MG tablet  Commonly known  as:  ASCORBIC ACID  Take 1,000 mg by mouth daily.     Vitamin D3 5000 UNITS Caps  Take 2 capsules by mouth daily.     Allergies  Allergen Reactions  . Lipitor [Atorvastatin]     Elevated cpk   . Mobic [Meloxicam] Other (See Comments)    edema  . Nsaids Other (See Comments)    dyspepsia   PMHx:   Past Medical History  Diagnosis Date  . Ischemic cardiomyopathy     EF 32%  . OA (osteoarthritis) of knee   . Ganglion cyst of wrist     LEFT WRIST  . MI, old     INFERIOR LATERAL  . Hyperlipidemia   . GERD (gastroesophageal reflux disease)   . ICD (implantable cardiac defibrillator) in place August 2010  . Hx of CABG 1992  . S/P cardiac cath 2006    Occluded SVG to OM. Managed medically  . Normal nuclear stress test June 2010    No ischemia. EF 32%  . Vitamin D deficiency   . Pre-diabetes    Immunization History  Administered Date(s) Administered  . Influenza, High Dose Seasonal PF 12/07/2013  . Influenza-Unspecified 01/01/2013  . Pneumococcal Polysaccharide-23  07/02/2001, 06/01/2013  . Tdap 03/04/2003   Past Surgical History  Procedure Laterality Date  . Cardiac catheterization  10/14/2004    THERE IS POSTERIOR LATERAL HYPOKINESIA. EF 30-35%  . Coronary artery bypass graft  1992  . Knee surgery      RIGHT KNEE  . Cardiac defibrillator placement  10/2008  . Inguinal hernia repair    . Cardiac catheterization  2006    OCCLUDED SVG TO OM  . Nuclear stress test  2010    EF 32%. No ischemia   FHx:    Reviewed / unchanged  SHx:    Reviewed / unchanged  Systems Review:  Constitutional: Denies fever, chills, wt changes, headaches, insomnia, fatigue, night sweats, change in appetite. Eyes: Denies redness, blurred vision, diplopia, discharge, itchy, watery eyes.  ENT: Denies discharge, congestion, post nasal drip, epistaxis, sore throat, earache, hearing loss, dental pain, tinnitus, vertigo, sinus pain, snoring.  CV: Denies chest pain, palpitations, irregular heartbeat, syncope, dyspnea, diaphoresis, orthopnea, PND, claudication or edema. Respiratory: denies cough, dyspnea, DOE, pleurisy, hoarseness, laryngitis, wheezing.  Gastrointestinal: Denies dysphagia, odynophagia, heartburn, reflux, water brash, abdominal pain or cramps, nausea, vomiting, bloating, diarrhea, constipation, hematemesis, melena, hematochezia  or hemorrhoids. Genitourinary: Denies dysuria, frequency, urgency, nocturia, hesitancy, discharge, hematuria or flank pain. Musculoskeletal: c/o ongoing pains of left knee aggrevated by activity as walking. Skin: Denies pruritus, rash, hives, warts, acne, eczema or change in skin lesion(s). Neuro: No weakness, tremor, incoordination, spasms, paresthesia or pain. Psychiatric: Denies confusion, memory loss or sensory loss. Endo: Denies change in weight, skin or hair change.  Heme/Lymph: No excessive bleeding, bruising or enlarged lymph nodes.  Physical Exam  BP 110/60   Pulse 72  Temp 97.7 F   Resp 16  Ht 5\' 11"    Wt 135 lb 12.8 oz     BMI 18.95   Appears thin/frail chronically ill  and in no distress. Eyes: PERRLA, EOMs, conjunctiva no swelling or erythema. Sinuses: No frontal/maxillary tenderness ENT/Mouth: EAC's clear, TM's nl w/o erythema, bulging. Nares clear w/o erythema, swelling, exudates. Oropharynx clear without erythema or exudates. Oral hygiene is good. Tongue normal, non obstructing. Hearing intact.  Neck: Supple. Thyroid nl. Car 2+/2+ without bruits, nodes or JVD. Chest: Pectus carinatum/Barrel chested. Respirations nl with BS clear & equal w/o  rales, rhonchi, wheezing or stridor.  Cor: Heart sounds normal w/ regular rate and rhythm without sig. murmurs, gallops, clicks, or rubs. Peripheral pulses normal and equal  without edema.  Abdomen: Soft & bowel sounds normal. Non-tender w/o guarding, rebound, hernias, masses, or organomegaly.  Lymphatics: Unremarkable.  Musculoskeletal: Full ROM all peripheral extremities, joint stability, 5/5 strength, and normal gait.  Skin: Warm, dry without exposed rashes, lesions or ecchymosis apparent.  Neuro: Cranial nerves intact, reflexes equal bilaterally. Sensory-motor testing grossly intact. Tendon reflexes grossly intact.  Pysch: Alert & oriented x 3.  Insight and judgement nl & appropriate. No ideations.  Assessment and Plan:  1. Hypertension - Continue monitor blood pressure at home. Continue diet/meds same.  2. Hyperlipidemia - Continue diet/meds, exercise,& lifestyle modifications. Continue monitor periodic cholesterol/liver & renal functions   3. Pre-Diabetes - Continue diet, exercise, lifestyle modifications. Monitor appropriate labs.  4. Vitamin D Deficiency - Continue supplementation.  5. ASHD / AICD - f/u w/ Dr Caryl Comes   Recommended regular exercise, BP monitoring, weight control, and discussed med and SE's. Recommended labs to assess and monitor clinical status. Further disposition pending results of labs.

## 2014-03-14 NOTE — Patient Instructions (Signed)

## 2014-03-15 LAB — HEPATIC FUNCTION PANEL
ALK PHOS: 75 U/L (ref 39–117)
ALT: 14 U/L (ref 0–53)
AST: 18 U/L (ref 0–37)
Albumin: 4 g/dL (ref 3.5–5.2)
BILIRUBIN DIRECT: 0.1 mg/dL (ref 0.0–0.3)
BILIRUBIN INDIRECT: 0.5 mg/dL (ref 0.2–1.2)
Total Bilirubin: 0.6 mg/dL (ref 0.2–1.2)
Total Protein: 6.6 g/dL (ref 6.0–8.3)

## 2014-03-15 LAB — BASIC METABOLIC PANEL WITH GFR
BUN: 26 mg/dL — AB (ref 6–23)
CO2: 32 mEq/L (ref 19–32)
Calcium: 9.1 mg/dL (ref 8.4–10.5)
Chloride: 97 mEq/L (ref 96–112)
Creat: 0.88 mg/dL (ref 0.50–1.35)
GFR, Est Non African American: 79 mL/min
GLUCOSE: 91 mg/dL (ref 70–99)
POTASSIUM: 4.3 meq/L (ref 3.5–5.3)
Sodium: 139 mEq/L (ref 135–145)

## 2014-03-15 LAB — MAGNESIUM: MAGNESIUM: 2.1 mg/dL (ref 1.5–2.5)

## 2014-03-15 LAB — LIPID PANEL
CHOLESTEROL: 165 mg/dL (ref 0–200)
HDL: 55 mg/dL (ref >39)
LDL Cholesterol: 94 mg/dL (ref 0–99)
TRIGLYCERIDES: 80 mg/dL (ref <150)
Total CHOL/HDL Ratio: 3 Ratio
VLDL: 16 mg/dL (ref 0–40)

## 2014-03-15 LAB — TSH: TSH: 1.373 u[IU]/mL (ref 0.350–4.500)

## 2014-03-15 LAB — INSULIN, FASTING: INSULIN FASTING, SERUM: 4.8 u[IU]/mL (ref 2.0–19.6)

## 2014-03-15 LAB — HEMOGLOBIN A1C
Hgb A1c MFr Bld: 5.6 % (ref <5.7)
Mean Plasma Glucose: 114 mg/dL (ref <117)

## 2014-03-15 LAB — VITAMIN D 25 HYDROXY (VIT D DEFICIENCY, FRACTURES): Vit D, 25-Hydroxy: 101 ng/mL — ABNORMAL HIGH (ref 30–100)

## 2014-04-18 DIAGNOSIS — H02839 Dermatochalasis of unspecified eye, unspecified eyelid: Secondary | ICD-10-CM | POA: Diagnosis not present

## 2014-04-18 DIAGNOSIS — H18413 Arcus senilis, bilateral: Secondary | ICD-10-CM | POA: Diagnosis not present

## 2014-04-18 DIAGNOSIS — Z961 Presence of intraocular lens: Secondary | ICD-10-CM | POA: Diagnosis not present

## 2014-04-18 DIAGNOSIS — H40003 Preglaucoma, unspecified, bilateral: Secondary | ICD-10-CM | POA: Diagnosis not present

## 2014-06-07 ENCOUNTER — Ambulatory Visit (INDEPENDENT_AMBULATORY_CARE_PROVIDER_SITE_OTHER): Payer: Medicare Other | Admitting: *Deleted

## 2014-06-07 DIAGNOSIS — I255 Ischemic cardiomyopathy: Secondary | ICD-10-CM

## 2014-06-07 DIAGNOSIS — I5022 Chronic systolic (congestive) heart failure: Secondary | ICD-10-CM | POA: Diagnosis not present

## 2014-06-07 LAB — MDC_IDC_ENUM_SESS_TYPE_REMOTE
Brady Statistic AP VP Percent: 0 %
Brady Statistic AP VS Percent: 56.52 %
Brady Statistic AS VP Percent: 0 %
Date Time Interrogation Session: 20160406123007
HighPow Impedance: 39 Ohm
HighPow Impedance: 49 Ohm
Lead Channel Impedance Value: 380 Ohm
Lead Channel Impedance Value: 475 Ohm
Lead Channel Sensing Intrinsic Amplitude: 10.875 mV
Lead Channel Sensing Intrinsic Amplitude: 2.25 mV
Lead Channel Sensing Intrinsic Amplitude: 2.25 mV
MDC IDC MSMT BATTERY VOLTAGE: 2.97 V
MDC IDC MSMT LEADCHNL RV SENSING INTR AMPL: 10.875 mV
MDC IDC SET LEADCHNL RA PACING AMPLITUDE: 2 V
MDC IDC SET LEADCHNL RV PACING AMPLITUDE: 2.5 V
MDC IDC SET LEADCHNL RV PACING PULSEWIDTH: 0.4 ms
MDC IDC SET LEADCHNL RV SENSING SENSITIVITY: 0.3 mV
MDC IDC SET ZONE DETECTION INTERVAL: 350 ms
MDC IDC STAT BRADY AS VS PERCENT: 43.47 %
MDC IDC STAT BRADY RA PERCENT PACED: 56.53 %
MDC IDC STAT BRADY RV PERCENT PACED: 0.01 %
Zone Setting Detection Interval: 270 ms
Zone Setting Detection Interval: 320 ms
Zone Setting Detection Interval: 400 ms

## 2014-06-07 NOTE — Progress Notes (Signed)
Remote ICD transmission.   

## 2014-06-15 ENCOUNTER — Ambulatory Visit: Payer: Self-pay | Admitting: Physician Assistant

## 2014-06-20 ENCOUNTER — Encounter: Payer: Self-pay | Admitting: Internal Medicine

## 2014-06-20 ENCOUNTER — Ambulatory Visit: Payer: Medicare Other | Admitting: Internal Medicine

## 2014-06-20 ENCOUNTER — Ambulatory Visit: Payer: Self-pay | Admitting: Physician Assistant

## 2014-06-20 VITALS — BP 110/54 | HR 64 | Temp 98.2°F | Resp 16 | Ht 71.0 in | Wt 139.0 lb

## 2014-06-20 DIAGNOSIS — E559 Vitamin D deficiency, unspecified: Secondary | ICD-10-CM | POA: Diagnosis not present

## 2014-06-20 DIAGNOSIS — Z9181 History of falling: Secondary | ICD-10-CM

## 2014-06-20 DIAGNOSIS — E782 Mixed hyperlipidemia: Secondary | ICD-10-CM | POA: Diagnosis not present

## 2014-06-20 DIAGNOSIS — I10 Essential (primary) hypertension: Secondary | ICD-10-CM

## 2014-06-20 DIAGNOSIS — J449 Chronic obstructive pulmonary disease, unspecified: Secondary | ICD-10-CM

## 2014-06-20 DIAGNOSIS — Z79899 Other long term (current) drug therapy: Secondary | ICD-10-CM | POA: Diagnosis not present

## 2014-06-20 DIAGNOSIS — E875 Hyperkalemia: Secondary | ICD-10-CM | POA: Insufficient documentation

## 2014-06-20 DIAGNOSIS — Z0001 Encounter for general adult medical examination with abnormal findings: Secondary | ICD-10-CM | POA: Diagnosis not present

## 2014-06-20 DIAGNOSIS — I255 Ischemic cardiomyopathy: Secondary | ICD-10-CM

## 2014-06-20 DIAGNOSIS — R7309 Other abnormal glucose: Secondary | ICD-10-CM | POA: Diagnosis not present

## 2014-06-20 DIAGNOSIS — K219 Gastro-esophageal reflux disease without esophagitis: Secondary | ICD-10-CM | POA: Insufficient documentation

## 2014-06-20 DIAGNOSIS — Z1331 Encounter for screening for depression: Secondary | ICD-10-CM

## 2014-06-20 DIAGNOSIS — Z95810 Presence of automatic (implantable) cardiac defibrillator: Secondary | ICD-10-CM

## 2014-06-20 DIAGNOSIS — R6889 Other general symptoms and signs: Secondary | ICD-10-CM | POA: Diagnosis not present

## 2014-06-20 DIAGNOSIS — R7303 Prediabetes: Secondary | ICD-10-CM

## 2014-06-20 DIAGNOSIS — Z0000 Encounter for general adult medical examination without abnormal findings: Secondary | ICD-10-CM

## 2014-06-20 LAB — BASIC METABOLIC PANEL WITH GFR
BUN: 36 mg/dL — AB (ref 6–23)
CHLORIDE: 100 meq/L (ref 96–112)
CO2: 32 mEq/L (ref 19–32)
CREATININE: 1.01 mg/dL (ref 0.50–1.35)
Calcium: 9 mg/dL (ref 8.4–10.5)
GFR, EST NON AFRICAN AMERICAN: 68 mL/min
GFR, Est African American: 79 mL/min
Glucose, Bld: 99 mg/dL (ref 70–99)
Potassium: 4.5 mEq/L (ref 3.5–5.3)
Sodium: 138 mEq/L (ref 135–145)

## 2014-06-20 LAB — LIPID PANEL
CHOLESTEROL: 144 mg/dL (ref 0–200)
HDL: 61 mg/dL (ref >=40)
LDL Cholesterol: 66 mg/dL (ref 0–99)
TRIGLYCERIDES: 84 mg/dL (ref <150)
Total CHOL/HDL Ratio: 2.4 Ratio
VLDL: 17 mg/dL (ref 0–40)

## 2014-06-20 LAB — CBC WITH DIFFERENTIAL/PLATELET
Basophils Absolute: 0 10*3/uL (ref 0.0–0.1)
Basophils Relative: 0 % (ref 0–1)
Eosinophils Absolute: 0.1 10*3/uL (ref 0.0–0.7)
Eosinophils Relative: 2 % (ref 0–5)
HCT: 31.1 % — ABNORMAL LOW (ref 39.0–52.0)
Hemoglobin: 10.8 g/dL — ABNORMAL LOW (ref 13.0–17.0)
LYMPHS ABS: 1 10*3/uL (ref 0.7–4.0)
LYMPHS PCT: 21 % (ref 12–46)
MCH: 32.6 pg (ref 26.0–34.0)
MCHC: 34.7 g/dL (ref 30.0–36.0)
MCV: 94 fL (ref 78.0–100.0)
MONO ABS: 0.4 10*3/uL (ref 0.1–1.0)
MONOS PCT: 9 % (ref 3–12)
MPV: 10.3 fL (ref 8.6–12.4)
NEUTROS ABS: 3.1 10*3/uL (ref 1.7–7.7)
Neutrophils Relative %: 68 % (ref 43–77)
Platelets: 210 10*3/uL (ref 150–400)
RBC: 3.31 MIL/uL — ABNORMAL LOW (ref 4.22–5.81)
RDW: 12.9 % (ref 11.5–15.5)
WBC: 4.6 10*3/uL (ref 4.0–10.5)

## 2014-06-20 LAB — TSH: TSH: 1.093 u[IU]/mL (ref 0.350–4.500)

## 2014-06-20 LAB — HEPATIC FUNCTION PANEL
ALT: 12 U/L (ref 0–53)
AST: 16 U/L (ref 0–37)
Albumin: 3.6 g/dL (ref 3.5–5.2)
Alkaline Phosphatase: 67 U/L (ref 39–117)
BILIRUBIN INDIRECT: 0.4 mg/dL (ref 0.2–1.2)
BILIRUBIN TOTAL: 0.5 mg/dL (ref 0.2–1.2)
Bilirubin, Direct: 0.1 mg/dL (ref 0.0–0.3)
Total Protein: 5.9 g/dL — ABNORMAL LOW (ref 6.0–8.3)

## 2014-06-20 LAB — MAGNESIUM: Magnesium: 2.3 mg/dL (ref 1.5–2.5)

## 2014-06-20 NOTE — Patient Instructions (Signed)

## 2014-06-20 NOTE — Progress Notes (Signed)
Patient ID: Richard Moody, male   DOB: 1927-09-13, 81 y.o.   MRN: [id number]  MEDICARE ANNUAL WELLNESS VISIT AND OV  Assessment:   1. Essential hypertension  - TSH  2. Hyperlipidemia  - Lipid panel  3. Prediabetes  - Hemoglobin A1c - Insulin, random  4. Vitamin D deficiency  - Vit D  25 hydroxy  5. ASCAD s/p MI, Stents, CABG & AICD/ Defib   6. Automatic implantable cardioverter-defibrillator in situ   7. Obstructive chronic bronchitis without exacerbation   8. Gastroesophageal reflux disease   9. Medication management - CBC with Differential/Platelet - BASIC METABOLIC PANEL WITH GFR - Hepatic function panel - Magnesium  10. Routine general medical examination at a health care facility   11. Depression screen   12. At moderate risk for fall   Plan:   During the course of the visit the patient was educated and counseled about appropriate screening and preventive services including:    Pneumococcal vaccine   Influenza vaccine  Td vaccine  Screening electrocardiogram  Bone densitometry screening  Colorectal cancer screening  Diabetes screening  Glaucoma screening  Nutrition counseling   Advanced directives: requested  Screening recommendations, referrals: Vaccinations: Immunization History  Administered Date(s) Administered  . Influenza, High Dose Seasonal PF 12/07/2013  . Influenza-Unspecified 01/01/2013  . Pneumococcal Polysaccharide-23 07/02/2001, 06/01/2013  . Tdap 03/04/2003  Prevnar vaccine undecided Shingles vaccine undecided Hep B vaccine not indicated  Nutrition assessed and recommended  Colonoscopy 2009 Recommended yearly ophthalmology/optometry visit for glaucoma screening and checkup Recommended yearly dental visit for hygiene and checkup Advanced directives - yes  Conditions/risks identified: BMI: Discussed weight loss, diet, and increase physical activity.  Increase physical activity: AHA recommends 150 minutes  of physical activity a week.  Medications reviewed PreDiabetes is at goal, ACE/ARB therapy: Yes. Urinary Incontinence is not an issue: discussed non pharmacology and pharmacology options.  Fall risk: moderate- discussed PT, home fall assessment, medications.   Subjective:    Richard Moody is a 81 y.o. MWM who presents for Medicare Annual Wellness Visit and OV.  Date of last medicare wellness visit was 06/01/2013. This very nice 81 y.o. MWM presents for 3 month follow up with Hypertension, ASCAD Hyperlipidemia, Pre-Diabetes and Vitamin D Deficiency.    Patient is treated for HTN since 2003 & BP has been controlled at home. Today's BP: (!) 110/54 mmHg. Patient had a MI  And angioplasty in 1989 and then in 1992 underwent CABG. In 2012 he was dx'd with CHF and had a AICD/Defib placed. Patient has had no complaints of any cardiac type chest pain, palpitations, dyspnea/orthopnea/PND, dizziness, claudication, or dependent edema. Patient also has moderate COPD with DOE and denies any significant coughing.    Hyperlipidemia is controlled with diet & meds. Patient denies myalgias or other med SE's. Last Lipids were at goal -  Total  Chol 144; HDL 61; LDL  66; Trig 84 on 06/20/2014.   Also, the patient has history of PreDiabetes with A1c 5.8% predating since Dec 2011 and has had no symptoms of reactive hypoglycemia, diabetic polys, paresthesias or visual blurring.  Last A1c was 5.6% on 03/14/2014.   Further, the patient also has history of Vitamin D Deficiency and supplements vitamin D without any suspected side-effects. Last vitamin D was 101 on  03/14/2014.  Names of Other Physician/Practitioners you currently use: 1. [facility] here for primary care 2. Dr Talbert [place], eye doctor, last visit Feb 2016 3. Dr Baird Cancer, [place], dentist, last  visit )06/2013  Patient Care Team: Unk Pinto, MD as PCP - General (Internal Medicine) Unk Pinto, MD (Internal  Medicine) Darleen Crocker, MD as Consulting Physician (Ophthalmology) Deboraha Sprang, MD as Consulting Physician (Cardiology)  Medication Review: Medication Sig  . ALPRAZolam (XANAX) 1 MG tablet 1/2 to 1 tablet 3 x daily as needed for anxiety or sleep  . aspirin 81 MG tablet Take 81 mg by mouth 3 (three) times a week.    . Cholecalciferol (VITAMIN D3) 5000 UNITS CAPS Take 2 capsules by mouth daily.    . ENABLEX 7.5 MG 24 hr tablet TAKE 1 TABLET EVERY DAY FORBLADDER  . fexofenadine-pseudoephedrine (ALLEGRA-D) 60-120 MG per tablet Take 1 tablet by mouth daily.   . fish oil-omega-3 fatty acids 1000 MG capsule Take 2 g by mouth daily.    . fludrocortisone (FLORINEF) 0.1 MG tablet Take 1 tablet (0.1 mg total) by mouth 2 (two) times daily.  . furosemide (LASIX) 40 MG tablet TAKE 1 TABLET TWICE A DAY  FOR BLOOD PRESSURE AND     FLUID  . glucosamine-chondroitin 500-400 MG tablet Take 1 tablet by mouth 2 (two) times daily.    Marland Kitchen HYDROcodone-acetaminophen (NORCO) 5-325 MG per tablet 1/2 to 1 tablet 2 x day if needed for pain  . IRON PO Take 45 mg by mouth daily.   . magnesium oxide (MAG-OX) 400 MG tablet Take 800 mg by mouth daily.  . metoprolol succinate (TOPROL-XL) 25 MG 24 hr tablet Take 1 tablet (25 mg total) by mouth daily.  . Multiple Minerals-Vitamins (CITRACAL PLUS PO) Take 1 capsule by mouth daily.   Marland Kitchen NEXIUM 40 MG capsule TAKE 1 CAPSULE DAILY FOR   ACID REFLUX  . NON FORMULARY Halo beta a sol cream .05% 2-4- x per week as needed   . OVER THE COUNTER MEDICATION claritin 10 mg daily PRN  . polyethylene glycol (MIRALAX / GLYCOLAX) packet Take 17 g by mouth as needed for moderate constipation.   . quinapril (ACCUPRIL) 20 MG tablet TAKE 1 TABLET DAILY FOR    BLOOD PRESSURE AND HEART  . Red Yeast Rice Extract (RED YEAST RICE PO) Take by mouth daily. 2 DAILY   . triamcinolone cream (KENALOG) 0.1 % Apply 1 application topically as needed (for itching).   . vitamin C (ASCORBIC ACID) 500 MG tablet Take  1,000 mg by mouth daily.     Current Problems (verified) Patient Active Problem List   Diagnosis Date Noted  . GERD  06/20/2014  . Hyperkalemia (Hyporeninemic HypoAldosteronism) 06/20/2014  . Medication management 12/20/2013  . Hyperlipidemia 09/05/2013  . Essential hypertension 02/27/2013  . Prediabetes 02/27/2013  . Vitamin D deficiency 02/27/2013  . Obstructive chronic bronchitis without exacerbation 02/27/2013  . Chronic systolic CHF (EF 42%) 70/62/3762  . Automatic implantable cardioverter-defibrillator in situ 04/08/2010  . ASCAD s/p CABG (1992) 09/29/2008   Screening Tests Health Maintenance  Topic Date Due  . COLONOSCOPY  11/05/1979  . ZOSTAVAX  11/04/1989  . TETANUS/TDAP  03/03/2013  . PNA vac Low Risk Adult (2 of 2 - PCV13) 06/02/2014  . INFLUENZA VACCINE  10/02/2014   Immunization History  Administered Date(s) Administered  . Influenza, High Dose Seasonal PF 12/07/2013  . Influenza-Unspecified 01/01/2013  . Pneumococcal Polysaccharide-23 07/02/2001, 06/01/2013  . Tdap 03/04/2003    Preventative care: Last colonoscopy: 2009  History reviewed: allergies, current medications, past family history, past medical history, past social history, past surgical history and problem list  Risk Factors: Tobacco History  Substance Use  Topics  . Smoking status: Former Smoker    Quit date: 03/03/1982  . Smokeless tobacco: Never Used  . Alcohol Use: No   He does not smoke.  Patient is a former smoker. Are there smokers in your home (other than you)?  No  Alcohol Current alcohol use: none  Caffeine Current caffeine use: rare  Exercise Current exercise: walking  Nutrition/Diet Current diet: in general, a "healthy" diet    Cardiac risk factors: advanced age (older than 65 for men, 57 for women), dyslipidemia, hypertension, male gender, sedentary lifestyle and smoking/ tobacco exposure.  Depression Screen (Note: if answer to either of the following is "Yes", a  more complete depression screening is indicated)   Q1: Over the past two weeks, have you felt down, depressed or hopeless? No  Q2: Over the past two weeks, have you felt little interest or pleasure in doing things? No  Have you lost interest or pleasure in daily life? No  Do you often feel hopeless? No  Do you cry easily over simple problems? No  Activities of Daily Living In your present state of health, do you have any difficulty performing the following activities?:  Driving? No Managing money?  No Feeding yourself? No Getting from bed to chair? No Climbing a flight of stairs? No Preparing food and eating?: No Bathing or showering? No Getting dressed: No Getting to the toilet? No Using the toilet:No Moving around from place to place: No In the past year have you fallen or had a near fall?:No   Are you sexually active?  No  Do you have more than one partner?  No  Vision Difficulties: No  Hearing Difficulties: No Do you often ask people to speak up or repeat themselves? No Do you experience ringing or noises in your ears? No Do you have difficulty understanding soft or whispered voices? No  Cognition  Do you feel that you have a problem with memory?No  Do you often misplace items? No  Do you feel safe at home?  Yes  Advanced directives Does patient have a [place]? Yes Does patient have a Living Will? Yes  Past Medical History  Diagnosis Date  . Ischemic cardiomyopathy     EF 32%  . OA (osteoarthritis) of knee   . Ganglion cyst of wrist     LEFT WRIST  . MI, old     INFERIOR LATERAL  . Hyperlipidemia   . GERD (gastroesophageal reflux disease)   . ICD (implantable cardiac defibrillator) in place August 2010  . Hx of CABG 1992  . S/P cardiac cath 2006    Occluded SVG to OM. Managed medically  . Normal nuclear stress test June 2010    No ischemia. EF 32%  . Vitamin D deficiency   . Pre-diabetes     Past Surgical History  Procedure  Laterality Date  . Cardiac catheterization  10/14/2004    THERE IS POSTERIOR LATERAL HYPOKINESIA. EF 30-35%  . Coronary artery bypass graft  1992  . Knee surgery      RIGHT KNEE  . Cardiac defibrillator placement  10/2008  . Inguinal hernia repair    . Cardiac catheterization  2006    OCCLUDED SVG TO OM  . Nuclear stress test  2010    EF 32%. No ischemia    ROS: Constitutional: Denies fever, chills, weight loss/gain, headaches, insomnia, fatigue, night sweats or change in appetite. Eyes: Denies redness, blurred vision, diplopia, discharge, itchy or watery eyes.  ENT: Denies discharge, congestion, post nasal drip, epistaxis, sore throat, earache, hearing loss, dental pain, Tinnitus, Vertigo, Sinus pain or snoring.  Cardio: Denies chest pain, palpitations, irregular heartbeat, syncope, dyspnea, diaphoresis, orthopnea, PND, claudication or edema Respiratory: denies cough, dyspnea, DOE, pleurisy, hoarseness, laryngitis or wheezing.  Gastrointestinal: Denies dysphagia, heartburn, reflux, water brash, pain, cramps, nausea, vomiting, bloating, diarrhea, constipation, hematemesis, melena, hematochezia, jaundice or hemorrhoids Genitourinary: Denies dysuria, frequency, urgency, nocturia, hesitancy, discharge, hematuria or flank pain Musculoskeletal: Denies arthralgia, myalgia, stiffness, Jt. Swelling, pain, limp or strain/sprain. Denies Falls. Skin: Denies puritis, rash, hives, warts, acne, eczema or change in skin lesion Neuro: No weakness, tremor, incoordination, spasms, paresthesia or pain Psychiatric: Denies confusion, memory loss or sensory loss. Denies Depression. Endocrine: Denies change in weight, skin, hair change, nocturia, and paresthesia, diabetic polys, visual blurring or hyper / hypo glycemic episodes.  Heme/Lymph: No excessive bleeding, bruising or enlarged lymph nodes.  Objective:     BP 110/54   Pulse 64  Temp 98.2 F    Resp 16  Ht 5\' 11"    Wt 139 lb     BMI 19.40    General Appearance:  Alert  WD/WN, male  in no apparent distress. Eyes: PERRLA, EOMs nl, conjunctiva normal, normal fundi and vessels. Sinuses: No frontal/maxillary tenderness ENT/Mouth: EACs patent / TMs  nl. Nares clear without erythema, swelling, mucoid exudates. Oral hygiene is good. No erythema, swelling, or exudate. Tongue normal, non-obstructing. Tonsils not swollen or erythematous. Hearing normal.  Neck: Supple, thyroid normal. No bruits, nodes or JVD. Respiratory: Respiratory effort normal.  BS equal and decreased  bilateral without rales, rhonci, wheezing or stridor. Cardio: Heart sounds are normal with regular rate and rhythm and no murmurs, rubs or gallops. Peripheral pulses are normal and equal bilaterally without edema. No aortic or femoral bruits. Chest: Barrel configured with median sternotomy scar.  Abdomen: Flat, soft, with nl bowel sounds. Nontender, no guarding, rebound, hernias, masses, or organomegaly.  Lymphatics: Non tender without lymphadenopathy.  Musculoskeletal: Full ROM all peripheral extremities, joint stability, 5/5 strength, and normal gait. Skin: Warm and dry without rashes, lesions, cyanosis, clubbing or  ecchymosis.  Neuro: Cranial nerves intact, reflexes equal bilaterally. Normal muscle tone, no cerebellar symptoms. Sensation intact.  Pysch: Alert and oriented X 3 with normal affect, insight and judgment appropriate.   Cognitive Testing  Alert? Yes  Normal Appearance? Yes  Oriented to person? Yes  Place? Yes   Time? Yes  Recall of three objects?  Yes  Can perform simple calculations? Yes  Displays appropriate judgment? Yes  Can read the correct time from a watch/clock? Yes  Medicare Attestation I have personally reviewed: The patient's medical and social history Their use of alcohol, tobacco or illicit drugs Their current medications and supplements The patient's functional ability including ADLs,fall risks, home safety risks, cognitive, and  hearing and visual impairment Diet and physical activities Evidence for depression or mood disorders  The patient's weight, height, BMI, and visual acuity have been recorded in the chart.  I have made referrals, counseling, and provided education to the patient based on review of the above and I have provided the patient with a written personalized care plan for preventive services.  Over 40 minutes of exam, counseling, chart review was performed.  Marcas Bowsher DAVID, MD   06/20/2014

## 2014-06-21 LAB — HEMOGLOBIN A1C
HEMOGLOBIN A1C: 5.7 % — AB (ref <5.7)
Mean Plasma Glucose: 117 mg/dL — ABNORMAL HIGH (ref <117)

## 2014-06-21 LAB — VITAMIN D 25 HYDROXY (VIT D DEFICIENCY, FRACTURES): VIT D 25 HYDROXY: 99 ng/mL (ref 30–100)

## 2014-06-21 LAB — INSULIN, RANDOM: INSULIN: 6.4 u[IU]/mL (ref 2.0–19.6)

## 2014-07-11 ENCOUNTER — Encounter: Payer: Self-pay | Admitting: Cardiology

## 2014-07-13 DIAGNOSIS — D692 Other nonthrombocytopenic purpura: Secondary | ICD-10-CM | POA: Diagnosis not present

## 2014-07-13 DIAGNOSIS — L570 Actinic keratosis: Secondary | ICD-10-CM | POA: Diagnosis not present

## 2014-07-13 DIAGNOSIS — L720 Epidermal cyst: Secondary | ICD-10-CM | POA: Diagnosis not present

## 2014-07-13 DIAGNOSIS — L309 Dermatitis, unspecified: Secondary | ICD-10-CM | POA: Diagnosis not present

## 2014-07-14 ENCOUNTER — Encounter: Payer: Self-pay | Admitting: Internal Medicine

## 2014-07-26 ENCOUNTER — Other Ambulatory Visit: Payer: Self-pay | Admitting: *Deleted

## 2014-07-26 ENCOUNTER — Other Ambulatory Visit: Payer: Self-pay | Admitting: Physician Assistant

## 2014-07-26 MED ORDER — METOPROLOL SUCCINATE ER 25 MG PO TB24: 25.0000 mg | ORAL_TABLET | Freq: Every day | ORAL | Status: DC

## 2014-08-22 ENCOUNTER — Other Ambulatory Visit: Payer: Self-pay | Admitting: Physician Assistant

## 2014-09-01 ENCOUNTER — Ambulatory Visit
Admission: RE | Admit: 2014-09-01 | Discharge: 2014-09-01 | Disposition: A | Discharge status: Home / Self Care | Payer: Medicare Other | Source: Ambulatory Visit | Attending: Nurse Practitioner | Admitting: Nurse Practitioner

## 2014-09-01 ENCOUNTER — Encounter: Payer: Self-pay | Admitting: Nurse Practitioner

## 2014-09-01 ENCOUNTER — Ambulatory Visit (INDEPENDENT_AMBULATORY_CARE_PROVIDER_SITE_OTHER): Payer: Medicare Other | Admitting: Nurse Practitioner

## 2014-09-01 VITALS — BP 100/56 | HR 68 | Ht 71.0 in | Wt 136.0 lb

## 2014-09-01 DIAGNOSIS — K449 Diaphragmatic hernia without obstruction or gangrene: Secondary | ICD-10-CM | POA: Diagnosis not present

## 2014-09-01 DIAGNOSIS — I5022 Chronic systolic (congestive) heart failure: Secondary | ICD-10-CM

## 2014-09-01 DIAGNOSIS — I255 Ischemic cardiomyopathy: Secondary | ICD-10-CM

## 2014-09-01 DIAGNOSIS — R6251 Failure to thrive (child): Secondary | ICD-10-CM

## 2014-09-01 DIAGNOSIS — R634 Abnormal weight loss: Secondary | ICD-10-CM | POA: Diagnosis not present

## 2014-09-01 DIAGNOSIS — J449 Chronic obstructive pulmonary disease, unspecified: Secondary | ICD-10-CM | POA: Diagnosis not present

## 2014-09-01 DIAGNOSIS — N400 Enlarged prostate without lower urinary tract symptoms: Secondary | ICD-10-CM

## 2014-09-01 DIAGNOSIS — R351 Nocturia: Secondary | ICD-10-CM

## 2014-09-01 LAB — CBC WITH DIFFERENTIAL/PLATELET
Basophils Absolute: 0 10*3/uL (ref 0.0–0.1)
Basophils Relative: 0.2 % (ref 0.0–3.0)
Eosinophils Absolute: 0.1 10*3/uL (ref 0.0–0.7)
Eosinophils Relative: 2.9 % (ref 0.0–5.0)
HCT: 33.5 % — ABNORMAL LOW (ref 39.0–52.0)
Hemoglobin: 11.2 g/dL — ABNORMAL LOW (ref 13.0–17.0)
Lymphocytes Relative: 23.8 % (ref 12.0–46.0)
Lymphs Abs: 1.2 10*3/uL (ref 0.7–4.0)
MCHC: 33.4 g/dL (ref 30.0–36.0)
MCV: 96.4 fl (ref 78.0–100.0)
Monocytes Absolute: 0.4 10*3/uL (ref 0.1–1.0)
Monocytes Relative: 8.7 % (ref 3.0–12.0)
Neutro Abs: 3.3 10*3/uL (ref 1.4–7.7)
Neutrophils Relative %: 64.4 % (ref 43.0–77.0)
Platelets: 181 10*3/uL (ref 150.0–400.0)
RBC: 3.48 Mil/uL — ABNORMAL LOW (ref 4.22–5.81)
RDW: 13.6 % (ref 11.5–15.5)
WBC: 5.1 10*3/uL (ref 4.0–10.5)

## 2014-09-01 LAB — COMPREHENSIVE METABOLIC PANEL
ALT: 18 U/L (ref 0–53)
AST: 19 U/L (ref 0–37)
Albumin: 3.7 g/dL (ref 3.5–5.2)
Alkaline Phosphatase: 76 U/L (ref 39–117)
BUN: 24 mg/dL — ABNORMAL HIGH (ref 6–23)
CO2: 32 mEq/L (ref 19–32)
Calcium: 9 mg/dL (ref 8.4–10.5)
Chloride: 102 mEq/L (ref 96–112)
Creatinine, Ser: 0.93 mg/dL (ref 0.40–1.50)
GFR: 82.11 mL/min (ref >60.00)
Glucose, Bld: 106 mg/dL — ABNORMAL HIGH (ref 70–99)
Potassium: 4 mEq/L (ref 3.5–5.1)
Sodium: 140 mEq/L (ref 135–145)
Total Bilirubin: 0.6 mg/dL (ref 0.2–1.2)
Total Protein: 6.3 g/dL (ref 6.0–8.3)

## 2014-09-01 LAB — TSH: TSH: 1.25 u[IU]/mL (ref 0.35–4.50)

## 2014-09-01 LAB — PSA: PSA: 0 ng/mL — ABNORMAL LOW (ref 0.10–4.00)

## 2014-09-01 LAB — MAGNESIUM: Magnesium: 2 mg/dL (ref 1.5–2.5)

## 2014-09-01 MED ORDER — FUROSEMIDE 40 MG PO TABS: ORAL_TABLET | ORAL | Status: DC

## 2014-09-01 MED ORDER — QUINAPRIL HCL 20 MG PO TABS: ORAL_TABLET | ORAL | Status: DC

## 2014-09-01 NOTE — Progress Notes (Addendum)
CARDIOLOGY OFFICE NOTE  Date:  09/01/2014    Richard Moody Date of Birth: 1927-10-14 Medical Record #[id number]  PCP:  Alesia Richards, MD  Cardiologist:  Caryl Comes    Chief Complaint  Patient presents with  . Cardiomyopathy    Follow up visit - seen for Dr. Caryl Comes    History of Present Illness: Richard Moody is a 81 y.o. male who presents today for a 6 month check. Seen for Dr. Caryl Comes. He is a former patient of Dr. Susa Simmonds that I used to follow. He has an ischemic CM, has ICD in place since 2010, CAD with remote MI in 1989, 1992 and prior CABG x 4 in 1992, depressed left ventricular function with EFof 32% and HLD with statin intolerance. Other issues as noted below.   I have not seen him in 4 years.  Last saw Dr. Caryl Comes in January - felt to be stable.   Comes in today. Here with his wife Judson Roch that sees Dr. Mare Ferrari. She pulled me to the side before I went in the room to report that he is losing weight, can't gain weight, contrary and ?demented.  He stays cold despite their thermostat being on 80. Has lots of OA complaints. She wanted him seen to see if this could get sorted out.  He has lost weight. He weighed 164 when I saw him 4 years ago. Says he eats. No pain anywhere except his arthritis. Not short of breath. Chronic swelling in his legs - wears his support stockings but sits with his legs down most of the day. On Florinef - not clear why?? BP low. He has not fallen. Says he is not dizzy. He got lost driving several weeks ago - says he "got turned around".   Past Medical History  Diagnosis Date  . Ischemic cardiomyopathy     EF 32%  . OA (osteoarthritis) of knee   . Ganglion cyst of wrist     LEFT WRIST  . MI, old     INFERIOR LATERAL  . Hyperlipidemia   . GERD (gastroesophageal reflux disease)   . ICD (implantable cardiac defibrillator) in place August 2010  . Hx of CABG 1992  . S/P cardiac cath 2006    Occluded SVG to OM. Managed medically  . Normal  nuclear stress test June 2010    No ischemia. EF 32%  . Vitamin D deficiency   . Pre-diabetes     Past Surgical History  Procedure Laterality Date  . Cardiac catheterization  10/14/2004    THERE IS POSTERIOR LATERAL HYPOKINESIA. EF 30-35%  . Coronary artery bypass graft  1992  . Knee surgery      RIGHT KNEE  . Cardiac defibrillator placement  10/2008  . Inguinal hernia repair    . Cardiac catheterization  2006    OCCLUDED SVG TO OM  . Nuclear stress test  2010    EF 32%. No ischemia     Medications: Current Outpatient Prescriptions  Medication Sig Dispense Refill  . ALPRAZolam (XANAX) 1 MG tablet 1/2 to 1 tablet 3 x daily as needed for anxiety or sleep 270 tablet 1  . aspirin 81 MG tablet Take 81 mg by mouth 3 (three) times a week.      . Cholecalciferol (VITAMIN D3) 5000 UNITS CAPS Take 2 capsules by mouth daily.      Marland Kitchen darifenacin (ENABLEX) 7.5 MG 24 hr tablet TAKE 1 TABLET EVERY DAY FORBLADDER 90 tablet 99  . fexofenadine-pseudoephedrine (  ALLEGRA-D) 60-120 MG per tablet Take 1 tablet by mouth daily.     . fish oil-omega-3 fatty acids 1000 MG capsule Take 2 g by mouth daily.      . fludrocortisone (FLORINEF) 0.1 MG tablet Take 1 tablet (0.1 mg total) by mouth 2 (two) times daily. 180 tablet 1  . furosemide (LASIX) 40 MG tablet TAKE 1 TABLET TWICE A DAY  FOR BLOOD PRESSURE AND     FLUID 180 tablet 1  . glucosamine-chondroitin 500-400 MG tablet Take 1 tablet by mouth 2 (two) times daily.      . IRON PO Take 45 mg by mouth daily.     . Magnesium 250 MG TABS Take by mouth 2 (two) times daily.    . metoprolol succinate (TOPROL-XL) 25 MG 24 hr tablet Take 1 tablet (25 mg total) by mouth daily. 90 tablet 3  . Multiple Minerals-Vitamins (CITRACAL PLUS PO) Take 1 capsule by mouth daily.     . NON FORMULARY Halo beta a sol cream .05% 2-4- x per week as needed     . OVER THE COUNTER MEDICATION claritin 10 mg daily PRN    . polyethylene glycol (MIRALAX / GLYCOLAX) packet Take 17 g by  mouth as needed for moderate constipation.     . quinapril (ACCUPRIL) 20 MG tablet TAKE 1 TABLET DAILY FOR    BLOOD PRESSURE AND HEART 90 tablet 3  . Red Yeast Rice Extract (RED YEAST RICE PO) Take by mouth daily. 2 DAILY     . triamcinolone cream (KENALOG) 0.1 % Apply 1 application topically as needed (for itching).     . vitamin C (ASCORBIC ACID) 500 MG tablet Take 1,000 mg by mouth daily.       No current facility-administered medications for this visit.    Allergies: Allergies  Allergen Reactions  . Lipitor [Atorvastatin]     Elevated cpk   . Mobic [Meloxicam] Other (See Comments)    edema  . Nsaids Other (See Comments)    dyspepsia    Social History: The patient  reports that he quit smoking about 32 years ago. He has never used smokeless tobacco. He reports that he does not drink alcohol or use illicit drugs.   Family History: The patient's family history includes Cancer in his mother and sister; Hypertension in his father; Stroke in his father.   Review of Systems: Please see the history of present illness.   Otherwise, the review of systems is positive for getting up at night to void - would assume he has an enlarged prostate.    All other systems are reviewed and negative.   Physical Exam: VS:  BP 100/56 mmHg  Pulse 68  Ht 5\' 11"  (1.803 m)  Wt 136 lb (61.689 kg)  BMI 18.98 kg/m2 .  BMI Body mass index is 18.98 kg/(m^2).  Wt Readings from Last 3 Encounters:  09/01/14 136 lb (61.689 kg)  06/20/14 139 lb (63.05 kg)  03/14/14 135 lb 12.8 oz (61.598 kg)    General: He looks quite frail, almost yellow looking but in no acute distress. He weighed 164 4 years ago. Very cachetic in appearance and gaunt.   HEENT: Normal. Neck: Supple, no JVD, carotid bruits, or masses noted.  Cardiac: Regular rate and rhythm. No murmurs, rubs, or gallops. +1 edema.  Respiratory:  Lungs are clear to auscultation bilaterally with normal work of breathing.  GI: Soft and nontender.  MS: No  deformity or atrophy. Gait and ROM intact. Skin:  Warm and dry. Color is normal.  Neuro:  Strength and sensation are intact and no gross focal deficits noted.  Psych: Alert, appropriate and with normal affect.   LABORATORY DATA:  EKG:  EKG is ordered today. This demonstrates A pacing.  Lab Results  Component Value Date   WBC 4.6 06/20/2014   HGB 10.8* 06/20/2014   HCT 31.1* 06/20/2014   PLT 210 06/20/2014   GLUCOSE 99 06/20/2014   CHOL 144 06/20/2014   TRIG 84 06/20/2014   HDL 61 06/20/2014   LDLCALC 66 06/20/2014   ALT 12 06/20/2014   AST 16 06/20/2014   NA 138 06/20/2014   K 4.5 06/20/2014   CL 100 06/20/2014   CREATININE 1.01 06/20/2014   BUN 36* 06/20/2014   CO2 32 06/20/2014   TSH 1.093 06/20/2014   PSA <0.01 09/05/2013   INR 1.0 10/05/2008   HGBA1C 5.7* 06/20/2014   MICROALBUR 0.52 09/05/2013    BNP (last 3 results) No results for input(s): BNP in the last 8760 hours.  ProBNP (last 3 results) No results for input(s): PROBNP in the last 8760 hours.   Other Studies Reviewed Today:   Assessment/Plan: 1. Ischemic CM  2. Underlying ICD  3. CAD - no active chest pain  4. HLD - on red yeast rice only.   5. Significant weight loss/cachexia - this is the most pressing issue. ? If this is cardiac cachexia or if he has some malignant process. Will check labs today. Arrange for echo. May need to consider CT scanning to further define. BP low - I have cut his Lasix back and his ACE. Further disposition to follow.   Current medicines are reviewed with the patient today.  The patient does not have concerns regarding medicines other than what has been noted above.  The following changes have been made:  See above.  Labs/ tests ordered today include:   No orders of the defined types were placed in this encounter.     Disposition:   Further disposition to follow.    Patient is agreeable to this plan and will call if any problems develop in the interim.    Signed: Burtis Junes, RN, ANP-C 09/01/2014 1:57 PM  [facility] [address] [place], [place]  [address] Phone: [phone] Fax: [phone]

## 2014-09-01 NOTE — Patient Instructions (Addendum)
We will be checking the following labs today - CMET, CBC with diff, MG, TSH, PSA,    Medication Instructions:    Continue with your current medicines but  I am cutting the Lasix back to just one a day  I am cutting the Quinapril in half     Testing/Procedures To Be Arranged:  Echocardiogram Please go to [facility] [place] on the first floor for a chest Xray - you may walk in.    Follow-Up:  Let me see how your tests turn out - then I will decide about when to come back.  Other Special Instructions:   N/A  Call the [facility] office at [phone] if you have any questions, problems or concerns.

## 2014-09-06 ENCOUNTER — Ambulatory Visit (INDEPENDENT_AMBULATORY_CARE_PROVIDER_SITE_OTHER): Payer: Medicare Other | Admitting: *Deleted

## 2014-09-06 DIAGNOSIS — I5022 Chronic systolic (congestive) heart failure: Secondary | ICD-10-CM

## 2014-09-06 DIAGNOSIS — I255 Ischemic cardiomyopathy: Secondary | ICD-10-CM | POA: Diagnosis not present

## 2014-09-06 NOTE — Progress Notes (Signed)
Remote ICD transmission.   

## 2014-09-11 ENCOUNTER — Ambulatory Visit (HOSPITAL_COMMUNITY): Payer: Medicare Other | Attending: Internal Medicine

## 2014-09-11 ENCOUNTER — Other Ambulatory Visit: Payer: Self-pay

## 2014-09-11 DIAGNOSIS — R634 Abnormal weight loss: Secondary | ICD-10-CM

## 2014-09-11 DIAGNOSIS — I5022 Chronic systolic (congestive) heart failure: Secondary | ICD-10-CM | POA: Diagnosis not present

## 2014-09-11 DIAGNOSIS — I255 Ischemic cardiomyopathy: Secondary | ICD-10-CM | POA: Diagnosis not present

## 2014-09-11 DIAGNOSIS — I341 Nonrheumatic mitral (valve) prolapse: Secondary | ICD-10-CM | POA: Diagnosis not present

## 2014-09-11 DIAGNOSIS — N400 Enlarged prostate without lower urinary tract symptoms: Secondary | ICD-10-CM

## 2014-09-11 DIAGNOSIS — R6251 Failure to thrive (child): Secondary | ICD-10-CM

## 2014-09-11 DIAGNOSIS — I340 Nonrheumatic mitral (valve) insufficiency: Secondary | ICD-10-CM | POA: Insufficient documentation

## 2014-09-11 DIAGNOSIS — R627 Adult failure to thrive: Secondary | ICD-10-CM

## 2014-09-11 DIAGNOSIS — I071 Rheumatic tricuspid insufficiency: Secondary | ICD-10-CM | POA: Diagnosis not present

## 2014-09-11 DIAGNOSIS — I351 Nonrheumatic aortic (valve) insufficiency: Secondary | ICD-10-CM | POA: Diagnosis not present

## 2014-09-11 LAB — CUP PACEART REMOTE DEVICE CHECK
Battery Voltage: 2.95 V
Brady Statistic AP VP Percent: 0.01 %
Brady Statistic AS VP Percent: 0.01 %
Brady Statistic RV Percent Paced: 0.01 %
HIGH POWER IMPEDANCE MEASURED VALUE: 48 Ohm
HighPow Impedance: 38 Ohm
Lead Channel Impedance Value: 380 Ohm
Lead Channel Impedance Value: 437 Ohm
Lead Channel Sensing Intrinsic Amplitude: 1.875 mV
Lead Channel Sensing Intrinsic Amplitude: 1.875 mV
Lead Channel Sensing Intrinsic Amplitude: 10.375 mV
Lead Channel Sensing Intrinsic Amplitude: 10.375 mV
Lead Channel Setting Pacing Amplitude: 2 V
Lead Channel Setting Pacing Pulse Width: 0.4 ms
MDC IDC SESS DTM: 20160706084227
MDC IDC SET LEADCHNL RV PACING AMPLITUDE: 2.5 V
MDC IDC SET LEADCHNL RV SENSING SENSITIVITY: 0.3 mV
MDC IDC SET ZONE DETECTION INTERVAL: 320 ms
MDC IDC STAT BRADY AP VS PERCENT: 62.77 %
MDC IDC STAT BRADY AS VS PERCENT: 37.22 %
MDC IDC STAT BRADY RA PERCENT PACED: 62.77 %
Zone Setting Detection Interval: 270 ms
Zone Setting Detection Interval: 350 ms
Zone Setting Detection Interval: 400 ms

## 2014-09-13 ENCOUNTER — Encounter: Payer: Self-pay | Admitting: Internal Medicine

## 2014-09-27 ENCOUNTER — Ambulatory Visit (INDEPENDENT_AMBULATORY_CARE_PROVIDER_SITE_OTHER): Payer: Medicare Other | Admitting: Internal Medicine

## 2014-09-27 VITALS — BP 98/64 | HR 60 | Temp 98.0°F | Resp 12 | Ht 69.5 in | Wt 139.0 lb

## 2014-09-27 DIAGNOSIS — J449 Chronic obstructive pulmonary disease, unspecified: Secondary | ICD-10-CM

## 2014-09-27 DIAGNOSIS — E559 Vitamin D deficiency, unspecified: Secondary | ICD-10-CM

## 2014-09-27 DIAGNOSIS — Z681 Body mass index (BMI) 19 or less, adult: Secondary | ICD-10-CM

## 2014-09-27 DIAGNOSIS — E782 Mixed hyperlipidemia: Secondary | ICD-10-CM | POA: Diagnosis not present

## 2014-09-27 DIAGNOSIS — G894 Chronic pain syndrome: Secondary | ICD-10-CM

## 2014-09-27 DIAGNOSIS — K219 Gastro-esophageal reflux disease without esophagitis: Secondary | ICD-10-CM

## 2014-09-27 DIAGNOSIS — I255 Ischemic cardiomyopathy: Secondary | ICD-10-CM

## 2014-09-27 DIAGNOSIS — I10 Essential (primary) hypertension: Secondary | ICD-10-CM

## 2014-09-27 DIAGNOSIS — N3281 Overactive bladder: Secondary | ICD-10-CM | POA: Insufficient documentation

## 2014-09-27 DIAGNOSIS — R7303 Prediabetes: Secondary | ICD-10-CM

## 2014-09-27 DIAGNOSIS — Z125 Encounter for screening for malignant neoplasm of prostate: Secondary | ICD-10-CM

## 2014-09-27 DIAGNOSIS — Z1331 Encounter for screening for depression: Secondary | ICD-10-CM

## 2014-09-27 DIAGNOSIS — R972 Elevated prostate specific antigen [PSA]: Secondary | ICD-10-CM | POA: Diagnosis not present

## 2014-09-27 DIAGNOSIS — R7309 Other abnormal glucose: Secondary | ICD-10-CM | POA: Diagnosis not present

## 2014-09-27 DIAGNOSIS — Z95810 Presence of automatic (implantable) cardiac defibrillator: Secondary | ICD-10-CM

## 2014-09-27 DIAGNOSIS — Z79899 Other long term (current) drug therapy: Secondary | ICD-10-CM | POA: Diagnosis not present

## 2014-09-27 DIAGNOSIS — L280 Lichen simplex chronicus: Secondary | ICD-10-CM

## 2014-09-27 DIAGNOSIS — Z9181 History of falling: Secondary | ICD-10-CM

## 2014-09-27 DIAGNOSIS — Z1212 Encounter for screening for malignant neoplasm of rectum: Secondary | ICD-10-CM

## 2014-09-27 DIAGNOSIS — I5022 Chronic systolic (congestive) heart failure: Secondary | ICD-10-CM

## 2014-09-27 LAB — CBC WITH DIFFERENTIAL/PLATELET
BASOS PCT: 1 % (ref 0–1)
Basophils Absolute: 0.1 10*3/uL (ref 0.0–0.1)
Eosinophils Absolute: 0.1 10*3/uL (ref 0.0–0.7)
Eosinophils Relative: 1 % (ref 0–5)
HCT: 35 % — ABNORMAL LOW (ref 39.0–52.0)
HEMOGLOBIN: 11.9 g/dL — AB (ref 13.0–17.0)
LYMPHS ABS: 1.1 10*3/uL (ref 0.7–4.0)
LYMPHS PCT: 20 % (ref 12–46)
MCH: 31.7 pg (ref 26.0–34.0)
MCHC: 34 g/dL (ref 30.0–36.0)
MCV: 93.3 fL (ref 78.0–100.0)
MONO ABS: 0.5 10*3/uL (ref 0.1–1.0)
MPV: 10.3 fL (ref 8.6–12.4)
Monocytes Relative: 9 % (ref 3–12)
NEUTROS PCT: 69 % (ref 43–77)
Neutro Abs: 3.9 10*3/uL (ref 1.7–7.7)
Platelets: 214 10*3/uL (ref 150–400)
RBC: 3.75 MIL/uL — ABNORMAL LOW (ref 4.22–5.81)
RDW: 13.4 % (ref 11.5–15.5)
WBC: 5.7 10*3/uL (ref 4.0–10.5)

## 2014-09-27 MED ORDER — TRAMADOL HCL 50 MG PO TABS: ORAL_TABLET | ORAL | Status: AC

## 2014-09-27 MED ORDER — PREDNISONE 10 MG (21) PO TBPK: ORAL_TABLET | ORAL | Status: DC
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
## 2014-09-27 NOTE — Patient Instructions (Signed)

## 2014-09-28 ENCOUNTER — Encounter: Payer: Self-pay | Admitting: Internal Medicine

## 2014-09-28 LAB — MAGNESIUM: MAGNESIUM: 2 mg/dL (ref 1.5–2.5)

## 2014-09-28 LAB — BASIC METABOLIC PANEL WITH GFR
BUN: 25 mg/dL (ref 7–25)
CO2: 29 mEq/L (ref 20–31)
Calcium: 9.3 mg/dL (ref 8.6–10.3)
Chloride: 100 mEq/L (ref 98–110)
Creat: 0.86 mg/dL (ref 0.70–1.11)
GFR, Est African American: >89 mL/min (ref >=60)
GFR, Est Non African American: 80 mL/min (ref >=60)
Glucose, Bld: 97 mg/dL (ref 65–99)
Potassium: 4.5 mEq/L (ref 3.5–5.3)
Sodium: 138 mEq/L (ref 135–146)

## 2014-09-28 LAB — URINALYSIS, MICROSCOPIC ONLY
BACTERIA UA: NONE SEEN [HPF]
Casts: NONE SEEN [LPF]
Crystals: NONE SEEN [HPF]
Squamous Epithelial / LPF: NONE SEEN [HPF] (ref <=5)
WBC, UA: NONE SEEN WBC/HPF (ref <=5)
YEAST: NONE SEEN [HPF]

## 2014-09-28 LAB — LIPID PANEL
Cholesterol: 155 mg/dL (ref 125–200)
HDL: 71 mg/dL (ref >=40)
LDL Cholesterol: 68 mg/dL (ref <130)
Total CHOL/HDL Ratio: 2.2 Ratio (ref <=5.0)
Triglycerides: 80 mg/dL (ref <150)
VLDL: 16 mg/dL (ref <30)

## 2014-09-28 LAB — VITAMIN D 25 HYDROXY (VIT D DEFICIENCY, FRACTURES): Vit D, 25-Hydroxy: 82 ng/mL (ref 30–100)

## 2014-09-28 LAB — INSULIN, RANDOM: Insulin: 3.7 u[IU]/mL (ref 2.0–19.6)

## 2014-09-28 LAB — HEPATIC FUNCTION PANEL
ALK PHOS: 76 U/L (ref 40–115)
ALT: 14 U/L (ref 9–46)
AST: 18 U/L (ref 10–35)
Albumin: 4.2 g/dL (ref 3.6–5.1)
Bilirubin, Direct: 0.1 mg/dL (ref <=0.2)
Indirect Bilirubin: 0.5 mg/dL (ref 0.2–1.2)
Total Bilirubin: 0.6 mg/dL (ref 0.2–1.2)
Total Protein: 6.6 g/dL (ref 6.1–8.1)

## 2014-09-28 LAB — MICROALBUMIN / CREATININE URINE RATIO
Creatinine, Urine: 63.5 mg/dL
Microalb Creat Ratio: 4.7 mg/g (ref 0.0–30.0)
Microalb, Ur: 0.3 mg/dL (ref <2.0)

## 2014-09-28 LAB — HEMOGLOBIN A1C
HEMOGLOBIN A1C: 5.8 % — AB (ref <5.7)
MEAN PLASMA GLUCOSE: 120 mg/dL — AB (ref <117)

## 2014-09-28 LAB — PSA: PSA: <0.01 ng/mL (ref <=4.00)

## 2014-09-28 LAB — TSH: TSH: 1.457 u[IU]/mL (ref 0.350–4.500)

## 2014-10-06 ENCOUNTER — Encounter: Payer: Self-pay | Admitting: Cardiology

## 2014-10-12 ENCOUNTER — Other Ambulatory Visit: Payer: Self-pay | Admitting: *Deleted

## 2014-10-12 MED ORDER — ALPRAZOLAM 1 MG PO TABS: ORAL_TABLET | ORAL | Status: DC

## 2014-10-13 ENCOUNTER — Encounter: Payer: Self-pay | Admitting: Internal Medicine

## 2014-10-24 ENCOUNTER — Other Ambulatory Visit: Payer: Self-pay | Admitting: Internal Medicine

## 2014-10-24 ENCOUNTER — Other Ambulatory Visit: Payer: Self-pay | Admitting: *Deleted

## 2014-10-24 DIAGNOSIS — Z1212 Encounter for screening for malignant neoplasm of rectum: Secondary | ICD-10-CM

## 2014-10-24 LAB — POC HEMOCCULT BLD/STL (HOME/3-CARD/SCREEN)
Card #2 Fecal Occult Blod, POC: NEGATIVE
Card #3 Fecal Occult Blood, POC: NEGATIVE
FECAL OCCULT BLD: NEGATIVE

## 2014-11-02 ENCOUNTER — Ambulatory Visit (INDEPENDENT_AMBULATORY_CARE_PROVIDER_SITE_OTHER): Payer: Medicare Other | Admitting: Internal Medicine

## 2014-11-02 ENCOUNTER — Encounter: Payer: Self-pay | Admitting: Internal Medicine

## 2014-11-02 VITALS — BP 96/44 | HR 72 | Temp 97.3°F | Resp 16 | Ht 69.5 in | Wt 139.6 lb

## 2014-11-02 DIAGNOSIS — L280 Lichen simplex chronicus: Secondary | ICD-10-CM

## 2014-11-02 DIAGNOSIS — I255 Ischemic cardiomyopathy: Secondary | ICD-10-CM

## 2014-11-02 MED ORDER — HALOBETASOL PROPIONATE 0.05 % EX CREA: TOPICAL_CREAM | Freq: Two times a day (BID) | CUTANEOUS | Status: DC

## 2014-11-02 MED ORDER — PREDNISONE 10 MG PO TABS: ORAL_TABLET | ORAL | Status: DC

## 2014-11-02 MED ORDER — PREDNISONE 10 MG PO TABS: ORAL_TABLET | ORAL | Status: AC

## 2014-11-02 NOTE — Patient Instructions (Signed)
Start Prednisone 10 mg   at 1 & 1/2 tablets (=15 mg)   Every other day - even days of month  (ie. 2, 4, 6, 8, 10, 12, 14 ........Marland Kitchen  etc)

## 2014-11-02 NOTE — Progress Notes (Signed)
Subjective:    Patient ID: Richard Moody, male    DOB: 08/05/1927, 81 y.o.   MRN: [id number]  HPI Patient returns for 1 month to f/u treatment of a long standing eczema or neurodermatitis of the upper back & shoulders for which he has been under the care of a couple of dermatologists for many years and used a variety of high potency steroids creams. Over the last month he had a 3 step taper from 30 to 20 to 10 mg dosing at 10 day intervals and he relates the rash has all but resolve with only a few tiny patches. Also states the itching has resolved and he is now able to sleep at night  W/o awakening with severe itching.   Medication Sig  . ALPRAZolam (XANAX) 1 MG tablet 1/2 to 1 tablet 3 x daily as needed for anxiety or sleep  . aspirin 81 MG tablet Take 81 mg by mouth 3 (three) times a week.    . Cholecalciferol (VITAMIN D3) 5000 UNITS CAPS Take 2 capsules by mouth daily.    Marland Kitchen darifenacin (ENABLEX) 7.5 MG 24 hr tablet TAKE 1 TABLET EVERY DAY FORBLADDER  . gALLEGRA-D 60-120 MG per tablet Take 1 tablet by mouth daily.   . fish oil-omega-3 fatty acids 1000 MG capsule Take 2 g by mouth daily.    . fludrocortisone (FLORINEF) 0.1 MG tablet TAKE ONE TABLET BY MOUTH TWICE DAILY  . furosemide (LASIX) 40 MG tablet TAKE 1 TABLET ONCE A DAY  . glucosamine-chondroitin 500-400 MG tablet Take 1 tablet by mouth 2 (two) times daily.    . IRON PO Take 45 mg by mouth daily.   . Magnesium 250 MG TABS Take by mouth 2 (two) times daily.  . metoprolol succinate (TOPROL-XL) 25 MG 24 hr tablet Take 1 tablet (25 mg total) by mouth daily.  . Multiple Minerals-Vitamins (CITRACAL PLUS PO) Take 1 capsule by mouth daily.   . NON FORMULARY Halo beta a sol cream .05% 2-4- x per week as needed   . OVER THE COUNTER MEDICATION claritin 10 mg daily PRN  . polyethylene glycol (MIRALAX / GLYCOLAX) packet Take 17 g by mouth as needed for moderate constipation.   . quinapril (ACCUPRIL) 20 MG tablet TAKE 1/2 TABLET DAILY FOR   BLOOD PRESSURE AND HEART  . Red Yeast Rice Extract (RED YEAST RICE PO) Take by mouth daily. 2 DAILY   . traMADol (ULTRAM) 50 MG tablet Take 1 tablet 3 x day with meals for pain  . triamcinolone cream (KENALOG) 0.1 % Apply 1 application topically as needed (for itching).   . vitamin C (ASCORBIC ACID) 500 MG tablet Take 1,000 mg by mouth daily.    . predniSONE (STERAPRED UNI-PAK 21 TAB) 10 MG (21)  Take 1 tablet 3 x day for 10 days then 2 x day for 10 days then 1 x day or as directed   Allergies  Allergen Reactions  . Lipitor [Atorvastatin]     Elevated cpk   . Mobic [Meloxicam] Other (See Comments)    edema  . Nsaids Other (See Comments)    dyspepsia   Past Medical History  Diagnosis Date  . Ischemic cardiomyopathy     EF 32%  . OA (osteoarthritis) of knee   . Ganglion cyst of wrist     LEFT WRIST  . MI, old     INFERIOR LATERAL  . Hyperlipidemia   . GERD (gastroesophageal reflux disease)   . ICD (implantable cardiac  defibrillator) in place August 2010  . Hx of CABG 1992  . S/P cardiac cath 2006    Occluded SVG to OM. Managed medically  . Normal nuclear stress test June 2010    No ischemia. EF 32%  . Vitamin D deficiency   . Pre-diabetes    Past Surgical History  Procedure Laterality Date  . Cardiac catheterization  10/14/2004    THERE IS POSTERIOR LATERAL HYPOKINESIA. EF 30-35%  . Coronary artery bypass graft  1992  . Knee surgery      RIGHT KNEE  . Cardiac defibrillator placement  10/2008  . Inguinal hernia repair    . Cardiac catheterization  2006    OCCLUDED SVG TO OM  . Nuclear stress test  2010    EF 32%. No ischemia   Review of Systems    10 point systems review negative except as above.     Objective:   Physical Exam  BP 96/44 mmHg  Pulse 72  Temp(Src) 97.3 F (36.3 C)  Resp 16  Ht 5' 9.5" (1.765 m)  Wt 139 lb 9.6 oz (63.322 kg)  BMI 20.33 kg/m2  HEENT - Eac's patent. TM's Nl. EOM's full. PERRLA. NasoOroPharynx clear. Neck - supple. Nl Thyroid.  Carotids 2+ & No bruits, nodes, JVD Chest - Clear equal BS w/o Rales, rhonchi, wheezes. Cor - Nl HS. RRR w/o sig MGR. PP 1(+). No edema. Abd - No palpable organomegaly, masses or tenderness. BS nl. MS- FROM w/o deformities. Muscle power, tone and bulk Nl. Gait Nl. Neuro - No obvious Cr N abnormalities. Sensory, motor and Cerebellar functions appear Nl w/o focal abnormalities. Psyche - Mental status normal & appropriate.  No delusions, ideations or obvious mood abnormalities.  Skin - previously noted rash of upper posterior back appears resolved.    Assessment & Plan:   1. Neurodermatitis  - halobetasol (ULTRAVATE) 0.05 % cream; Apply topically 2 (two) times daily.  Dispense: 150 g; Refill: 1  - predniSONE (DELTASONE) 10 MG tablet; Take 1 tablet 3 x day or as directed  Dispense: 100 tablet; Refill: 0  - discussed with patient to continue prednisone 10 mg x 1 &1/2 tab = 15 mg qod  - discussed meds & SE's

## 2014-11-07 ENCOUNTER — Other Ambulatory Visit: Payer: Self-pay | Admitting: *Deleted

## 2014-11-07 DIAGNOSIS — L280 Lichen simplex chronicus: Secondary | ICD-10-CM

## 2014-11-07 MED ORDER — HALOBETASOL PROPIONATE 0.05 % EX CREA: TOPICAL_CREAM | Freq: Two times a day (BID) | CUTANEOUS | Status: DC

## 2014-12-11 ENCOUNTER — Ambulatory Visit (INDEPENDENT_AMBULATORY_CARE_PROVIDER_SITE_OTHER): Payer: Medicare Other | Admitting: *Deleted

## 2014-12-11 DIAGNOSIS — I255 Ischemic cardiomyopathy: Secondary | ICD-10-CM

## 2014-12-11 DIAGNOSIS — I5022 Chronic systolic (congestive) heart failure: Secondary | ICD-10-CM

## 2014-12-11 NOTE — Progress Notes (Signed)
Remote ICD transmission.   

## 2014-12-14 LAB — CUP PACEART REMOTE DEVICE CHECK
Brady Statistic AP VS Percent: 53.49 %
Brady Statistic AS VS Percent: 46.49 %
Brady Statistic RA Percent Paced: 53.49 %
Date Time Interrogation Session: 20161010052307
HighPow Impedance: 40 Ohm
HighPow Impedance: 53 Ohm
Implantable Lead Location: 753859
Implantable Lead Location: 753860
Lead Channel Sensing Intrinsic Amplitude: 9.5 mV
Lead Channel Sensing Intrinsic Amplitude: 9.5 mV
Lead Channel Setting Pacing Amplitude: 2 V
Lead Channel Setting Pacing Amplitude: 2.5 V
Lead Channel Setting Pacing Pulse Width: 0.4 ms
Lead Channel Setting Sensing Sensitivity: 0.3 mV
MDC IDC LEAD IMPLANT DT: 20100805
MDC IDC LEAD IMPLANT DT: 20100805
MDC IDC MSMT BATTERY VOLTAGE: 2.89 V
MDC IDC MSMT LEADCHNL RA IMPEDANCE VALUE: 475 Ohm
MDC IDC MSMT LEADCHNL RA SENSING INTR AMPL: 2.25 mV
MDC IDC MSMT LEADCHNL RA SENSING INTR AMPL: 2.25 mV
MDC IDC MSMT LEADCHNL RV IMPEDANCE VALUE: 418 Ohm
MDC IDC SET ZONE DETECTION INTERVAL: 320 ms
MDC IDC STAT BRADY AP VP PERCENT: 0.01 %
MDC IDC STAT BRADY AS VP PERCENT: 0.01 %
MDC IDC STAT BRADY RV PERCENT PACED: 0.02 %
Zone Setting Detection Interval: 270 ms
Zone Setting Detection Interval: 350 ms
Zone Setting Detection Interval: 400 ms

## 2014-12-18 ENCOUNTER — Other Ambulatory Visit: Payer: Self-pay | Admitting: *Deleted

## 2014-12-18 MED ORDER — ESOMEPRAZOLE MAGNESIUM 40 MG PO CPDR: 40.0000 mg | DELAYED_RELEASE_CAPSULE | Freq: Every day | ORAL | Status: DC

## 2015-01-04 ENCOUNTER — Encounter: Payer: Self-pay | Admitting: Internal Medicine

## 2015-01-04 ENCOUNTER — Ambulatory Visit (INDEPENDENT_AMBULATORY_CARE_PROVIDER_SITE_OTHER): Payer: Medicare Other | Admitting: Internal Medicine

## 2015-01-04 VITALS — BP 114/54 | HR 72 | Temp 98.0°F | Resp 16 | Ht 69.0 in | Wt 141.0 lb

## 2015-01-04 DIAGNOSIS — I509 Heart failure, unspecified: Secondary | ICD-10-CM | POA: Diagnosis not present

## 2015-01-04 DIAGNOSIS — Z23 Encounter for immunization: Secondary | ICD-10-CM | POA: Diagnosis not present

## 2015-01-04 DIAGNOSIS — I5022 Chronic systolic (congestive) heart failure: Secondary | ICD-10-CM | POA: Diagnosis not present

## 2015-01-04 DIAGNOSIS — J449 Chronic obstructive pulmonary disease, unspecified: Secondary | ICD-10-CM | POA: Diagnosis not present

## 2015-01-04 DIAGNOSIS — R7309 Other abnormal glucose: Secondary | ICD-10-CM | POA: Diagnosis not present

## 2015-01-04 DIAGNOSIS — R7303 Prediabetes: Secondary | ICD-10-CM

## 2015-01-04 DIAGNOSIS — E782 Mixed hyperlipidemia: Secondary | ICD-10-CM

## 2015-01-04 DIAGNOSIS — Z79899 Other long term (current) drug therapy: Secondary | ICD-10-CM

## 2015-01-04 LAB — CBC WITH DIFFERENTIAL/PLATELET
BASOS ABS: 0.1 10*3/uL (ref 0.0–0.1)
BASOS PCT: 1 % (ref 0–1)
EOS ABS: 0.3 10*3/uL (ref 0.0–0.7)
Eosinophils Relative: 5 % (ref 0–5)
HCT: 32.9 % — ABNORMAL LOW (ref 39.0–52.0)
Hemoglobin: 11 g/dL — ABNORMAL LOW (ref 13.0–17.0)
LYMPHS ABS: 1.1 10*3/uL (ref 0.7–4.0)
Lymphocytes Relative: 18 % (ref 12–46)
MCH: 31.7 pg (ref 26.0–34.0)
MCHC: 33.4 g/dL (ref 30.0–36.0)
MCV: 94.8 fL (ref 78.0–100.0)
MPV: 10.5 fL (ref 8.6–12.4)
Monocytes Absolute: 0.8 10*3/uL (ref 0.1–1.0)
Monocytes Relative: 13 % — ABNORMAL HIGH (ref 3–12)
NEUTROS PCT: 63 % (ref 43–77)
Neutro Abs: 3.7 10*3/uL (ref 1.7–7.7)
PLATELETS: 227 10*3/uL (ref 150–400)
RBC: 3.47 MIL/uL — ABNORMAL LOW (ref 4.22–5.81)
RDW: 13.6 % (ref 11.5–15.5)
WBC: 5.9 10*3/uL (ref 4.0–10.5)

## 2015-01-04 LAB — TSH: TSH: 1.446 u[IU]/mL (ref 0.350–4.500)

## 2015-01-04 LAB — BASIC METABOLIC PANEL WITH GFR
BUN: 23 mg/dL (ref 7–25)
CHLORIDE: 102 mmol/L (ref 98–110)
CO2: 31 mmol/L (ref 20–31)
CREATININE: 0.85 mg/dL (ref 0.70–1.11)
Calcium: 9.1 mg/dL (ref 8.6–10.3)
GFR, Est African American: >89 mL/min (ref >=60)
GFR, Est Non African American: 79 mL/min (ref >=60)
GLUCOSE: 99 mg/dL (ref 65–99)
POTASSIUM: 4.7 mmol/L (ref 3.5–5.3)
SODIUM: 140 mmol/L (ref 135–146)

## 2015-01-04 LAB — LIPID PANEL
CHOL/HDL RATIO: 2.5 ratio (ref <=5.0)
Cholesterol: 160 mg/dL (ref 125–200)
HDL: 63 mg/dL (ref >=40)
LDL Cholesterol: 76 mg/dL (ref <130)
Triglycerides: 104 mg/dL (ref <150)
VLDL: 21 mg/dL (ref <30)

## 2015-01-04 LAB — HEPATIC FUNCTION PANEL
ALBUMIN: 3.6 g/dL (ref 3.6–5.1)
ALK PHOS: 69 U/L (ref 40–115)
ALT: 14 U/L (ref 9–46)
AST: 18 U/L (ref 10–35)
BILIRUBIN TOTAL: 0.6 mg/dL (ref 0.2–1.2)
Bilirubin, Direct: 0.1 mg/dL (ref <=0.2)
Indirect Bilirubin: 0.5 mg/dL (ref 0.2–1.2)
Total Protein: 5.9 g/dL — ABNORMAL LOW (ref 6.1–8.1)

## 2015-01-04 MED ORDER — QUINAPRIL HCL 10 MG PO TABS: 10.0000 mg | ORAL_TABLET | Freq: Every day | ORAL | Status: DC

## 2015-01-04 MED ORDER — POTASSIUM CHLORIDE CRYS ER 20 MEQ PO TBCR: 20.0000 meq | EXTENDED_RELEASE_TABLET | Freq: Every day | ORAL | Status: DC

## 2015-01-04 MED ORDER — DICLOFENAC SODIUM 1 % TD GEL: 2.0000 g | Freq: Four times a day (QID) | TRANSDERMAL | Status: DC

## 2015-01-04 NOTE — Progress Notes (Signed)
Patient ID: Richard Moody, male   DOB: 1927/04/10, 81 y.o.   MRN: [id number]  Assessment and Plan:  Hypertension:  -drop quinapril to 10 mg daily -add in potassium if taking lasix twice daily -take lasix twice daily 2-3 days then back to once daily -Continue medication,  -monitor blood pressure at home.  -Continue DASH diet.   -Reminder to go to the ER if any CP, SOB, nausea, dizziness, severe HA, changes vision/speech, left arm numbness and tingling, and jaw pain.  Cholesterol: -Continue diet and exercise.  -Check cholesterol.   Pre-diabetes: -Continue diet and exercise.  -Check A1C  Vitamin D Def: -check level -continue medications.   Continue diet and meds as discussed. Further disposition pending results of labs.  HPI 81 y.o. male  presents for 3 month follow up with hypertension, hyperlipidemia, prediabetes and vitamin D.   His blood pressure has been controlled at home, today their BP is BP: (!) 114/54 mmHg.   He does not workout. He denies chest pain, shortness of breath, dizziness.  His wife notes that his cardiologist asked him to cut quinapril in half but he still does his own medications and she was unaware he had not changed this.     He is on cholesterol medication and denies myalgias. His cholesterol is at goal. The cholesterol last visit was:   Lab Results  Component Value Date   CHOL 155 09/27/2014   HDL 71 09/27/2014   LDLCALC 68 09/27/2014   TRIG 80 09/27/2014   CHOLHDL 2.2 09/27/2014     He has been working on diet and exercise for prediabetes, and denies foot ulcerations, hyperglycemia, hypoglycemia , increased appetite, nausea, paresthesia of the feet, polydipsia, polyuria, visual disturbances, vomiting and weight loss. Last A1C in the office was:  Lab Results  Component Value Date   HGBA1C 5.8* 09/27/2014    Patient is on Vitamin D supplement.  Lab Results  Component Value Date   VD25OH 82 09/27/2014      Current Medications:  Current  Outpatient Prescriptions on File Prior to Visit  Medication Sig Dispense Refill  . ALPRAZolam (XANAX) 1 MG tablet 1/2 to 1 tablet 3 x daily as needed for anxiety or sleep 270 tablet 1  . aspirin 81 MG tablet Take 81 mg by mouth 3 (three) times a week.      . Cholecalciferol (VITAMIN D3) 5000 UNITS CAPS Take 2 capsules by mouth daily.      Marland Kitchen darifenacin (ENABLEX) 7.5 MG 24 hr tablet TAKE 1 TABLET EVERY DAY FORBLADDER 90 tablet 99  . esomeprazole (NEXIUM) 40 MG capsule Take 1 capsule (40 mg total) by mouth daily at 12 noon. 90 capsule 1  . fexofenadine-pseudoephedrine (ALLEGRA-D) 60-120 MG per tablet Take 1 tablet by mouth daily.     . fish oil-omega-3 fatty acids 1000 MG capsule Take 2 g by mouth daily.      . fludrocortisone (FLORINEF) 0.1 MG tablet TAKE ONE TABLET BY MOUTH TWICE DAILY 180 tablet 1  . furosemide (LASIX) 40 MG tablet TAKE 1 TABLET ONCE A DAY 180 tablet 1  . glucosamine-chondroitin 500-400 MG tablet Take 1 tablet by mouth 2 (two) times daily.      . halobetasol (ULTRAVATE) 0.05 % cream Apply topically 2 (two) times daily. 50 g 3  . IRON PO Take 45 mg by mouth daily.     . Magnesium 250 MG TABS Take by mouth 2 (two) times daily.    . metoprolol succinate (TOPROL-XL) 25 MG  24 hr tablet Take 1 tablet (25 mg total) by mouth daily. 90 tablet 3  . Multiple Minerals-Vitamins (CITRACAL PLUS PO) Take 1 capsule by mouth daily.     . NON FORMULARY Halo beta a sol cream .05% 2-4- x per week as needed     . OVER THE COUNTER MEDICATION claritin 10 mg daily PRN    . polyethylene glycol (MIRALAX / GLYCOLAX) packet Take 17 g by mouth as needed for moderate constipation.     . quinapril (ACCUPRIL) 20 MG tablet TAKE 1/2 TABLET DAILY FOR  BLOOD PRESSURE AND HEART 90 tablet 3  . Red Yeast Rice Extract (RED YEAST RICE PO) Take by mouth daily. 2 DAILY     . triamcinolone cream (KENALOG) 0.1 % Apply 1 application topically as needed (for itching).     . vitamin C (ASCORBIC ACID) 500 MG tablet Take  1,000 mg by mouth daily.       No current facility-administered medications on file prior to visit.    Medical History:  Past Medical History  Diagnosis Date  . Ischemic cardiomyopathy     EF 32%  . OA (osteoarthritis) of knee   . Ganglion cyst of wrist     LEFT WRIST  . MI, old     INFERIOR LATERAL  . Hyperlipidemia   . GERD (gastroesophageal reflux disease)   . ICD (implantable cardiac defibrillator) in place August 2010  . Hx of CABG 1992  . S/P cardiac cath 2006    Occluded SVG to OM. Managed medically  . Normal nuclear stress test June 2010    No ischemia. EF 32%  . Vitamin D deficiency   . Pre-diabetes     Allergies:  Allergies  Allergen Reactions  . Lipitor [Atorvastatin]     Elevated cpk   . Mobic [Meloxicam] Other (See Comments)    edema  . Nsaids Other (See Comments)    dyspepsia     Review of Systems:  Review of Systems  Constitutional: Positive for malaise/fatigue. Negative for fever and chills.  HENT: Negative for congestion, ear pain and sore throat.   Respiratory: Negative for cough, shortness of breath and wheezing.   Cardiovascular: Positive for leg swelling. Negative for chest pain and palpitations.  Gastrointestinal: Positive for constipation. Negative for heartburn, diarrhea, blood in stool and melena.  Genitourinary: Negative.   Neurological: Negative for dizziness, sensory change, loss of consciousness and headaches.  Psychiatric/Behavioral: Negative for depression. The patient is not nervous/anxious and does not have insomnia.     Family history- Review and unchanged  Social history- Review and unchanged  Physical Exam: BP 114/54 mmHg  Pulse 72  Temp(Src) 98 F (36.7 C) (Temporal)  Resp 16  Ht 5\' 9"  (1.753 m)  Wt 141 lb (63.957 kg)  BMI 20.81 kg/m2 Wt Readings from Last 3 Encounters:  01/04/15 141 lb (63.957 kg)  11/02/14 139 lb 9.6 oz (63.322 kg)  09/28/14 139 lb (63.05 kg)    General Appearance: Well nourished well  developed, in no apparent distress. Eyes: PERRLA, EOMs, conjunctiva no swelling or erythema ENT/Mouth: Ear canals normal without obstruction, swelling, erythma, discharge.  TMs normal bilaterally.  Oropharynx moist, clear, without exudate, or postoropharyngeal swelling. Neck: Supple, thyroid normal,no cervical adenopathy  Respiratory: Respiratory effort normal, Breath sounds clear A&P without rhonchi, wheeze, or rale.  No retractions, no accessory usage. Cardio: RRR with no MRGs. Brisk peripheral pulses without edema. 2+ edema pretibially bilaterally  Abdomen: Soft, + BS,  Non tender, no  guarding, rebound, hernias, masses. Musculoskeletal: Full ROM, 5/5 strength, Normal gait Skin: Warm, dry without rashes, lesions, ecchymosis.  Neuro: Awake and oriented X 3, Cranial nerves intact. Normal muscle tone, no cerebellar symptoms. Psych: Normal affect, Insight and Judgment appropriate.    Starlyn Skeans, PA-C 3:05 PM [facility]

## 2015-01-04 NOTE — Patient Instructions (Addendum)
Please make sure that you are taking 10 mg of the quinapril daily.  Please take 40 mg of lasix in the morning.  If you are having bad swelling you can take 40 mg of lasix before 5 in the afternoon.  I would  Do 3-4 days of twice daily lasix.    Please take 1 tablet of the potassium pill with your lasix.    You can use flonase nightly 2 sprays per nostril right before bedtime to help with congestion.  Keep taking the claritin.  Please use the voltaren gel up to twice daily as needed for your right knee pain.

## 2015-01-17 ENCOUNTER — Other Ambulatory Visit: Payer: Self-pay | Admitting: Internal Medicine

## 2015-01-17 DIAGNOSIS — I255 Ischemic cardiomyopathy: Secondary | ICD-10-CM

## 2015-01-17 DIAGNOSIS — N400 Enlarged prostate without lower urinary tract symptoms: Secondary | ICD-10-CM

## 2015-01-17 DIAGNOSIS — I5022 Chronic systolic (congestive) heart failure: Secondary | ICD-10-CM

## 2015-01-17 DIAGNOSIS — R634 Abnormal weight loss: Secondary | ICD-10-CM

## 2015-01-17 DIAGNOSIS — R6251 Failure to thrive (child): Secondary | ICD-10-CM

## 2015-01-17 MED ORDER — POTASSIUM CHLORIDE CRYS ER 20 MEQ PO TBCR: 20.0000 meq | EXTENDED_RELEASE_TABLET | Freq: Every day | ORAL | Status: DC

## 2015-01-17 MED ORDER — FUROSEMIDE 40 MG PO TABS: ORAL_TABLET | ORAL | Status: DC

## 2015-01-24 ENCOUNTER — Encounter: Payer: Self-pay | Admitting: *Deleted

## 2015-02-02 ENCOUNTER — Encounter: Payer: Self-pay | Admitting: Internal Medicine

## 2015-04-08 NOTE — Patient Instructions (Signed)
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
## 2015-04-09 ENCOUNTER — Encounter: Payer: Self-pay | Admitting: Internal Medicine

## 2015-04-09 ENCOUNTER — Ambulatory Visit (INDEPENDENT_AMBULATORY_CARE_PROVIDER_SITE_OTHER): Payer: Medicare Other | Admitting: Internal Medicine

## 2015-04-09 VITALS — BP 90/50 | HR 43 | Temp 97.7°F | Resp 14 | Ht 69.0 in | Wt 141.0 lb

## 2015-04-09 DIAGNOSIS — E782 Mixed hyperlipidemia: Secondary | ICD-10-CM | POA: Diagnosis not present

## 2015-04-09 DIAGNOSIS — K219 Gastro-esophageal reflux disease without esophagitis: Secondary | ICD-10-CM

## 2015-04-09 DIAGNOSIS — I10 Essential (primary) hypertension: Secondary | ICD-10-CM

## 2015-04-09 DIAGNOSIS — I255 Ischemic cardiomyopathy: Secondary | ICD-10-CM

## 2015-04-09 DIAGNOSIS — J4489 Other specified chronic obstructive pulmonary disease: Secondary | ICD-10-CM

## 2015-04-09 DIAGNOSIS — E559 Vitamin D deficiency, unspecified: Secondary | ICD-10-CM | POA: Diagnosis not present

## 2015-04-09 DIAGNOSIS — Z79899 Other long term (current) drug therapy: Secondary | ICD-10-CM

## 2015-04-09 DIAGNOSIS — R7303 Prediabetes: Secondary | ICD-10-CM

## 2015-04-09 DIAGNOSIS — J449 Chronic obstructive pulmonary disease, unspecified: Secondary | ICD-10-CM

## 2015-04-09 DIAGNOSIS — Z95810 Presence of automatic (implantable) cardiac defibrillator: Secondary | ICD-10-CM

## 2015-04-09 DIAGNOSIS — I5022 Chronic systolic (congestive) heart failure: Secondary | ICD-10-CM

## 2015-04-09 DIAGNOSIS — R7309 Other abnormal glucose: Secondary | ICD-10-CM

## 2015-04-09 LAB — CBC WITH DIFFERENTIAL/PLATELET
BASOS ABS: 0 10*3/uL (ref 0.0–0.1)
BASOS PCT: 0 % (ref 0–1)
Eosinophils Absolute: 0 10*3/uL (ref 0.0–0.7)
Eosinophils Relative: 0 % (ref 0–5)
HEMATOCRIT: 34 % — AB (ref 39.0–52.0)
HEMOGLOBIN: 11 g/dL — AB (ref 13.0–17.0)
Lymphocytes Relative: 8 % — ABNORMAL LOW (ref 12–46)
Lymphs Abs: 0.5 10*3/uL — ABNORMAL LOW (ref 0.7–4.0)
MCH: 30.4 pg (ref 26.0–34.0)
MCHC: 32.4 g/dL (ref 30.0–36.0)
MCV: 93.9 fL (ref 78.0–100.0)
MONOS PCT: 2 % — AB (ref 3–12)
MPV: 11 fL (ref 8.6–12.4)
Monocytes Absolute: 0.1 10*3/uL (ref 0.1–1.0)
NEUTROS ABS: 5.9 10*3/uL (ref 1.7–7.7)
NEUTROS PCT: 90 % — AB (ref 43–77)
Platelets: 249 10*3/uL (ref 150–400)
RBC: 3.62 MIL/uL — AB (ref 4.22–5.81)
RDW: 13.6 % (ref 11.5–15.5)
WBC: 6.6 10*3/uL (ref 4.0–10.5)

## 2015-04-09 LAB — BASIC METABOLIC PANEL WITH GFR
BUN: 24 mg/dL (ref 7–25)
CHLORIDE: 102 mmol/L (ref 98–110)
CO2: 28 mmol/L (ref 20–31)
CREATININE: 0.89 mg/dL (ref 0.70–1.11)
Calcium: 9.1 mg/dL (ref 8.6–10.3)
GFR, Est African American: >89 mL/min (ref >=60)
GFR, Est Non African American: 78 mL/min (ref >=60)
GLUCOSE: 124 mg/dL — AB (ref 65–99)
Potassium: 5.1 mmol/L (ref 3.5–5.3)
SODIUM: 139 mmol/L (ref 135–146)

## 2015-04-09 LAB — LIPID PANEL
CHOL/HDL RATIO: 2.8 ratio (ref <=5.0)
Cholesterol: 184 mg/dL (ref 125–200)
HDL: 66 mg/dL (ref >=40)
LDL Cholesterol: 103 mg/dL (ref <130)
Triglycerides: 75 mg/dL (ref <150)
VLDL: 15 mg/dL (ref <30)

## 2015-04-09 LAB — HEMOGLOBIN A1C
HEMOGLOBIN A1C: 6.1 % — AB (ref <5.7)
MEAN PLASMA GLUCOSE: 128 mg/dL — AB (ref <117)

## 2015-04-09 LAB — HEPATIC FUNCTION PANEL
ALT: 17 U/L (ref 9–46)
AST: 19 U/L (ref 10–35)
Albumin: 3.6 g/dL (ref 3.6–5.1)
Alkaline Phosphatase: 66 U/L (ref 40–115)
BILIRUBIN DIRECT: 0.1 mg/dL (ref <=0.2)
BILIRUBIN INDIRECT: 0.3 mg/dL (ref 0.2–1.2)
TOTAL PROTEIN: 6 g/dL — AB (ref 6.1–8.1)
Total Bilirubin: 0.4 mg/dL (ref 0.2–1.2)

## 2015-04-09 LAB — TSH: TSH: 1.08 m[IU]/L (ref 0.40–4.50)

## 2015-04-09 LAB — MAGNESIUM: MAGNESIUM: 2.1 mg/dL (ref 1.5–2.5)

## 2015-04-10 LAB — VITAMIN D 25 HYDROXY (VIT D DEFICIENCY, FRACTURES): Vit D, 25-Hydroxy: 71 ng/mL (ref 30–100)

## 2015-04-10 LAB — INSULIN, RANDOM: Insulin: 6.6 u[IU]/mL (ref 2.0–19.6)

## 2015-04-23 ENCOUNTER — Encounter: Payer: Self-pay | Admitting: Internal Medicine

## 2015-04-23 ENCOUNTER — Ambulatory Visit: Payer: Self-pay | Admitting: Internal Medicine

## 2015-04-23 ENCOUNTER — Ambulatory Visit (INDEPENDENT_AMBULATORY_CARE_PROVIDER_SITE_OTHER): Payer: Medicare Other | Admitting: Internal Medicine

## 2015-04-23 VITALS — BP 100/54 | HR 64 | Temp 97.6°F | Resp 16 | Ht 71.0 in | Wt 140.4 lb

## 2015-04-23 DIAGNOSIS — I872 Venous insufficiency (chronic) (peripheral): Secondary | ICD-10-CM

## 2015-04-23 DIAGNOSIS — I255 Ischemic cardiomyopathy: Secondary | ICD-10-CM

## 2015-04-23 NOTE — Progress Notes (Signed)
Subjective:    Patient ID: Richard Moody, male    DOB: 1927/10/02, 82 y.o.   MRN: [id number]  HPI  This very nice 82 yo MWM w/HTN, ASHD/chronicCHF, Chronic Venous Insufficiencyand chronic orthostatic Hypotension was recently seen 2 weeks ago and doing well is now brought in by his wife for concern re:"weeping" of his legs and swelling of his toes. Denies and orthopnea/PND or dyspnea otherwise.     Outpatient Prescriptions Prior to Visit  Medication Sig Dispense Refill  . ALPRAZolam (XANAX) 1 MG tablet 1/2 to 1 tablet 3 x daily as needed for anxiety or sleep 270 tablet 1  . aspirin 81 MG tablet Take 81 mg by mouth 3 (three) times a week.      . Cholecalciferol (VITAMIN D3) 5000 UNITS CAPS Take 2 capsules by mouth daily.      Marland Kitchen darifenacin (ENABLEX) 7.5 MG 24 hr tablet TAKE 1 TABLET EVERY DAY FORBLADDER 90 tablet 99  . diclofenac sodium (VOLTAREN) 1 % GEL Apply 2 g topically 4 (four) times daily. 100 g 0  . esomeprazole (NEXIUM) 40 MG capsule Take 1 capsule (40 mg total) by mouth daily at 12 noon. 90 capsule 1  . fexofenadine-pseudoephedrine (ALLEGRA-D) 60-120 MG per tablet Take 1 tablet by mouth daily.     . fish oil-omega-3 fatty acids 1000 MG capsule Take 2 g by mouth daily.      . fludrocortisone (FLORINEF) 0.1 MG tablet TAKE ONE TABLET BY MOUTH TWICE DAILY 180 tablet 1  . furosemide (LASIX) 40 MG tablet TAKE 1 TABLET ONCE A DAY 180 tablet 1  . glucosamine-chondroitin 500-400 MG tablet Take 1 tablet by mouth 2 (two) times daily.      . halobetasol (ULTRAVATE) 0.05 % cream Apply topically 2 (two) times daily. 50 g 3  . Halobetasol Cr & Lactic Ac Cr 0.05 & 10 % KIT     . IRON PO Take 45 mg by mouth daily.     . Magnesium 250 MG TABS Take by mouth 2 (two) times daily.    . metoprolol succinate (TOPROL-XL) 25 MG 24 hr tablet Take 1 tablet (25 mg total) by mouth daily. 90 tablet 3  . Multiple Minerals-Vitamins (CITRACAL PLUS PO) Take 1 capsule by mouth daily.     . NON FORMULARY Halo beta a  sol cream .05% 2-4- x per week as needed     . OVER THE COUNTER MEDICATION claritin 10 mg daily PRN    . polyethylene glycol (MIRALAX / GLYCOLAX) packet Take 17 g by mouth as needed for moderate constipation.     . potassium chloride SA (K-DUR,KLOR-CON) 20 MEQ tablet Take 1 tablet (20 mEq total) by mouth daily. 90 tablet 2  . quinapril (ACCUPRIL) 10 MG tablet Take 1 tablet (10 mg total) by mouth daily. 90 tablet 11  . quinapril (ACCUPRIL) 20 MG tablet     . Red Yeast Rice Extract (RED YEAST RICE PO) Take by mouth daily. 2 DAILY     . triamcinolone cream (KENALOG) 0.1 % Apply 1 application topically as needed (for itching).     . vitamin C (ASCORBIC ACID) 500 MG tablet Take 1,000 mg by mouth daily.       No facility-administered medications prior to visit.   Allergies  Allergen Reactions  . Lipitor [Atorvastatin]     Elevated cpk   . Mobic [Meloxicam] Other (See Comments)    edema  . Nsaids Other (See Comments)    dyspepsia   Past  Medical History  Diagnosis Date  . Ischemic cardiomyopathy     EF 32%  . OA (osteoarthritis) of knee   . Ganglion cyst of wrist     LEFT WRIST  . MI, old     INFERIOR LATERAL  . Hyperlipidemia   . GERD (gastroesophageal reflux disease)   . ICD (implantable cardiac defibrillator) in place August 2010  . Hx of CABG 1992  . S/P cardiac cath 2006    Occluded SVG to OM. Managed medically  . Normal nuclear stress test June 2010    No ischemia. EF 32%  . Vitamin D deficiency   . Pre-diabetes    Past Surgical History  Procedure Laterality Date  . Cardiac catheterization  10/14/2004    THERE IS POSTERIOR LATERAL HYPOKINESIA. EF 30-35%  . Coronary artery bypass graft  1992  . Knee surgery      RIGHT KNEE  . Cardiac defibrillator placement  10/2008  . Inguinal hernia repair    . Cardiac catheterization  2006    OCCLUDED SVG TO OM  . Nuclear stress test  2010    EF 32%. No ischemia   Review of Systems  10 point systems review negative except as  above.    Objective:   Physical Exam  BP 100/54 mmHg  Pulse 64  Temp(Src) 97.6 F (36.4 C)  Resp 16  Ht '5\' 11"'  (1.803 m)  Wt 140 lb 6.4 oz (63.685 kg)  BMI 19.59 kg/m2  Chronically ill appearing elderly male in no distress.   HEENT - Eac's patent. TM's Nl. EOM's full. PERRLA. NasoOroPharynx clear. Neck - supple. Nl Thyroid. Carotids 2+ & No bruits, nodes, JVD Chest - Clear equal BS w/o Rales, rhonchi, wheezes. Cor - Nl HS. RRR w/o sig MGR. PP 1(+). 2(+) bilat pretibial edema w/o signs of cellulitis, lymphangitis. Capillary refill is Normal at the toes.  Abd - No palpable organomegaly, masses or tenderness. BS nl. MS- FROM w/o deformities. Muscle power, tone and bulk Nl. Gait Nl. Neuro - No obvious Cr N abnormalities. Sensory, motor and Cerebellar functions appear Nl w/o focal abnormalities. Psyche - Mental status normal & appropriate.  No delusions, ideations or obvious mood abnormalities.    Assessment & Plan:   1. Chronic venous insufficiency  - Patient & wife not certain if taking his Florinef qd or bid.  - advised cut back florinef from bid to qd or if on qd to decrease to qd.   Over 20 minutes of exam, counseling, chart review and critical decision making was performed

## 2015-04-30 ENCOUNTER — Other Ambulatory Visit: Payer: Self-pay | Admitting: *Deleted

## 2015-04-30 DIAGNOSIS — L280 Lichen simplex chronicus: Secondary | ICD-10-CM

## 2015-04-30 MED ORDER — HALOBETASOL PROPIONATE 0.05 % EX CREA: TOPICAL_CREAM | Freq: Two times a day (BID) | CUTANEOUS | Status: DC

## 2015-05-04 ENCOUNTER — Telehealth: Payer: Self-pay | Admitting: *Deleted

## 2015-05-04 NOTE — Telephone Encounter (Signed)
Patient's spouse called and requested advised on care of sores on patient' lower legs.  Per Dr Melford Aase, carefully wash areas with a mild soap, pat dry and cover with a non-stick gauze.  Spouse was advised to call on Monday for an office visit, if legs do not improve over the weekend.

## 2015-05-06 ENCOUNTER — Other Ambulatory Visit: Payer: Self-pay | Admitting: Internal Medicine

## 2015-05-08 ENCOUNTER — Ambulatory Visit (INDEPENDENT_AMBULATORY_CARE_PROVIDER_SITE_OTHER): Payer: Medicare Other | Admitting: Internal Medicine

## 2015-05-08 ENCOUNTER — Encounter: Payer: Self-pay | Admitting: Internal Medicine

## 2015-05-08 VITALS — BP 96/54 | HR 62 | Temp 98.2°F | Resp 16 | Ht 69.0 in | Wt 138.0 lb

## 2015-05-08 DIAGNOSIS — I83009 Varicose veins of unspecified lower extremity with ulcer of unspecified site: Secondary | ICD-10-CM

## 2015-05-08 DIAGNOSIS — L97909 Non-pressure chronic ulcer of unspecified part of unspecified lower leg with unspecified severity: Secondary | ICD-10-CM | POA: Diagnosis not present

## 2015-05-08 DIAGNOSIS — I255 Ischemic cardiomyopathy: Secondary | ICD-10-CM

## 2015-05-08 NOTE — Progress Notes (Signed)
   Subjective:    Patient ID: Richard Moody, male    DOB: 1928-01-09, 82 y.o.   MRN: [id number]  HPI  Patient with PMH of CHF with EF of 32% w chronic hypotension on florinef therapy presents to the office for evaluation of bilateral calf wounds with leg swelling.   Leg wounds have been there for approximately 2 weeks and have not been improving.  He has not had any fevers, chills, nausea, vomiting, or spreading redness around the wounds.  His wife would like to see if we can get some wound care involved to help her out.     Review of Systems     Objective:   Physical Exam  Constitutional: He appears well-developed and well-nourished. No distress.  HENT:  Head: Normocephalic.  Mouth/Throat: Oropharynx is clear and moist. No oropharyngeal exudate.  Eyes: Conjunctivae are normal. No scleral icterus.  Neck: Normal range of motion. Neck supple. No JVD present. No thyromegaly present.  Cardiovascular: Normal rate, regular rhythm and normal heart sounds.  Exam reveals no gallop and no friction rub.   Pulses:      Dorsalis pedis pulses are 1+ on the right side, and 1+ on the left side.       Posterior tibial pulses are 1+ on the right side, and 1+ on the left side.  2+ PITTING pretibial edema bilaterally.  Pulmonary/Chest: Effort normal and breath sounds normal. No respiratory distress. He has no wheezes. He has no rales. He exhibits no tenderness.  Abdominal: Soft. Bowel sounds are normal. He exhibits no distension and no mass. There is no tenderness. There is no rebound and no guarding.  Musculoskeletal: Normal range of motion.  Lymphadenopathy:    He has no cervical adenopathy.  Neurological: He is alert.  Skin: Skin is warm and dry. He is not diaphoretic.     Several pressure areas of thickening to the heel and the superior tip of the right 3rd toe.    Psychiatric: He has a normal mood and affect. His behavior is normal. Judgment and thought content normal.  Nursing note and vitals  reviewed.         Assessment & Plan:    1. Venous stasis ulcer, unspecified laterality ([place]) -keep wounds clean and dry -avoid medications changes due to severe hypotension and heart failure -likely needs some una boots to help with leg swelling and help with wound healing. - AMB referral to wound care center

## 2015-05-10 ENCOUNTER — Other Ambulatory Visit: Payer: Self-pay | Admitting: Internal Medicine

## 2015-05-10 DIAGNOSIS — L97909 Non-pressure chronic ulcer of unspecified part of unspecified lower leg with unspecified severity: Principal | ICD-10-CM

## 2015-05-10 DIAGNOSIS — I83009 Varicose veins of unspecified lower extremity with ulcer of unspecified site: Secondary | ICD-10-CM

## 2015-05-12 DIAGNOSIS — I83222 Varicose veins of left lower extremity with both ulcer of calf and inflammation: Secondary | ICD-10-CM | POA: Diagnosis not present

## 2015-05-12 DIAGNOSIS — I83212 Varicose veins of right lower extremity with both ulcer of calf and inflammation: Secondary | ICD-10-CM | POA: Diagnosis not present

## 2015-05-12 DIAGNOSIS — Z95810 Presence of automatic (implantable) cardiac defibrillator: Secondary | ICD-10-CM | POA: Diagnosis not present

## 2015-05-12 DIAGNOSIS — J449 Chronic obstructive pulmonary disease, unspecified: Secondary | ICD-10-CM | POA: Diagnosis not present

## 2015-05-12 DIAGNOSIS — I5022 Chronic systolic (congestive) heart failure: Secondary | ICD-10-CM | POA: Diagnosis not present

## 2015-05-12 DIAGNOSIS — I110 Hypertensive heart disease with heart failure: Secondary | ICD-10-CM | POA: Diagnosis not present

## 2015-05-12 DIAGNOSIS — R7303 Prediabetes: Secondary | ICD-10-CM | POA: Diagnosis not present

## 2015-05-12 DIAGNOSIS — L97222 Non-pressure chronic ulcer of left calf with fat layer exposed: Secondary | ICD-10-CM | POA: Diagnosis not present

## 2015-05-12 DIAGNOSIS — Z7982 Long term (current) use of aspirin: Secondary | ICD-10-CM | POA: Diagnosis not present

## 2015-05-12 DIAGNOSIS — L97212 Non-pressure chronic ulcer of right calf with fat layer exposed: Secondary | ICD-10-CM | POA: Diagnosis not present

## 2015-05-12 DIAGNOSIS — I255 Ischemic cardiomyopathy: Secondary | ICD-10-CM | POA: Diagnosis not present

## 2015-05-17 DIAGNOSIS — L97212 Non-pressure chronic ulcer of right calf with fat layer exposed: Secondary | ICD-10-CM | POA: Diagnosis not present

## 2015-05-17 DIAGNOSIS — I5022 Chronic systolic (congestive) heart failure: Secondary | ICD-10-CM | POA: Diagnosis not present

## 2015-05-17 DIAGNOSIS — L97222 Non-pressure chronic ulcer of left calf with fat layer exposed: Secondary | ICD-10-CM | POA: Diagnosis not present

## 2015-05-17 DIAGNOSIS — I110 Hypertensive heart disease with heart failure: Secondary | ICD-10-CM | POA: Diagnosis not present

## 2015-05-17 DIAGNOSIS — I83212 Varicose veins of right lower extremity with both ulcer of calf and inflammation: Secondary | ICD-10-CM | POA: Diagnosis not present

## 2015-05-17 DIAGNOSIS — I83222 Varicose veins of left lower extremity with both ulcer of calf and inflammation: Secondary | ICD-10-CM | POA: Diagnosis not present

## 2015-05-18 ENCOUNTER — Encounter: Payer: Self-pay | Admitting: Internal Medicine

## 2015-05-18 ENCOUNTER — Ambulatory Visit (INDEPENDENT_AMBULATORY_CARE_PROVIDER_SITE_OTHER): Payer: Medicare Other | Admitting: Internal Medicine

## 2015-05-18 VITALS — BP 102/54 | HR 69 | Ht 72.0 in | Wt 136.0 lb

## 2015-05-18 DIAGNOSIS — Z4502 Encounter for adjustment and management of automatic implantable cardiac defibrillator: Secondary | ICD-10-CM

## 2015-05-18 DIAGNOSIS — I255 Ischemic cardiomyopathy: Secondary | ICD-10-CM

## 2015-05-18 DIAGNOSIS — I5022 Chronic systolic (congestive) heart failure: Secondary | ICD-10-CM | POA: Diagnosis not present

## 2015-05-18 LAB — CUP PACEART INCLINIC DEVICE CHECK
Brady Statistic AS VS Percent: 43.07 %
Brady Statistic RA Percent Paced: 56.92 %
Brady Statistic RV Percent Paced: 0.03 %
HIGH POWER IMPEDANCE MEASURED VALUE: 44 Ohm
HighPow Impedance: 57 Ohm
Implantable Lead Implant Date: 20100805
Implantable Lead Location: 753859
Implantable Lead Model: 5076
Implantable Lead Model: 6947
Lead Channel Impedance Value: 494 Ohm
Lead Channel Pacing Threshold Amplitude: 0.75 V
Lead Channel Pacing Threshold Amplitude: 0.75 V
Lead Channel Pacing Threshold Pulse Width: 0.4 ms
Lead Channel Pacing Threshold Pulse Width: 0.4 ms
Lead Channel Sensing Intrinsic Amplitude: 2.9 mV
Lead Channel Setting Pacing Amplitude: 2.5 V
MDC IDC LEAD IMPLANT DT: 20100805
MDC IDC LEAD LOCATION: 753860
MDC IDC MSMT BATTERY VOLTAGE: 2.75 V
MDC IDC MSMT LEADCHNL RV IMPEDANCE VALUE: 437 Ohm
MDC IDC MSMT LEADCHNL RV SENSING INTR AMPL: 10.3 mV
MDC IDC SESS DTM: 20170317181818
MDC IDC SET LEADCHNL RA PACING AMPLITUDE: 2 V
MDC IDC SET LEADCHNL RV PACING PULSEWIDTH: 0.4 ms
MDC IDC SET LEADCHNL RV SENSING SENSITIVITY: 0.3 mV
MDC IDC STAT BRADY AP VP PERCENT: 0.01 %
MDC IDC STAT BRADY AP VS PERCENT: 56.91 %
MDC IDC STAT BRADY AS VP PERCENT: 0.01 %

## 2015-05-18 MED ORDER — QUINAPRIL HCL 5 MG PO TABS: 5.0000 mg | ORAL_TABLET | Freq: Every day | ORAL | Status: DC

## 2015-05-18 NOTE — Patient Instructions (Addendum)
Medication Instructions:  Your physician has recommended you make the following change in your medication:  1) STOP Florinef 2) STOP Potassium 3) DECREASE  Quinapril to 5 mg daily  Labwork: Your physician recommends that you return for lab work in: 2 weeks for a BMET  Testing/Procedures: None ordered  Follow-Up: Remote monitoring is used to monitor your Pacemaker of ICD from home. This monitoring reduces the number of office visits required to check your device to one time per year. It allows [place] to keep an eye on the functioning of your device to ensure it is working properly. You are scheduled for a device check from home on 06/18/15. You may send your transmission at any time that day. If you have a wireless device, the transmission will be sent automatically. After your physician reviews your transmission, you will receive a postcard with your next transmission date.  Your physician recommends that you schedule a follow-up appointment in: 8 weeks with Tommye Standard, PA.   If you need a refill on your cardiac medications before your next appointment, please call your pharmacy.  Thank you for choosing [facility]!!

## 2015-05-18 NOTE — Progress Notes (Signed)
Patient Care Team: Unk Pinto, MD as PCP - General (Internal Medicine) Unk Pinto, MD (Internal Medicine) Darleen Crocker, MD as Consulting Physician (Ophthalmology) Deboraha Sprang, MD as Consulting Physician (Cardiology)   HPI  Richard Moody is a 82 y.o. male  seen in followup for ICD implanted in 2010.  He has a history of ischemic heart disease with prior bypass surgery depressed left ventricular function with EFof 32%. He has  chronic systolic heart failure but has done relatively well.    The patient has few complaints although he is not very ambulatory at this point. He recently had significant swelling in his lower extremities. This correlated with a optivol bump. This responded to an increase in his diuretics from 20--40 and wraps. His edema is largely improved. At some point his PCP also started him on Florinef for hypotension. This was in conjunction with a inhibitors and beta blockers     Past Medical History  Diagnosis Date  . Ischemic cardiomyopathy     EF 32%  . OA (osteoarthritis) of knee   . Ganglion cyst of wrist     LEFT WRIST  . MI, old     INFERIOR LATERAL  . Hyperlipidemia   . GERD (gastroesophageal reflux disease)   . ICD (implantable cardiac defibrillator) in place August 2010  . Hx of CABG 1992  . S/P cardiac cath 2006    Occluded SVG to OM. Managed medically  . Normal nuclear stress test June 2010    No ischemia. EF 32%  . Vitamin D deficiency   . Pre-diabetes     Past Surgical History  Procedure Laterality Date  . Cardiac catheterization  10/14/2004    THERE IS POSTERIOR LATERAL HYPOKINESIA. EF 30-35%  . Coronary artery bypass graft  1992  . Knee surgery      RIGHT KNEE  . Cardiac defibrillator placement  10/2008  . Inguinal hernia repair    . Cardiac catheterization  2006    OCCLUDED SVG TO OM  . Nuclear stress test  2010    EF 32%. No ischemia    Current Outpatient Prescriptions  Medication Sig Dispense Refill  .  ALPRAZolam (XANAX) 1 MG tablet 1/2 to 1 tablet 3 x daily as needed for anxiety or sleep 270 tablet 1  . aspirin 81 MG tablet Take 81 mg by mouth 3 (three) times a week.      . Cholecalciferol (VITAMIN D3) 5000 UNITS CAPS Take 2 capsules by mouth daily.      Marland Kitchen darifenacin (ENABLEX) 7.5 MG 24 hr tablet TAKE 1 TABLET EVERY DAY FORBLADDER 90 tablet 99  . diclofenac sodium (VOLTAREN) 1 % GEL Apply 2 g topically 4 (four) times daily. 100 g 0  . esomeprazole (NEXIUM) 40 MG capsule TAKE 1 CAPSULE DAILY AT 12 NOON FOR ACID REFLUX 90 capsule 1  . fexofenadine-pseudoephedrine (ALLEGRA-D) 60-120 MG per tablet Take 1 tablet by mouth daily.     . fish oil-omega-3 fatty acids 1000 MG capsule Take 2 g by mouth daily.      . furosemide (LASIX) 40 MG tablet TAKE 1 TABLET ONCE A DAY 180 tablet 1  . glucosamine-chondroitin 500-400 MG tablet Take 1 tablet by mouth 2 (two) times daily.      . halobetasol (ULTRAVATE) 0.05 % cream Apply topically 2 (two) times daily. 50 g 6  . Halobetasol Cr & Lactic Ac Cr 0.05 & 10 % KIT     . IRON PO Take  45 mg by mouth daily.     . Magnesium 250 MG TABS Take by mouth 2 (two) times daily.    . metoprolol succinate (TOPROL-XL) 25 MG 24 hr tablet Take 1 tablet (25 mg total) by mouth daily. 90 tablet 3  . Multiple Minerals-Vitamins (CITRACAL PLUS PO) Take 1 capsule by mouth daily.     . NON FORMULARY Halo beta a sol cream .05% 2-4- x per week as needed     . OVER THE COUNTER MEDICATION claritin 10 mg daily PRN    . polyethylene glycol (MIRALAX / GLYCOLAX) packet Take 17 g by mouth as needed for moderate constipation.     . potassium chloride SA (K-DUR,KLOR-CON) 20 MEQ tablet Take 1 tablet (20 mEq total) by mouth daily. 90 tablet 2  . quinapril (ACCUPRIL) 10 MG tablet Take 1 tablet (10 mg total) by mouth daily. 90 tablet 11  . Red Yeast Rice Extract (RED YEAST RICE PO) Take by mouth daily. 2 DAILY     . triamcinolone cream (KENALOG) 0.1 % Apply 1 application topically as needed (for  itching).     . vitamin C (ASCORBIC ACID) 500 MG tablet Take 1,000 mg by mouth daily.       No current facility-administered medications for this visit.    Allergies  Allergen Reactions  . Lipitor [Atorvastatin]     Elevated cpk   . Mobic [Meloxicam] Other (See Comments)    edema  . Nsaids Other (See Comments)    dyspepsia    Review of Systems negative except from HPI and PMH  Physical Exam BP 102/54 mmHg  Pulse 69  Ht 6' (1.829 m)  Wt 136 lb (61.689 kg)  BMI 18.44 kg/m2 Well developed and cachectic older man in no acute distress HENT normal Neck supple with JVP-flat Clear Device pocket well healed; without hematoma or erythema.  There is no tethering  Regular rate and rhythm, no murmurs or gallops Abd-soft with active BS No Clubbing cyanosis edema Skin-warm and dry A & Oriented  Grossly normal sensory and motor function  ECG Apacing  Prior IMI LVH repol   Assessment and  Plan  Ischemic cardiomyopathy Without symptoms of ischemia  Congestive heart failure-chronic-systolic  Euvolemic continue current meds  Implantable  defibrillator-[employer]tronic  The patient's device was interrogated.  The information was reviewed. No changes were made in the programming.    PVCs  The patient's volume status is much improved. We will discontinue the Florinef. We will decrease his quinapril and potentially can decrease his metoprolol if hypotension remains a problem.  The PVCs are significantly increased in her comprising about 10% of the time. This could certainly be aggravating his cardiomyopathy. What is not clear is whether they are primary or secondary to the heart failure. We will plan to obtain a second interrogation in about 4 weeks. We'll have him come back to see RUAP and about 8 weeks to assess heart failure.  We spent more than 50% of our >25 min visit in face to face counseling regarding the above

## 2015-05-21 DIAGNOSIS — I83212 Varicose veins of right lower extremity with both ulcer of calf and inflammation: Secondary | ICD-10-CM | POA: Diagnosis not present

## 2015-05-21 DIAGNOSIS — I83222 Varicose veins of left lower extremity with both ulcer of calf and inflammation: Secondary | ICD-10-CM | POA: Diagnosis not present

## 2015-05-21 DIAGNOSIS — L97222 Non-pressure chronic ulcer of left calf with fat layer exposed: Secondary | ICD-10-CM | POA: Diagnosis not present

## 2015-05-21 DIAGNOSIS — L97212 Non-pressure chronic ulcer of right calf with fat layer exposed: Secondary | ICD-10-CM | POA: Diagnosis not present

## 2015-05-21 DIAGNOSIS — I110 Hypertensive heart disease with heart failure: Secondary | ICD-10-CM | POA: Diagnosis not present

## 2015-05-21 DIAGNOSIS — I5022 Chronic systolic (congestive) heart failure: Secondary | ICD-10-CM | POA: Diagnosis not present

## 2015-05-24 DIAGNOSIS — L97212 Non-pressure chronic ulcer of right calf with fat layer exposed: Secondary | ICD-10-CM | POA: Diagnosis not present

## 2015-05-24 DIAGNOSIS — I83222 Varicose veins of left lower extremity with both ulcer of calf and inflammation: Secondary | ICD-10-CM | POA: Diagnosis not present

## 2015-05-24 DIAGNOSIS — I83212 Varicose veins of right lower extremity with both ulcer of calf and inflammation: Secondary | ICD-10-CM | POA: Diagnosis not present

## 2015-05-24 DIAGNOSIS — L97222 Non-pressure chronic ulcer of left calf with fat layer exposed: Secondary | ICD-10-CM | POA: Diagnosis not present

## 2015-05-24 DIAGNOSIS — I5022 Chronic systolic (congestive) heart failure: Secondary | ICD-10-CM | POA: Diagnosis not present

## 2015-05-24 DIAGNOSIS — I110 Hypertensive heart disease with heart failure: Secondary | ICD-10-CM | POA: Diagnosis not present

## 2015-05-31 DIAGNOSIS — I83222 Varicose veins of left lower extremity with both ulcer of calf and inflammation: Secondary | ICD-10-CM | POA: Diagnosis not present

## 2015-05-31 DIAGNOSIS — L97212 Non-pressure chronic ulcer of right calf with fat layer exposed: Secondary | ICD-10-CM | POA: Diagnosis not present

## 2015-05-31 DIAGNOSIS — I83212 Varicose veins of right lower extremity with both ulcer of calf and inflammation: Secondary | ICD-10-CM | POA: Diagnosis not present

## 2015-05-31 DIAGNOSIS — L97222 Non-pressure chronic ulcer of left calf with fat layer exposed: Secondary | ICD-10-CM | POA: Diagnosis not present

## 2015-05-31 DIAGNOSIS — I5022 Chronic systolic (congestive) heart failure: Secondary | ICD-10-CM | POA: Diagnosis not present

## 2015-05-31 DIAGNOSIS — I110 Hypertensive heart disease with heart failure: Secondary | ICD-10-CM | POA: Diagnosis not present

## 2015-06-01 ENCOUNTER — Other Ambulatory Visit (INDEPENDENT_AMBULATORY_CARE_PROVIDER_SITE_OTHER): Payer: Medicare Other | Admitting: *Deleted

## 2015-06-01 DIAGNOSIS — E782 Mixed hyperlipidemia: Secondary | ICD-10-CM | POA: Diagnosis not present

## 2015-06-01 DIAGNOSIS — I10 Essential (primary) hypertension: Secondary | ICD-10-CM | POA: Diagnosis not present

## 2015-06-01 LAB — BASIC METABOLIC PANEL
BUN: 32 mg/dL — ABNORMAL HIGH (ref 7–25)
CHLORIDE: 100 mmol/L (ref 98–110)
CO2: 27 mmol/L (ref 20–31)
Calcium: 8.9 mg/dL (ref 8.6–10.3)
Creat: 1.03 mg/dL (ref 0.70–1.11)
Glucose, Bld: 81 mg/dL (ref 65–99)
POTASSIUM: 4.6 mmol/L (ref 3.5–5.3)
SODIUM: 138 mmol/L (ref 135–146)

## 2015-06-01 NOTE — Addendum Note (Signed)
Addended by: Eulis Foster on: 06/01/2015 11:58 AM   Modules accepted: Orders

## 2015-06-05 DIAGNOSIS — Z961 Presence of intraocular lens: Secondary | ICD-10-CM | POA: Diagnosis not present

## 2015-06-05 DIAGNOSIS — H18413 Arcus senilis, bilateral: Secondary | ICD-10-CM | POA: Diagnosis not present

## 2015-06-05 DIAGNOSIS — H02839 Dermatochalasis of unspecified eye, unspecified eyelid: Secondary | ICD-10-CM | POA: Diagnosis not present

## 2015-06-05 DIAGNOSIS — H40003 Preglaucoma, unspecified, bilateral: Secondary | ICD-10-CM | POA: Diagnosis not present

## 2015-06-08 DIAGNOSIS — I83212 Varicose veins of right lower extremity with both ulcer of calf and inflammation: Secondary | ICD-10-CM | POA: Diagnosis not present

## 2015-06-08 DIAGNOSIS — I110 Hypertensive heart disease with heart failure: Secondary | ICD-10-CM | POA: Diagnosis not present

## 2015-06-08 DIAGNOSIS — L97212 Non-pressure chronic ulcer of right calf with fat layer exposed: Secondary | ICD-10-CM | POA: Diagnosis not present

## 2015-06-08 DIAGNOSIS — L97222 Non-pressure chronic ulcer of left calf with fat layer exposed: Secondary | ICD-10-CM | POA: Diagnosis not present

## 2015-06-08 DIAGNOSIS — I5022 Chronic systolic (congestive) heart failure: Secondary | ICD-10-CM | POA: Diagnosis not present

## 2015-06-08 DIAGNOSIS — I83222 Varicose veins of left lower extremity with both ulcer of calf and inflammation: Secondary | ICD-10-CM | POA: Diagnosis not present

## 2015-06-12 ENCOUNTER — Other Ambulatory Visit: Payer: Self-pay | Admitting: *Deleted

## 2015-06-12 DIAGNOSIS — N400 Enlarged prostate without lower urinary tract symptoms: Secondary | ICD-10-CM

## 2015-06-12 DIAGNOSIS — I255 Ischemic cardiomyopathy: Secondary | ICD-10-CM

## 2015-06-12 DIAGNOSIS — I5022 Chronic systolic (congestive) heart failure: Secondary | ICD-10-CM

## 2015-06-12 DIAGNOSIS — R634 Abnormal weight loss: Secondary | ICD-10-CM

## 2015-06-12 DIAGNOSIS — R6251 Failure to thrive (child): Secondary | ICD-10-CM

## 2015-06-14 DIAGNOSIS — I5022 Chronic systolic (congestive) heart failure: Secondary | ICD-10-CM | POA: Diagnosis not present

## 2015-06-14 DIAGNOSIS — L97212 Non-pressure chronic ulcer of right calf with fat layer exposed: Secondary | ICD-10-CM | POA: Diagnosis not present

## 2015-06-14 DIAGNOSIS — L97222 Non-pressure chronic ulcer of left calf with fat layer exposed: Secondary | ICD-10-CM | POA: Diagnosis not present

## 2015-06-14 DIAGNOSIS — I110 Hypertensive heart disease with heart failure: Secondary | ICD-10-CM | POA: Diagnosis not present

## 2015-06-14 DIAGNOSIS — I83222 Varicose veins of left lower extremity with both ulcer of calf and inflammation: Secondary | ICD-10-CM | POA: Diagnosis not present

## 2015-06-14 DIAGNOSIS — I83212 Varicose veins of right lower extremity with both ulcer of calf and inflammation: Secondary | ICD-10-CM | POA: Diagnosis not present

## 2015-06-18 ENCOUNTER — Encounter: Payer: Medicare Other | Admitting: *Deleted

## 2015-06-18 ENCOUNTER — Telehealth: Payer: Self-pay | Admitting: Cardiology

## 2015-06-18 NOTE — Telephone Encounter (Signed)
Spoke with pt and reminded pt of remote transmission that is due today. Pt verbalized understanding.   

## 2015-06-22 DIAGNOSIS — I5022 Chronic systolic (congestive) heart failure: Secondary | ICD-10-CM | POA: Diagnosis not present

## 2015-06-22 DIAGNOSIS — I110 Hypertensive heart disease with heart failure: Secondary | ICD-10-CM | POA: Diagnosis not present

## 2015-06-22 DIAGNOSIS — I83212 Varicose veins of right lower extremity with both ulcer of calf and inflammation: Secondary | ICD-10-CM | POA: Diagnosis not present

## 2015-06-22 DIAGNOSIS — L97212 Non-pressure chronic ulcer of right calf with fat layer exposed: Secondary | ICD-10-CM | POA: Diagnosis not present

## 2015-06-22 DIAGNOSIS — L97222 Non-pressure chronic ulcer of left calf with fat layer exposed: Secondary | ICD-10-CM | POA: Diagnosis not present

## 2015-06-22 DIAGNOSIS — I83222 Varicose veins of left lower extremity with both ulcer of calf and inflammation: Secondary | ICD-10-CM | POA: Diagnosis not present

## 2015-06-27 DIAGNOSIS — I110 Hypertensive heart disease with heart failure: Secondary | ICD-10-CM | POA: Diagnosis not present

## 2015-06-27 DIAGNOSIS — L97222 Non-pressure chronic ulcer of left calf with fat layer exposed: Secondary | ICD-10-CM | POA: Diagnosis not present

## 2015-06-27 DIAGNOSIS — I83222 Varicose veins of left lower extremity with both ulcer of calf and inflammation: Secondary | ICD-10-CM | POA: Diagnosis not present

## 2015-06-27 DIAGNOSIS — L97212 Non-pressure chronic ulcer of right calf with fat layer exposed: Secondary | ICD-10-CM | POA: Diagnosis not present

## 2015-06-27 DIAGNOSIS — I83212 Varicose veins of right lower extremity with both ulcer of calf and inflammation: Secondary | ICD-10-CM | POA: Diagnosis not present

## 2015-06-27 DIAGNOSIS — I5022 Chronic systolic (congestive) heart failure: Secondary | ICD-10-CM | POA: Diagnosis not present

## 2015-07-03 ENCOUNTER — Encounter: Payer: Self-pay | Admitting: *Deleted

## 2015-07-04 DIAGNOSIS — L97222 Non-pressure chronic ulcer of left calf with fat layer exposed: Secondary | ICD-10-CM | POA: Diagnosis not present

## 2015-07-04 DIAGNOSIS — I5022 Chronic systolic (congestive) heart failure: Secondary | ICD-10-CM | POA: Diagnosis not present

## 2015-07-04 DIAGNOSIS — I110 Hypertensive heart disease with heart failure: Secondary | ICD-10-CM | POA: Diagnosis not present

## 2015-07-04 DIAGNOSIS — L97212 Non-pressure chronic ulcer of right calf with fat layer exposed: Secondary | ICD-10-CM | POA: Diagnosis not present

## 2015-07-04 DIAGNOSIS — I83212 Varicose veins of right lower extremity with both ulcer of calf and inflammation: Secondary | ICD-10-CM | POA: Diagnosis not present

## 2015-07-04 DIAGNOSIS — I83222 Varicose veins of left lower extremity with both ulcer of calf and inflammation: Secondary | ICD-10-CM | POA: Diagnosis not present

## 2015-07-05 ENCOUNTER — Telehealth: Payer: Self-pay | Admitting: *Deleted

## 2015-07-05 NOTE — Telephone Encounter (Signed)
Patient called requesting Rx for [facility].  Starlyn Skeans, PA-C wrote Rx and placed up front for patient to pick up.

## 2015-07-06 ENCOUNTER — Encounter: Payer: Self-pay | Admitting: Internal Medicine

## 2015-07-06 ENCOUNTER — Ambulatory Visit (INDEPENDENT_AMBULATORY_CARE_PROVIDER_SITE_OTHER): Payer: Medicare Other | Admitting: Nurse Practitioner

## 2015-07-06 ENCOUNTER — Encounter: Payer: Self-pay | Admitting: Nurse Practitioner

## 2015-07-06 VITALS — BP 90/54 | HR 43 | Ht 72.0 in | Wt 142.0 lb

## 2015-07-06 DIAGNOSIS — I493 Ventricular premature depolarization: Secondary | ICD-10-CM | POA: Diagnosis not present

## 2015-07-06 DIAGNOSIS — I255 Ischemic cardiomyopathy: Secondary | ICD-10-CM

## 2015-07-06 DIAGNOSIS — I5022 Chronic systolic (congestive) heart failure: Secondary | ICD-10-CM

## 2015-07-06 MED ORDER — AMIODARONE HCL 200 MG PO TABS: 200.0000 mg | ORAL_TABLET | Freq: Every day | ORAL | Status: DC

## 2015-07-06 NOTE — Progress Notes (Signed)
Patient referred to [facility] clinic by [name], NP.  Met patient and wife in office and ICM intro given.  Explained monthly remote monitoring.   1st ICM remote transmission scheduled for 08/08/2015.   DPR completed at visit to speak with wife.

## 2015-07-06 NOTE — Patient Instructions (Addendum)
Medication Instructions:   START TAKING LASIX ONCE A DAY EVERY DAY FOR 3 DAYS A  START TAKING AMIODARONE 200 MG ONCE A DAY   If you need a refill on your cardiac medications before your next appointment, please call your pharmacy.  Labwork: NONE ORDER TODAY    Testing/Procedures: NONE ORDER TODAY    Follow-Up: 4 WEEKS WITH DR Caryl Comes    Any Other Special Instructions Will Be Listed Below (If Applicable).

## 2015-07-06 NOTE — Progress Notes (Signed)
Electrophysiology Office Note Date: 07/06/2015  ID:  Richard Moody, DOB October 18, 1927, MRN [id number]  PCP: Alesia Richards, MD Electrophysiologist: Caryl Comes  CC: heart failure follow up  Richard Moody is a 82 y.o. male seen today for Dr Caryl Comes.  He presents today for routine electrophysiology followup.  He was seen by Dr Caryl Comes 2 months ago with worsening LE edema and hypotension.  Several medication changes were made and he presents today for further evaluation. He is seen with his wife today who helps to provide history.  Since last being seen in our clinic, the patient reports that he has had increased fatigue. He sleeps well at night but then naps after breakfast and again after lunch.  His LE edema has improved with UNA boots and his ulcers have healed. He is currently wearing compression hose and has persistent edema. He denies chest pain, palpitations, dyspnea, PND, orthopnea, nausea, vomiting, dizziness, syncope, weight gain, or early satiety.  He has not had ICD shocks.   Echo 09/2014 demonstrated EF 46-65%, grade 1 diastolic dysfunction, mild MR, PA pressure 35  Device History: MDT dual chamber ICD implanted 2010 for ICM History of appropriate therapy: No History of AAD therapy: No   Past Medical History  Diagnosis Date  . Ischemic cardiomyopathy     EF 32%  . OA (osteoarthritis) of knee   . Ganglion cyst of wrist     LEFT WRIST  . MI, old     INFERIOR LATERAL  . Hyperlipidemia   . GERD (gastroesophageal reflux disease)   . ICD (implantable cardiac defibrillator) in place August 2010  . Hx of CABG 1992  . S/P cardiac cath 2006    Occluded SVG to OM. Managed medically  . Normal nuclear stress test June 2010    No ischemia. EF 32%  . Vitamin D deficiency   . Pre-diabetes    Past Surgical History  Procedure Laterality Date  . Cardiac catheterization  10/14/2004    THERE IS POSTERIOR LATERAL HYPOKINESIA. EF 30-35%  . Coronary artery bypass graft  1992  . Knee  surgery      RIGHT KNEE  . Cardiac defibrillator placement  10/2008  . Inguinal hernia repair    . Cardiac catheterization  2006    OCCLUDED SVG TO OM  . Nuclear stress test  2010    EF 32%. No ischemia    Current Outpatient Prescriptions  Medication Sig Dispense Refill  . ALPRAZolam (XANAX) 1 MG tablet Take 1 mg by mouth at bedtime as needed for anxiety or sleep (1/2 to 1 tablet, 3 x daily as needed for anxiety or sleep).    Marland Kitchen aspirin 81 MG tablet Take 81 mg by mouth 3 (three) times a week.      . Cholecalciferol (VITAMIN D3) 5000 UNITS CAPS Take 2 capsules by mouth daily.      Marland Kitchen darifenacin (ENABLEX) 7.5 MG 24 hr tablet Take 7.5 mg by mouth daily.    . diclofenac sodium (VOLTAREN) 1 % GEL Apply 2 g topically 4 (four) times daily. 100 g 0  . esomeprazole (NEXIUM) 40 MG capsule Take 1 capsule by mouth daily.    . fexofenadine-pseudoephedrine (ALLEGRA-D) 60-120 MG per tablet Take 1 tablet by mouth daily.     . fish oil-omega-3 fatty acids 1000 MG capsule Take 2 g by mouth daily.      . furosemide (LASIX) 40 MG tablet Take one tablet (40 mg) by mouth every other day    .  glucosamine-chondroitin 500-400 MG tablet Take 1 tablet by mouth 2 (two) times daily.      . halobetasol (ULTRAVATE) 0.05 % cream Apply topically 2 (two) times daily. 50 g 6  . IRON PO Take 45 mg by mouth daily.     . Magnesium 250 MG TABS Take 1 tablet by mouth 2 (two) times daily.     . metoprolol succinate (TOPROL-XL) 25 MG 24 hr tablet Take 1 tablet (25 mg total) by mouth daily. 90 tablet 3  . Multiple Minerals-Vitamins (CITRACAL PLUS PO) Take 1 capsule by mouth daily.     . NON FORMULARY Halo beta a sol cream .05% 2-4- x per week as needed     . OVER THE COUNTER MEDICATION Take 1 tablet by mouth daily. claritin 10 mg daily for allergies PRN    . polyethylene glycol (MIRALAX / GLYCOLAX) packet Take 17 g by mouth as needed for moderate constipation.     . quinapril (ACCUPRIL) 5 MG tablet Take 1 tablet (5 mg total) by  mouth daily. 30 tablet 3  . Red Yeast Rice Extract (RED YEAST RICE PO) Take 2 capsules by mouth daily. 2 DAILY    . vitamin C (ASCORBIC ACID) 500 MG tablet Take 1,000 mg by mouth daily.      Marland Kitchen amiodarone (PACERONE) 200 MG tablet Take 1 tablet (200 mg total) by mouth daily. 90 tablet 3   No current facility-administered medications for this visit.    Allergies:   Lipitor; Mobic; and Nsaids   Social History: Social History   Social History  . Marital Status: Married    Spouse Name: N/A  . Number of Children: N/A  . Years of Education: N/A   Occupational History  . Not on file.   Social History Main Topics  . Smoking status: Former Smoker    Quit date: 03/03/1982  . Smokeless tobacco: Never Used  . Alcohol Use: No  . Drug Use: No  . Sexual Activity: Not on file   Other Topics Concern  . Not on file   Social History Narrative    Family History: Family History  Problem Relation Age of Onset  . Cancer Mother     ABDOMINAL CANCER  . Hypertension Father   . Stroke Father   . Breast cancer Sister     Review of Systems: All other systems reviewed and are otherwise negative except as noted above.   Physical Exam: VS:  BP 90/54 mmHg  Pulse 43  Ht 6' (1.829 m)  Wt 142 lb (64.411 kg)  BMI 19.25 kg/m2  SpO2 97% , BMI Body mass index is 19.25 kg/(m^2).  GEN- The patient is elderly, frail, cachectic appearing, alert and oriented x 3 today.   HEENT: normocephalic, atraumatic; sclera clear, conjunctiva pink; hearing intact; oropharynx clear; neck supple, +JVD Lungs- Clear to ausculation bilaterally, normal work of breathing.  No wheezes, rales, rhonchi Heart- Regular rate and rhythm with frequent ectopy GI- soft, non-tender, non-distended, bowel sounds present  Extremities- no clubbing, cyanosis, 1+ BLE edema, wearing compression hose MS- no significant deformity or atrophy Skin- warm and dry, no rash or lesion; ICD pocket well healed Psych- euthymic mood, full  affect Neuro- strength and sensation are intact  ICD interrogation- reviewed in detail today,  See PACEART report  EKG:  EKG is not ordered today.  Recent Labs: 04/09/2015: ALT 17; Hemoglobin 11.0*; Magnesium 2.1; Platelets 249; TSH 1.08 06/01/2015: BUN 32*; Creat 1.03; Potassium 4.6; Sodium 138   Wt Readings  from Last 3 Encounters:  07/06/15 142 lb (64.411 kg)  05/18/15 136 lb (61.689 kg)  05/08/15 138 lb (62.596 kg)     Other studies Reviewed: Additional studies/ records that were reviewed today include: Dr Caryl Comes and Cecille Rubin Gerhardt's office notes, last echo, labs   Assessment and Plan:  1.  Chronic systolic dysfunction Slightly volume overloaded today Will increase Lasix to 63m daily for 3 days  Normal ICD function See [facility] report No [facility]   2.  CAD/ICM No recent ischemic symptoms Continue medical therapy  3.  PVC's Burden by device interrogation today significant - likely 35-40% of beats This is new looking at previous interrogations With hypotension, I am reluctant to increase BB Will add amiodarone 2042mdaily today - recent LFT's and TSH normal  I am concerned about Mr Hopf's long term prognosis with increased PVC burden, signficiant fatigue, and cachectic appearance. I think a goals of care discussion is very appropriate. As I met him for the first time today, I did not broach the subject. I did make a follow up appt for the patient with Dr KlCaryl Comesn 4 weeks to review PVC burden after amiodarone added and also to discuss ICD therapies, code status.    Current medicines are reviewed at length with the patient today.   The patient does not have concerns regarding his medicines.  The following changes were made today:  Add amiodarone 20038maily  Labs/ tests ordered today include: none   Disposition:   Follow up with Dr KleCaryl Comes 4 weeks, [facility]    Signed, AmbChanetta MarshallP 07/06/2015 12:12 PM  CHMGulfi[facility]eClarence [phone]) [phone])

## 2015-07-09 ENCOUNTER — Other Ambulatory Visit: Payer: Self-pay | Admitting: *Deleted

## 2015-07-09 MED ORDER — AMIODARONE HCL 200 MG PO TABS: 200.0000 mg | ORAL_TABLET | Freq: Every day | ORAL | Status: DC

## 2015-07-09 NOTE — Telephone Encounter (Signed)
Patients wife called and stated that they went to [facility] to pick up the new rx, amiodarone, but the pharmacy did not have it. Per epic the rx was sent to the patients [facility] pharmacy. She stated that she had actually requested the rx be sent to the local pharmacy to see how he tolerated it before they ordered a 90 day supply. I will send a ten day supply to patients local pharmacy to get him through until the full rx arrives.

## 2015-07-10 DIAGNOSIS — I83212 Varicose veins of right lower extremity with both ulcer of calf and inflammation: Secondary | ICD-10-CM | POA: Diagnosis not present

## 2015-07-10 DIAGNOSIS — L97212 Non-pressure chronic ulcer of right calf with fat layer exposed: Secondary | ICD-10-CM | POA: Diagnosis not present

## 2015-07-10 DIAGNOSIS — I83222 Varicose veins of left lower extremity with both ulcer of calf and inflammation: Secondary | ICD-10-CM | POA: Diagnosis not present

## 2015-07-10 DIAGNOSIS — L97222 Non-pressure chronic ulcer of left calf with fat layer exposed: Secondary | ICD-10-CM | POA: Diagnosis not present

## 2015-07-10 DIAGNOSIS — I5022 Chronic systolic (congestive) heart failure: Secondary | ICD-10-CM | POA: Diagnosis not present

## 2015-07-10 DIAGNOSIS — I110 Hypertensive heart disease with heart failure: Secondary | ICD-10-CM | POA: Diagnosis not present

## 2015-07-11 ENCOUNTER — Other Ambulatory Visit: Payer: Self-pay | Admitting: Internal Medicine

## 2015-07-11 ENCOUNTER — Telehealth: Payer: Self-pay

## 2015-07-11 DIAGNOSIS — I255 Ischemic cardiomyopathy: Secondary | ICD-10-CM | POA: Diagnosis not present

## 2015-07-11 DIAGNOSIS — Z95810 Presence of automatic (implantable) cardiac defibrillator: Secondary | ICD-10-CM | POA: Diagnosis not present

## 2015-07-11 DIAGNOSIS — I5022 Chronic systolic (congestive) heart failure: Secondary | ICD-10-CM | POA: Diagnosis not present

## 2015-07-11 DIAGNOSIS — J449 Chronic obstructive pulmonary disease, unspecified: Secondary | ICD-10-CM | POA: Diagnosis not present

## 2015-07-11 DIAGNOSIS — I110 Hypertensive heart disease with heart failure: Secondary | ICD-10-CM | POA: Diagnosis not present

## 2015-07-11 DIAGNOSIS — Z7982 Long term (current) use of aspirin: Secondary | ICD-10-CM | POA: Diagnosis not present

## 2015-07-11 DIAGNOSIS — I872 Venous insufficiency (chronic) (peripheral): Secondary | ICD-10-CM | POA: Diagnosis not present

## 2015-07-11 DIAGNOSIS — I493 Ventricular premature depolarization: Secondary | ICD-10-CM | POA: Diagnosis not present

## 2015-07-11 NOTE — Telephone Encounter (Signed)
Returned call to [facility] Nurse, Fuller Plan.  She is providing home care to patient.  She reported his BP on 07/10/2015 was 80/50 and pulse 138.  She stated she wanted Dr Caryl Comes to know that his BP was running low but thought it could be related to the increase in Lasix x 3 days as prescribed by [name], NP on 07/06/2015.  She reported he was going to start the Amiodarone today and would be monitoring for side effects.  She will continue home visits until he is stable on new med changes.  Advised would inform Dr Caryl Comes of the low BP.  Patients office BP on 07/06/2015 was 90/54.  She can be reached at [phone].

## 2015-07-12 ENCOUNTER — Ambulatory Visit: Payer: Self-pay | Admitting: Physician Assistant

## 2015-07-12 NOTE — Telephone Encounter (Signed)
EGM reviewed by device tech and Dr Caryl Comes.  Advised Dr Caryl Comes that wife reported no HR above 64.  No changes today.    Attempted call to patient/wife and no answer. Left message on voice mail for return call.

## 2015-07-12 NOTE — Telephone Encounter (Signed)
His HR is not notmal elevated pleae have him sent transmission and lets look at egm Thanks

## 2015-07-12 NOTE — Telephone Encounter (Signed)
Call to patient and spoke with wife (DPR).  Advised Dr Caryl Comes requested he send remote transmission.  Discussed patients BP readings, pulse and weights.  She advised sometimes patient gets mixed up on his readings.  Asked if the HR had been 138 in the past few days and she stated no, but his weight was 138.  She reported pulse has ranged 43-64, BP 80/50 to 100/55 and weight range 134-138.   Transmission will be sent in next hour to look at [facility] as requested.

## 2015-07-16 ENCOUNTER — Other Ambulatory Visit: Payer: Self-pay | Admitting: *Deleted

## 2015-07-16 MED ORDER — ALPRAZOLAM 1 MG PO TABS: ORAL_TABLET | ORAL | Status: DC

## 2015-07-17 DIAGNOSIS — I255 Ischemic cardiomyopathy: Secondary | ICD-10-CM | POA: Diagnosis not present

## 2015-07-17 DIAGNOSIS — I872 Venous insufficiency (chronic) (peripheral): Secondary | ICD-10-CM | POA: Diagnosis not present

## 2015-07-17 DIAGNOSIS — I5022 Chronic systolic (congestive) heart failure: Secondary | ICD-10-CM | POA: Diagnosis not present

## 2015-07-17 DIAGNOSIS — I493 Ventricular premature depolarization: Secondary | ICD-10-CM | POA: Diagnosis not present

## 2015-07-17 DIAGNOSIS — I110 Hypertensive heart disease with heart failure: Secondary | ICD-10-CM | POA: Diagnosis not present

## 2015-07-17 DIAGNOSIS — J449 Chronic obstructive pulmonary disease, unspecified: Secondary | ICD-10-CM | POA: Diagnosis not present

## 2015-07-18 ENCOUNTER — Ambulatory Visit (INDEPENDENT_AMBULATORY_CARE_PROVIDER_SITE_OTHER): Payer: Medicare Other | Admitting: Physician Assistant

## 2015-07-18 ENCOUNTER — Encounter: Payer: Self-pay | Admitting: Physician Assistant

## 2015-07-18 VITALS — BP 100/66 | HR 69 | Temp 97.5°F | Resp 14 | Ht 72.0 in | Wt 138.0 lb

## 2015-07-18 DIAGNOSIS — Z79899 Other long term (current) drug therapy: Secondary | ICD-10-CM | POA: Diagnosis not present

## 2015-07-18 DIAGNOSIS — N3281 Overactive bladder: Secondary | ICD-10-CM

## 2015-07-18 DIAGNOSIS — Z95810 Presence of automatic (implantable) cardiac defibrillator: Secondary | ICD-10-CM | POA: Diagnosis not present

## 2015-07-18 DIAGNOSIS — I5022 Chronic systolic (congestive) heart failure: Secondary | ICD-10-CM

## 2015-07-18 DIAGNOSIS — I255 Ischemic cardiomyopathy: Secondary | ICD-10-CM

## 2015-07-18 DIAGNOSIS — I10 Essential (primary) hypertension: Secondary | ICD-10-CM

## 2015-07-18 DIAGNOSIS — R7303 Prediabetes: Secondary | ICD-10-CM | POA: Diagnosis not present

## 2015-07-18 DIAGNOSIS — L280 Lichen simplex chronicus: Secondary | ICD-10-CM

## 2015-07-18 DIAGNOSIS — R6889 Other general symptoms and signs: Secondary | ICD-10-CM

## 2015-07-18 DIAGNOSIS — R7309 Other abnormal glucose: Secondary | ICD-10-CM | POA: Diagnosis not present

## 2015-07-18 DIAGNOSIS — Z0001 Encounter for general adult medical examination with abnormal findings: Secondary | ICD-10-CM

## 2015-07-18 DIAGNOSIS — E559 Vitamin D deficiency, unspecified: Secondary | ICD-10-CM | POA: Diagnosis not present

## 2015-07-18 DIAGNOSIS — E782 Mixed hyperlipidemia: Secondary | ICD-10-CM | POA: Diagnosis not present

## 2015-07-18 DIAGNOSIS — E875 Hyperkalemia: Secondary | ICD-10-CM

## 2015-07-18 DIAGNOSIS — J449 Chronic obstructive pulmonary disease, unspecified: Secondary | ICD-10-CM | POA: Diagnosis not present

## 2015-07-18 DIAGNOSIS — K219 Gastro-esophageal reflux disease without esophagitis: Secondary | ICD-10-CM | POA: Diagnosis not present

## 2015-07-18 LAB — HEPATIC FUNCTION PANEL
ALBUMIN: 4.1 g/dL (ref 3.6–5.1)
ALK PHOS: 100 U/L (ref 40–115)
ALT: 12 U/L (ref 9–46)
AST: 16 U/L (ref 10–35)
Bilirubin, Direct: 0.1 mg/dL (ref <=0.2)
Indirect Bilirubin: 0.4 mg/dL (ref 0.2–1.2)
TOTAL PROTEIN: 6.9 g/dL (ref 6.1–8.1)
Total Bilirubin: 0.5 mg/dL (ref 0.2–1.2)

## 2015-07-18 LAB — LIPID PANEL
CHOL/HDL RATIO: 2.5 ratio (ref <=5.0)
CHOLESTEROL: 175 mg/dL (ref 125–200)
HDL: 70 mg/dL (ref >=40)
LDL Cholesterol: 89 mg/dL (ref <130)
TRIGLYCERIDES: 81 mg/dL (ref <150)
VLDL: 16 mg/dL (ref <30)

## 2015-07-18 LAB — CBC WITH DIFFERENTIAL/PLATELET
BASOS ABS: 53 {cells}/uL (ref 0–200)
Basophils Relative: 1 %
EOS ABS: 159 {cells}/uL (ref 15–500)
Eosinophils Relative: 3 %
HCT: 34.6 % — ABNORMAL LOW (ref 38.5–50.0)
Hemoglobin: 11.5 g/dL — ABNORMAL LOW (ref 13.2–17.1)
LYMPHS PCT: 22 %
Lymphs Abs: 1166 cells/uL (ref 850–3900)
MCH: 30.9 pg (ref 27.0–33.0)
MCHC: 33.2 g/dL (ref 32.0–36.0)
MCV: 93 fL (ref 80.0–100.0)
MONOS PCT: 10 %
MPV: 10.2 fL (ref 7.5–12.5)
Monocytes Absolute: 530 cells/uL (ref 200–950)
Neutro Abs: 3392 cells/uL (ref 1500–7800)
Neutrophils Relative %: 64 %
PLATELETS: 230 10*3/uL (ref 140–400)
RBC: 3.72 MIL/uL — ABNORMAL LOW (ref 4.20–5.80)
RDW: 13.6 % (ref 11.0–15.0)
WBC: 5.3 10*3/uL (ref 3.8–10.8)

## 2015-07-18 LAB — BASIC METABOLIC PANEL WITH GFR
BUN: 34 mg/dL — AB (ref 7–25)
CALCIUM: 9.7 mg/dL (ref 8.6–10.3)
CO2: 31 mmol/L (ref 20–31)
CREATININE: 1.3 mg/dL — AB (ref 0.70–1.11)
Chloride: 96 mmol/L — ABNORMAL LOW (ref 98–110)
GFR, Est African American: 58 mL/min — ABNORMAL LOW (ref >=60)
GFR, Est Non African American: 50 mL/min — ABNORMAL LOW (ref >=60)
GLUCOSE: 88 mg/dL (ref 65–99)
Potassium: 5 mmol/L (ref 3.5–5.3)
Sodium: 136 mmol/L (ref 135–146)

## 2015-07-18 LAB — MAGNESIUM: MAGNESIUM: 2.3 mg/dL (ref 1.5–2.5)

## 2015-07-18 LAB — TSH: TSH: 4.04 mIU/L (ref 0.40–4.50)

## 2015-07-18 NOTE — Patient Instructions (Addendum)
If your BP is staying low and having dizziness than can stop the quinapril.   Please monitor your blood pressure. If it is getting below 100/80 AND you are having fatigue with exertion, dizziness we may need to cut your blood pressure medication in half. Please call the office if this is happening. Hypotension As your heart beats, it forces blood through your body. This force is called blood pressure. If you have hypotension, you have low blood pressure. When your blood pressure is too low, you may not get enough blood to your brain. You may feel weak, feel lightheaded, have a fast heartbeat, or even pass out (faint). HOME CARE  Drink enough fluids to keep your pee (urine) clear or pale yellow.  Take all medicines as told by your doctor.  Get up slowly after sitting or lying down.  Wear support stockings as told by your doctor.  Maintain a healthy diet by including foods such as fruits, vegetables, nuts, whole grains, and lean meats. GET HELP IF:  You are throwing up (vomiting) or have watery poop (diarrhea).  You have a fever for more than 2-3 days.  You feel more thirsty than usual.  You feel weak and tired. GET HELP RIGHT AWAY IF:   You pass out (faint).  You have chest pain or a fast or irregular heartbeat.  You lose feeling in part of your body.  You cannot move your arms or legs.  You have trouble speaking.  You get sweaty or feel lightheaded. MAKE SURE YOU:   Understand these instructions.  Will watch your condition.  Will get help right away if you are not doing well or get worse. Document Released: 05/14/2009 Document Revised: 10/20/2012 Document Reviewed: 08/20/2012 [facility] Patient Information 2015 [place], [place]. This information is not intended to replace advice given to you by your health care provider. Make sure you discuss any questions you have with your health care provider.

## 2015-07-18 NOTE — Progress Notes (Signed)
Patient ID: Richard Moody, male   DOB: 02-15-1928, 82 y.o.   MRN: [id number]  MEDICARE ANNUAL WELLNESS VISIT AND OV  Assessment:   1. Essential hypertension - some hypotension but stable at this time, discussed with wife, if patient has hypotension to stop quinapril - TSH  2. Hyperlipidemia - Lipid panel  3. Prediabetes - Hemoglobin A1c - Insulin, random  4. Vitamin D deficiency - Vit D  25 hydroxy  5. ASCAD s/p MI, Stents, CABG & AICD/ Defib Control blood pressure, cholesterol, glucose, increase exercise.   6. Automatic implantable cardioverter-defibrillator in situ No shocks  7. Obstructive chronic bronchitis without exacerbation   8. Gastroesophageal reflux disease   9. Medication management - CBC with Differential/Platelet - BASIC METABOLIC PANEL WITH GFR - Hepatic function panel - Magnesium  10. Chronic systolic CHF (EF 123456) Has AICD, on amiodarone now, tolerating well  11. Neurodermatitis  12. OAB (overactive bladder)  13. Hyperkalemia (Hyporeninemic HypoAldosteronism) Monitor kidney function    Plan:   During the course of the visit the patient was educated and counseled about appropriate screening and preventive services including:    Pneumococcal vaccine   Influenza vaccine  Td vaccine  Screening electrocardiogram  Bone densitometry screening  Colorectal cancer screening  Diabetes screening  Glaucoma screening  Nutrition counseling   Advanced directives: requested  Conditions/risks identified: BMI: Discussed weight loss, diet, and increase physical activity.  Increase physical activity: AHA recommends 150 minutes of physical activity a week.  Medications reviewed PreDiabetes is at goal, ACE/ARB therapy: Yes. Urinary Incontinence is not an issue: discussed non pharmacology and pharmacology options.  Fall risk: moderate- discussed PT, home fall assessment, medications.   Subjective:    Richard Moody is a 82 y.o. MWM who  presents for Medicare Annual Wellness Visit and OV.  Date of last medicare wellness visit was 2016. This very nice 82 y.o. MWM presents for 3 month follow up with Hypertension, ASCAD Hyperlipidemia, Pre-Diabetes and Vitamin D Deficiency.   Patient is treated for HTN since 2003 & BP has been controlled at home. Today's BP: 100/66 mmHg. Patient had a MI  And angioplasty in 1989 and then in 1992 underwent CABG. In 2012 he was dx'd with CHF, EF 35-40%, and had a AICD/Defib placed. Patient has had no complaints of any cardiac type chest pain, palpitations, dyspnea/orthopnea/PND, dizziness, claudication, or dependent edema. He has had hypotension recently, has had recent addition of amiodarone x 10 days. Has follow up in 4 weeks with cardio.  On lasix 40mg  every other day, and on quinapril.   Patient also has moderate COPD with DOE and denies any significant coughing. He has been wearing compression stockings that is helping decrease swelling. Has been having home health care nurse come out for bilateral swelling and una boots, ulcers have healed.    He is not on cholesterol medication and denies myalgias. His cholesterol is at goal. The cholesterol last visit was:   Lab Results  Component Value Date   CHOL 184 04/09/2015   HDL 66 04/09/2015   LDLCALC 103 04/09/2015   TRIG 75 04/09/2015   CHOLHDL 2.8 04/09/2015    He has been working on diet and exercise for prediabetes, and denies paresthesia of the feet, polydipsia, polyuria and visual disturbances. Last A1C in the office was:  Lab Results  Component Value Date   HGBA1C 6.1* 04/09/2015   Patient is on Vitamin D supplement.   Lab Results  Component Value Date   VD25OH 71 04/09/2015  Names of Other Physician/Practitioners you currently use: 1. [facility] here for primary care 2. Dr Talbert Forest, eye doctor, last visit Feb 2017 3. Dr Baird Cancer, [place], dentist, last visit )04/2015  Patient Care Team: Unk Pinto, MD as PCP - General (Internal Medicine) Unk Pinto, MD (Internal Medicine) Darleen Crocker, MD as Consulting Physician (Ophthalmology) Deboraha Sprang, MD as Consulting Physician (Cardiology)  Medication Review:   Medication List       This list is accurate as of: 07/18/15  1:43 PM.  Always use your most recent med list.               ALPRAZolam 1 MG tablet  Commonly known as:  XANAX  Take 1/2 to 1 tablet 3 times daily as needed for anxiety and sleep.     amiodarone 200 MG tablet  Commonly known as:  PACERONE  Take 1 tablet (200 mg total) by mouth daily.     aspirin 81 MG tablet  Take 81 mg by mouth 3 (three) times a week.     CITRACAL PLUS PO  Take 1 capsule by mouth daily.     darifenacin 7.5 MG 24 hr tablet  Commonly known as:  ENABLEX  Take 7.5 mg by mouth daily.     diclofenac sodium 1 % Gel  Commonly known as:  VOLTAREN  Apply 2 g topically 4 (four) times daily.     esomeprazole 40 MG capsule  Commonly known as:  NEXIUM  Take 1 capsule by mouth daily.     fexofenadine-pseudoephedrine 60-120 MG 12 hr tablet  Commonly known as:  ALLEGRA-D  Take 1 tablet by mouth daily.     fish oil-omega-3 fatty acids 1000 MG capsule  Take 2 g by mouth daily.     furosemide 40 MG tablet  Commonly known as:  LASIX  Take one tablet (40 mg) by mouth every other day     glucosamine-chondroitin 500-400 MG tablet  Take 1 tablet by mouth 2 (two) times daily.     halobetasol 0.05 % cream  Commonly known as:  ULTRAVATE  Apply topically 2 (two) times daily.     IRON PO  Take 45 mg by mouth daily.     Magnesium 250 MG Tabs  Take 1 tablet by mouth 2 (two) times daily.     metoprolol succinate 25 MG 24 hr tablet  Commonly known as:  TOPROL-XL  TAKE 1 TABLET DAILY     NON FORMULARY  Halo beta a sol cream .05% 2-4- x per week as needed     OVER THE COUNTER MEDICATION  Take 1 tablet by mouth daily. claritin 10 mg daily for allergies PRN     polyethylene glycol  packet  Commonly known as:  MIRALAX / GLYCOLAX  Take 17 g by mouth as needed for moderate constipation.     quinapril 5 MG tablet  Commonly known as:  ACCUPRIL  Take 1 tablet (5 mg total) by mouth daily.     RED YEAST RICE PO  Take 2 capsules by mouth daily. 2 DAILY     vitamin C 500 MG tablet  Commonly known as:  ASCORBIC ACID  Take 1,000 mg by mouth daily.     Vitamin D3 5000 units Caps  Take 2 capsules by mouth daily.        Current Problems (verified) Patient Active Problem List   Diagnosis Date Noted  . Neurodermatitis 11/02/2014  . OAB (overactive bladder) 09/27/2014  .  GERD  06/20/2014  . Hyperkalemia (Hyporeninemic HypoAldosteronism) 06/20/2014  . Medication management 12/20/2013  . Hyperlipidemia 09/05/2013  . Essential hypertension 02/27/2013  . Prediabetes 02/27/2013  . Vitamin D deficiency 02/27/2013  . Obstructive chronic bronchitis without exacerbation ([place]) 02/27/2013  . Chronic systolic CHF (EF 123456) [name]  . Automatic implantable cardioverter-defibrillator in situ 04/08/2010  . ASCAD s/p CABG (1992) 09/29/2008   Screening Tests Immunization History  Administered Date(s) Administered  . Influenza, High Dose Seasonal PF 12/07/2013, 01/04/2015  . Influenza-Unspecified 01/01/2013  . Pneumococcal Polysaccharide-23 07/02/2001, 06/01/2013  . Tdap 03/04/2003   Preventative care: Last colonoscopy: 2009  Influenza 2016 Pneumonia 23 2015 TDAP 2005 Prenvar DUE Shingles vaccine declines  Risk Factors: Tobacco Social History  Substance Use Topics  . Smoking status: Former Smoker    Quit date: 03/03/1982  . Smokeless tobacco: Never Used  . Alcohol Use: No   He does not smoke.  Patient is a former smoker. Are there smokers in your home (other than you)?  No  Alcohol Current alcohol use: none  Caffeine Current caffeine use: rare  Exercise Current exercise: walking  Nutrition/Diet Current diet: in general, a "healthy" diet    Cardiac  risk factors: advanced age (older than 36 for men, 10 for women), dyslipidemia, hypertension, male gender, sedentary lifestyle and smoking/ tobacco exposure.  Depression Screen (Note: if answer to either of the following is "Yes", a more complete depression screening is indicated)   Q1: Over the past two weeks, have you felt down, depressed or hopeless? No  Q2: Over the past two weeks, have you felt little interest or pleasure in doing things? No  Have you lost interest or pleasure in daily life? No  Do you often feel hopeless? No  Do you cry easily over simple problems? No  Activities of Daily Living In your present state of health, do you have any difficulty performing the following activities?:  Driving? Yes Managing money?  Yes Feeding yourself? No Getting from bed to chair? No Climbing a flight of stairs? Yes Preparing food and eating?: Yes Bathing or showering? Yes- wife helps Getting dressed:  Yes- does slowly, wife helps Getting to the toilet? No Using the toilet:No Moving around from place to place:  Yes In the past year have you fallen or had a near fall?:No   Vision Difficulties: No  Hearing Difficulties: No Do you often ask people to speak up or repeat themselves? No Do you experience ringing or noises in your ears? No Do you have difficulty understanding soft or whispered voices? No  Cognition  Do you feel that you have a problem with memory? yes  Do you often misplace items? No  Do you feel safe at home?  Yes  Advanced directives Does patient have a [place]? Yes Does patient have a Living Will? Yes   Past Surgical History  Procedure Laterality Date  . Cardiac catheterization  10/14/2004    THERE IS POSTERIOR LATERAL HYPOKINESIA. EF 30-35%  . Coronary artery bypass graft  1992  . Knee surgery      RIGHT KNEE  . Cardiac defibrillator placement  10/2008  . Inguinal hernia repair    . Cardiac catheterization  2006    OCCLUDED SVG TO  OM  . Nuclear stress test  2010    EF 32%. No ischemia     Objective:     Blood pressure 100/66, pulse 69, temperature 97.5 F (36.4 C), temperature source Temporal, resp. rate 14, height  6' (1.829 m), weight 138 lb (62.596 kg), SpO2 96 %.  General Appearance:  Alert  WD/WN, male  in no apparent distress. Eyes: PERRLA, EOMs nl, conjunctiva normal, normal fundi and vessels. Sinuses: No frontal/maxillary tenderness ENT/Mouth: EACs patent / TMs  nl. Nares clear without erythema, swelling, mucoid exudates. Oral hygiene is good. No erythema, swelling, or exudate. Tongue normal, non-obstructing. Tonsils not swollen or erythematous. Hearing normal.  Neck: Supple, thyroid normal. No bruits, nodes or JVD. Respiratory: Respiratory effort normal.  BS equal and decreased  bilateral without rales, rhonci, wheezing or stridor. Cardio: Heart sounds are normal with regular rate and rhythm and no murmurs, rubs or gallops. Peripheral pulses are normal and equal bilaterally without edema. No aortic or femoral bruits. Chest: Barrel configured with median sternotomy scar.  Abdomen: Flat, soft, with nl bowel sounds. Nontender, no guarding, rebound, hernias, masses, or organomegaly.  Lymphatics: Non tender without lymphadenopathy.  Musculoskeletal: Full ROM all peripheral extremities, joint stability, 4/5 strength, and  Antalgic gait with cane.  Skin: Warm and dry without rashes, lesions, cyanosis, clubbing or  ecchymosis.  Neuro: Cranial nerves intact, reflexes equal bilaterally. Normal muscle tone, no cerebellar symptoms.  Pysch: Alert and oriented X 3 with normal affect, insight and judgment appropriate.   Cognitive Testing  Alert? Yes  Normal Appearance? Yes  Oriented to person? Yes  Place? Yes   Time? Yes  Recall of three objects?  No 2/3  Can perform simple calculations? Yes  Displays appropriate judgment? Yes  Can read the correct time from a watch/clock? Yes  Medicare Attestation I have  personally reviewed: The patient's medical and social history Their use of alcohol, tobacco or illicit drugs Their current medications and supplements The patient's functional ability including ADLs,fall risks, home safety risks, cognitive, and hearing and visual impairment Diet and physical activities Evidence for depression or mood disorders  The patient's weight, height, BMI, and visual acuity have been recorded in the chart.  I have made referrals, counseling, and provided education to the patient based on review of the above and I have provided the patient with a written personalized care plan for preventive services.  Over 40 minutes of exam, counseling, chart review was performed.  Vicie Mutters, PA-C   07/18/2015

## 2015-07-19 LAB — HEMOGLOBIN A1C
Hgb A1c MFr Bld: 5.7 % — ABNORMAL HIGH (ref <5.7)
MEAN PLASMA GLUCOSE: 117 mg/dL

## 2015-07-20 DIAGNOSIS — I872 Venous insufficiency (chronic) (peripheral): Secondary | ICD-10-CM | POA: Diagnosis not present

## 2015-07-20 DIAGNOSIS — J449 Chronic obstructive pulmonary disease, unspecified: Secondary | ICD-10-CM | POA: Diagnosis not present

## 2015-07-20 DIAGNOSIS — I255 Ischemic cardiomyopathy: Secondary | ICD-10-CM | POA: Diagnosis not present

## 2015-07-20 DIAGNOSIS — I110 Hypertensive heart disease with heart failure: Secondary | ICD-10-CM | POA: Diagnosis not present

## 2015-07-20 DIAGNOSIS — I493 Ventricular premature depolarization: Secondary | ICD-10-CM | POA: Diagnosis not present

## 2015-07-20 DIAGNOSIS — I5022 Chronic systolic (congestive) heart failure: Secondary | ICD-10-CM | POA: Diagnosis not present

## 2015-07-24 ENCOUNTER — Emergency Department (HOSPITAL_COMMUNITY): Payer: Medicare Other

## 2015-07-24 ENCOUNTER — Encounter (HOSPITAL_COMMUNITY): Payer: Self-pay | Admitting: Adult Health

## 2015-07-24 ENCOUNTER — Inpatient Hospital Stay (HOSPITAL_COMMUNITY)
Admission: EM | Admit: 2015-07-24 | Discharge: 2015-07-29 | DRG: 683 | Disposition: A | Discharge status: Skilled Nursing Facility | Payer: Medicare Other | Attending: Family Medicine | Admitting: Family Medicine

## 2015-07-24 ENCOUNTER — Telehealth: Payer: Self-pay | Admitting: Internal Medicine

## 2015-07-24 DIAGNOSIS — I5042 Chronic combined systolic (congestive) and diastolic (congestive) heart failure: Secondary | ICD-10-CM | POA: Diagnosis present

## 2015-07-24 DIAGNOSIS — D6489 Other specified anemias: Secondary | ICD-10-CM | POA: Diagnosis present

## 2015-07-24 DIAGNOSIS — R531 Weakness: Secondary | ICD-10-CM | POA: Diagnosis not present

## 2015-07-24 DIAGNOSIS — S22000A Wedge compression fracture of unspecified thoracic vertebra, initial encounter for closed fracture: Secondary | ICD-10-CM | POA: Diagnosis not present

## 2015-07-24 DIAGNOSIS — R5383 Other fatigue: Secondary | ICD-10-CM | POA: Diagnosis not present

## 2015-07-24 DIAGNOSIS — S0990XA Unspecified injury of head, initial encounter: Secondary | ICD-10-CM | POA: Diagnosis not present

## 2015-07-24 DIAGNOSIS — R7303 Prediabetes: Secondary | ICD-10-CM | POA: Diagnosis present

## 2015-07-24 DIAGNOSIS — M179 Osteoarthritis of knee, unspecified: Secondary | ICD-10-CM | POA: Diagnosis present

## 2015-07-24 DIAGNOSIS — N390 Urinary tract infection, site not specified: Secondary | ICD-10-CM | POA: Diagnosis present

## 2015-07-24 DIAGNOSIS — I252 Old myocardial infarction: Secondary | ICD-10-CM

## 2015-07-24 DIAGNOSIS — E860 Dehydration: Secondary | ICD-10-CM | POA: Diagnosis not present

## 2015-07-24 DIAGNOSIS — R296 Repeated falls: Secondary | ICD-10-CM | POA: Diagnosis present

## 2015-07-24 DIAGNOSIS — I110 Hypertensive heart disease with heart failure: Secondary | ICD-10-CM | POA: Diagnosis present

## 2015-07-24 DIAGNOSIS — Z7409 Other reduced mobility: Secondary | ICD-10-CM | POA: Diagnosis present

## 2015-07-24 DIAGNOSIS — W19XXXA Unspecified fall, initial encounter: Secondary | ICD-10-CM | POA: Diagnosis not present

## 2015-07-24 DIAGNOSIS — Z7982 Long term (current) use of aspirin: Secondary | ICD-10-CM

## 2015-07-24 DIAGNOSIS — R001 Bradycardia, unspecified: Secondary | ICD-10-CM | POA: Diagnosis not present

## 2015-07-24 DIAGNOSIS — N179 Acute kidney failure, unspecified: Principal | ICD-10-CM | POA: Diagnosis present

## 2015-07-24 DIAGNOSIS — Z95810 Presence of automatic (implantable) cardiac defibrillator: Secondary | ICD-10-CM

## 2015-07-24 DIAGNOSIS — E785 Hyperlipidemia, unspecified: Secondary | ICD-10-CM | POA: Diagnosis present

## 2015-07-24 DIAGNOSIS — I10 Essential (primary) hypertension: Secondary | ICD-10-CM | POA: Diagnosis present

## 2015-07-24 DIAGNOSIS — Z951 Presence of aortocoronary bypass graft: Secondary | ICD-10-CM

## 2015-07-24 DIAGNOSIS — K219 Gastro-esophageal reflux disease without esophagitis: Secondary | ICD-10-CM | POA: Diagnosis present

## 2015-07-24 DIAGNOSIS — I2510 Atherosclerotic heart disease of native coronary artery without angina pectoris: Secondary | ICD-10-CM | POA: Diagnosis present

## 2015-07-24 DIAGNOSIS — I255 Ischemic cardiomyopathy: Secondary | ICD-10-CM | POA: Diagnosis present

## 2015-07-24 LAB — COMPREHENSIVE METABOLIC PANEL
ALK PHOS: 80 U/L (ref 38–126)
ALT: 17 U/L (ref 17–63)
AST: 31 U/L (ref 15–41)
Albumin: 3.3 g/dL — ABNORMAL LOW (ref 3.5–5.0)
Anion gap: 10 (ref 5–15)
BILIRUBIN TOTAL: 0.9 mg/dL (ref 0.3–1.2)
BUN: 50 mg/dL — AB (ref 6–20)
CALCIUM: 9.2 mg/dL (ref 8.9–10.3)
CHLORIDE: 102 mmol/L (ref 101–111)
CO2: 28 mmol/L (ref 22–32)
CREATININE: 1.64 mg/dL — AB (ref 0.61–1.24)
GFR, EST AFRICAN AMERICAN: 42 mL/min — AB (ref >60)
GFR, EST NON AFRICAN AMERICAN: 37 mL/min — AB (ref >60)
Glucose, Bld: 108 mg/dL — ABNORMAL HIGH (ref 65–99)
Potassium: 4.6 mmol/L (ref 3.5–5.1)
Sodium: 140 mmol/L (ref 135–145)
TOTAL PROTEIN: 6.5 g/dL (ref 6.5–8.1)

## 2015-07-24 LAB — URINE MICROSCOPIC-ADD ON: Squamous Epithelial / LPF: NONE SEEN

## 2015-07-24 LAB — CBC
HEMATOCRIT: 33.2 % — AB (ref 39.0–52.0)
Hemoglobin: 10.7 g/dL — ABNORMAL LOW (ref 13.0–17.0)
MCH: 30.1 pg (ref 26.0–34.0)
MCHC: 32.2 g/dL (ref 30.0–36.0)
MCV: 93.5 fL (ref 78.0–100.0)
PLATELETS: 206 10*3/uL (ref 150–400)
RBC: 3.55 MIL/uL — AB (ref 4.22–5.81)
RDW: 13.5 % (ref 11.5–15.5)
WBC: 5.7 10*3/uL (ref 4.0–10.5)

## 2015-07-24 LAB — URINALYSIS, ROUTINE W REFLEX MICROSCOPIC
BILIRUBIN URINE: NEGATIVE
Glucose, UA: NEGATIVE mg/dL
KETONES UR: NEGATIVE mg/dL
NITRITE: POSITIVE — AB
Protein, ur: NEGATIVE mg/dL
Specific Gravity, Urine: 1.014 (ref 1.005–1.030)
pH: 6.5 (ref 5.0–8.0)

## 2015-07-24 LAB — CK: Total CK: 650 U/L — ABNORMAL HIGH (ref 49–397)

## 2015-07-24 LAB — I-STAT TROPONIN, ED: Troponin i, poc: 0.02 ng/mL (ref 0.00–0.08)

## 2015-07-24 MED ORDER — AMIODARONE HCL 200 MG PO TABS
200.0000 mg | ORAL_TABLET | Freq: Every day | ORAL | Status: DC
  Administered 2015-07-24 – 2015-07-28 (×4): 200 mg via ORAL
  Filled 2015-07-24 (×5): qty 1

## 2015-07-24 MED ORDER — POLYETHYLENE GLYCOL 3350 17 G PO PACK
17.0000 g | PACK | Freq: Every day | ORAL | Status: DC | PRN
  Administered 2015-07-29: 17 g via ORAL
  Filled 2015-07-24: qty 1

## 2015-07-24 MED ORDER — SODIUM CHLORIDE 0.9% FLUSH
3.0000 mL | Freq: Two times a day (BID) | INTRAVENOUS | Status: DC
  Administered 2015-07-25 – 2015-07-29 (×8): 3 mL via INTRAVENOUS

## 2015-07-24 MED ORDER — SODIUM CHLORIDE 0.9 % IV SOLN
INTRAVENOUS | Status: DC
  Administered 2015-07-24: 23:00:00 via INTRAVENOUS

## 2015-07-24 MED ORDER — SODIUM CHLORIDE 0.9 % IV BOLUS (SEPSIS)
250.0000 mL | Freq: Once | INTRAVENOUS | Status: AC
  Administered 2015-07-24: 250 mL via INTRAVENOUS

## 2015-07-24 MED ORDER — CLOBETASOL PROPIONATE 0.05 % EX CREA
TOPICAL_CREAM | Freq: Two times a day (BID) | CUTANEOUS | Status: DC
  Filled 2015-07-24: qty 15

## 2015-07-24 MED ORDER — VITAMIN C 500 MG PO TABS
1000.0000 mg | ORAL_TABLET | Freq: Every day | ORAL | Status: DC
  Administered 2015-07-25 – 2015-07-29 (×5): 1000 mg via ORAL
  Filled 2015-07-24 (×5): qty 2

## 2015-07-24 MED ORDER — ALPRAZOLAM 0.25 MG PO TABS
0.5000 mg | ORAL_TABLET | Freq: Three times a day (TID) | ORAL | Status: DC | PRN
  Administered 2015-07-27 (×2): 0.5 mg via ORAL
  Administered 2015-07-28: 1 mg via ORAL
  Filled 2015-07-24: qty 4
  Filled 2015-07-24 (×2): qty 2

## 2015-07-24 MED ORDER — ADULT MULTIVITAMIN W/MINERALS CH
1.0000 | ORAL_TABLET | Freq: Every day | ORAL | Status: DC
  Administered 2015-07-25 – 2015-07-29 (×5): 1 via ORAL
  Filled 2015-07-24 (×5): qty 1

## 2015-07-24 MED ORDER — ACETAMINOPHEN 650 MG RE SUPP: 650.0000 mg | Freq: Four times a day (QID) | RECTAL | Status: DC | PRN

## 2015-07-24 MED ORDER — ENOXAPARIN SODIUM 30 MG/0.3ML ~~LOC~~ SOLN
30.0000 mg | SUBCUTANEOUS | Status: DC
  Administered 2015-07-25 – 2015-07-29 (×5): 30 mg via SUBCUTANEOUS
  Filled 2015-07-24 (×5): qty 0.3

## 2015-07-24 MED ORDER — ONDANSETRON HCL 4 MG PO TABS: 4.0000 mg | ORAL_TABLET | Freq: Four times a day (QID) | ORAL | Status: DC | PRN

## 2015-07-24 MED ORDER — DICLOFENAC SODIUM 1 % TD GEL: 2.0000 g | Freq: Four times a day (QID) | TRANSDERMAL | Status: DC | PRN

## 2015-07-24 MED ORDER — POLYETHYLENE GLYCOL 3350 17 G PO PACK: 17.0000 g | PACK | ORAL | Status: DC | PRN

## 2015-07-24 MED ORDER — FERROUS SULFATE 325 (65 FE) MG PO TABS
325.0000 mg | ORAL_TABLET | Freq: Every day | ORAL | Status: DC
  Administered 2015-07-25 – 2015-07-29 (×5): 325 mg via ORAL
  Filled 2015-07-24 (×5): qty 1

## 2015-07-24 MED ORDER — ONDANSETRON HCL 4 MG/2ML IJ SOLN: 4.0000 mg | Freq: Four times a day (QID) | INTRAMUSCULAR | Status: DC | PRN

## 2015-07-24 MED ORDER — METOPROLOL SUCCINATE ER 25 MG PO TB24
25.0000 mg | ORAL_TABLET | Freq: Every day | ORAL | Status: DC
  Administered 2015-07-25 – 2015-07-28 (×4): 25 mg via ORAL
  Filled 2015-07-24 (×5): qty 1

## 2015-07-24 MED ORDER — ASPIRIN EC 81 MG PO TBEC
81.0000 mg | DELAYED_RELEASE_TABLET | ORAL | Status: DC
  Administered 2015-07-25 – 2015-07-27 (×2): 81 mg via ORAL
  Filled 2015-07-24 (×2): qty 1

## 2015-07-24 MED ORDER — DEXTROSE 5 % IV SOLN
1.0000 g | Freq: Once | INTRAVENOUS | Status: AC
  Administered 2015-07-24: 1 g via INTRAVENOUS
  Filled 2015-07-24: qty 10

## 2015-07-24 MED ORDER — PANTOPRAZOLE SODIUM 40 MG PO TBEC
40.0000 mg | DELAYED_RELEASE_TABLET | Freq: Every day | ORAL | Status: DC
  Administered 2015-07-25 – 2015-07-29 (×5): 40 mg via ORAL
  Filled 2015-07-24 (×5): qty 1

## 2015-07-24 MED ORDER — GUAIFENESIN ER 600 MG PO TB12
600.0000 mg | ORAL_TABLET | Freq: Two times a day (BID) | ORAL | Status: DC
  Administered 2015-07-24 – 2015-07-29 (×8): 600 mg via ORAL
  Filled 2015-07-24 (×8): qty 1

## 2015-07-24 MED ORDER — LORATADINE 10 MG PO TABS: 10.0000 mg | ORAL_TABLET | Freq: Every day | ORAL | Status: DC | PRN

## 2015-07-24 MED ORDER — ACETAMINOPHEN 325 MG PO TABS
650.0000 mg | ORAL_TABLET | Freq: Four times a day (QID) | ORAL | Status: DC | PRN
  Administered 2015-07-28: 650 mg via ORAL
  Filled 2015-07-24: qty 2

## 2015-07-24 MED ORDER — DARIFENACIN HYDROBROMIDE ER 7.5 MG PO TB24
7.5000 mg | ORAL_TABLET | Freq: Every day | ORAL | Status: DC
  Administered 2015-07-25 – 2015-07-29 (×5): 7.5 mg via ORAL
  Filled 2015-07-24 (×5): qty 1

## 2015-07-24 MED ORDER — MAGNESIUM OXIDE 400 (241.3 MG) MG PO TABS
400.0000 mg | ORAL_TABLET | Freq: Two times a day (BID) | ORAL | Status: DC
  Administered 2015-07-24 – 2015-07-29 (×8): 400 mg via ORAL
  Filled 2015-07-24 (×8): qty 1

## 2015-07-24 MED ORDER — GLUCOSAMINE-CHONDROITIN 500-400 MG PO TABS: 1.0000 | ORAL_TABLET | Freq: Two times a day (BID) | ORAL | Status: DC

## 2015-07-24 MED ORDER — OMEGA-3-ACID ETHYL ESTERS 1 G PO CAPS
1.0000 g | ORAL_CAPSULE | Freq: Every day | ORAL | Status: DC
  Administered 2015-07-25 – 2015-07-29 (×5): 1 g via ORAL
  Filled 2015-07-24 (×5): qty 1

## 2015-07-24 MED ORDER — ALBUTEROL SULFATE (2.5 MG/3ML) 0.083% IN NEBU: 2.5000 mg | INHALATION_SOLUTION | RESPIRATORY_TRACT | Status: DC | PRN

## 2015-07-24 NOTE — ED Notes (Signed)
Attempted report 

## 2015-07-24 NOTE — ED Notes (Signed)
Mitch from [employer] called regarding defib/pacemaker interrogation, no arrhythmias or shocks noted since last check in May.

## 2015-07-24 NOTE — H&P (Addendum)
History and Physical    Richard Moody [id number] DOB: 05/28/1927 DOA: 07/24/2015  Referring MD/NP/PA: Dr. Canary Brim PCP: Alesia Richards, MD  Patient coming from: home  Chief Complaint: Found down  HPI: Richard Moody is a 82 y.o. male with medical history significant of ischemic cardiomyopathy with combined CHF last EF noted to be 35-40% in [name] diastolic dysfunction, s/p AICD, CAD, HLD, GERD; who presents after being found down. The patient's wife had been out of town for graduations and he had been being watched by family members close by. History obtained by patient and family members. He had been noted to have fallen twice over the last week and family members had to help him up. He had initially fallen 2 days ago and  then this morning around 2:30 AM. Family noted that he had been acting more confused than normal. At baseline patient able to ambulate with assistance of a cane or just by holding on to an nearby objects. He reports recalling tripping over some object in the room and falling forward. Only recent change medication wise was the patient was started on amiodarone approximately 2 weeks ago by his cardiologist Dr Caryl Comes. Patient complains of decreased urine output.  Denies any loss of consciousness, head trauma, dysuria, urinary frequency, hematuria, shortness of breath, chest pain, palpitations, changes in vision, weight gain, or focal weakness.   ED Course: Upon admission into the emergency department patient was noted to have heart rate as low as 32, respiratory rate up to 23, and all other vital signs within normal limits in triage. His [employer] defibrillator/pacemaker was interrogated and there is no note of any arrhythmias or shocks given since May. Lab work revealed a WBC of 4.2, hemoglobin 10.7, platelets 206, BUN 50, creatinine 1.64, and creatinine kinase of 650. Urinalysis was positive for a few bacteria, moderate leukocytes, positive nitrites, no squamous  epithelial cells, and 6-30 WBCs. CT of the brain show no acute abnormalities and chest x-ray showed chronic compression fracture of the thoracic spine. Patient was started on Rocephin and given two 250 mL boluses of IV fluid in the ED. [facility] called to admit.   Review of Systems: As per HPI otherwise 10 point review of systems negative.   Past Medical History  Diagnosis Date  . Ischemic cardiomyopathy     EF 32%  . OA (osteoarthritis) of knee   . Ganglion cyst of wrist     LEFT WRIST  . MI, old     INFERIOR LATERAL  . Hyperlipidemia   . GERD (gastroesophageal reflux disease)   . ICD (implantable cardiac defibrillator) in place August 2010  . Hx of CABG 1992  . S/P cardiac cath 2006    Occluded SVG to OM. Managed medically  . Normal nuclear stress test June 2010    No ischemia. EF 32%  . Vitamin D deficiency   . Pre-diabetes     Past Surgical History  Procedure Laterality Date  . Cardiac catheterization  10/14/2004    THERE IS POSTERIOR LATERAL HYPOKINESIA. EF 30-35%  . Coronary artery bypass graft  1992  . Knee surgery      RIGHT KNEE  . Cardiac defibrillator placement  10/2008  . Inguinal hernia repair    . Cardiac catheterization  2006    OCCLUDED SVG TO OM  . Nuclear stress test  2010    EF 32%. No ischemia     reports that he quit smoking about 33 years ago. He has never used  smokeless tobacco. He reports that he does not drink alcohol or use illicit drugs.  Allergies  Allergen Reactions  . Lipitor [Atorvastatin]     Elevated cpk   . Mobic [Meloxicam] Other (See Comments)    edema  . Nsaids Other (See Comments)    dyspepsia    Family History  Problem Relation Age of Onset  . Cancer Mother     ABDOMINAL CANCER  . Hypertension Father   . Stroke Father   . Breast cancer Sister     Prior to Admission medications   Medication Sig Start Date End Date Taking? Authorizing Provider  ALPRAZolam Duanne Moron) 1 MG tablet Take 1/2 to 1 tablet 3 times daily as needed for  anxiety and sleep. 07/16/15   Unk Pinto, MD  amiodarone (PACERONE) 200 MG tablet Take 1 tablet (200 mg total) by mouth daily. 07/09/15   Amber Sena Slate, NP  aspirin 81 MG tablet Take 81 mg by mouth 3 (three) times a week.      Historical Provider, MD  Cholecalciferol (VITAMIN D3) 5000 UNITS CAPS Take 2 capsules by mouth daily.      Historical Provider, MD  darifenacin (ENABLEX) 7.5 MG 24 hr tablet Take 7.5 mg by mouth daily.    Historical Provider, MD  diclofenac sodium (VOLTAREN) 1 % GEL Apply 2 g topically 4 (four) times daily. 01/04/15   Courtney Forcucci, PA-C  esomeprazole (NEXIUM) 40 MG capsule Take 1 capsule by mouth daily. 05/09/15   Historical Provider, MD  fexofenadine-pseudoephedrine (ALLEGRA-D) 60-120 MG per tablet Take 1 tablet by mouth daily.  03/06/09   Historical Provider, MD  fish oil-omega-3 fatty acids 1000 MG capsule Take 2 g by mouth daily.      Historical Provider, MD  furosemide (LASIX) 40 MG tablet Take one tablet (40 mg) by mouth every other day 06/12/15   Deboraha Sprang, MD  glucosamine-chondroitin 500-400 MG tablet Take 1 tablet by mouth 2 (two) times daily.      Historical Provider, MD  halobetasol (ULTRAVATE) 0.05 % cream Apply topically 2 (two) times daily. 04/30/15   Unk Pinto, MD  IRON PO Take 45 mg by mouth daily.     Historical Provider, MD  Magnesium 250 MG TABS Take 1 tablet by mouth 2 (two) times daily.     Historical Provider, MD  metoprolol succinate (TOPROL-XL) 25 MG 24 hr tablet TAKE 1 TABLET DAILY 07/11/15   Unk Pinto, MD  Multiple Minerals-Vitamins (CITRACAL PLUS PO) Take 1 capsule by mouth daily.     Historical Provider, MD  NON FORMULARY Halo beta a sol cream .05% 2-4- x per week as needed     Historical Provider, MD  OVER THE COUNTER MEDICATION Take 1 tablet by mouth daily. claritin 10 mg daily for allergies PRN    Historical Provider, MD  polyethylene glycol (MIRALAX / GLYCOLAX) packet Take 17 g by mouth as needed for moderate constipation.      Historical Provider, MD  quinapril (ACCUPRIL) 5 MG tablet Take 1 tablet (5 mg total) by mouth daily. 05/18/15 05/17/16  Deboraha Sprang, MD  Red Yeast Rice Extract (RED YEAST RICE PO) Take 2 capsules by mouth daily. 2 DAILY    Historical Provider, MD  vitamin C (ASCORBIC ACID) 500 MG tablet Take 1,000 mg by mouth daily.      Historical Provider, MD    Physical Exam: Filed Vitals:   07/24/15 1830 07/24/15 1930 07/24/15 2000 07/24/15 2030  BP: 120/63 132/67 122/64 102/64  Pulse: 60 63 60 59  Temp:      TempSrc:      Resp: 13 23 13 18   SpO2: 100% 100% 100% 100%      Constitutional: Thin frail elderly male in no acute distress Filed Vitals:   07/24/15 1830 07/24/15 1930 07/24/15 2000 07/24/15 2030  BP: 120/63 132/67 122/64 102/64  Pulse: 60 63 60 59  Temp:      TempSrc:      Resp: 13 23 13 18   SpO2: 100% 100% 100% 100%   Eyes: PERRL, lids and conjunctivae normal ENMT: Mucous membranes are Dry. Posterior pharynx clear of any exudate or lesions.Normal dentition.  Neck: normal, supple, no masses, no thyromegaly Respiratory: clear to auscultation bilaterally, no wheezing, no crackles. Normal respiratory effort. No accessory muscle use.  Cardiovascular: Regular Irregular rhythm with trace bilateral lower extremity edema Abdomen: no tenderness, no masses palpated. No hepatosplenomegaly. Bowel sounds positive. No CVA tenderness noted. Musculoskeletal: no clubbing / cyanosis. No joint deformity upper and lower extremities. Good ROM, no contractures. Decreased overall muscle tone noted. Skin: no rashes, lesions, ulcers. No induration Neurologic: CN 2-12 grossly intact. Sensation intact, DTR normal. Strength 4/5 in all 4 extremities.  Psychiatric: Normal judgment and insight. Alert and oriented x 3. Normal mood.     Labs on Admission: I have personally reviewed following labs and imaging studies  CBC:  Recent Labs Lab 07/18/15 1217 07/24/15 1551  WBC 5.3 5.7  NEUTROABS 3392  --     HGB 11.5* 10.7*  HCT 34.6* 33.2*  MCV 93.0 93.5  PLT 230 [id number]   Basic Metabolic Panel:  Recent Labs Lab 07/18/15 1217 07/24/15 1551  NA 136 140  K 5.0 4.6  CL 96* 102  CO2 31 28  GLUCOSE 88 108*  BUN 34* 50*  CREATININE 1.30* 1.64*  CALCIUM 9.7 9.2  MG 2.3  --    GFR: Estimated Creatinine Clearance: 29.2 mL/min (by C-G formula based on Cr of 1.64). Liver Function Tests:  Recent Labs Lab 07/18/15 1217 07/24/15 1551  AST 16 31  ALT 12 17  ALKPHOS 100 80  BILITOT 0.5 0.9  PROT 6.9 6.5  ALBUMIN 4.1 3.3*   No results for input(s): LIPASE, AMYLASE in the last 168 hours. No results for input(s): AMMONIA in the last 168 hours. Coagulation Profile: No results for input(s): INR, PROTIME in the last 168 hours. Cardiac Enzymes:  Recent Labs Lab 07/24/15 1551  CKTOTAL 650*   BNP (last 3 results) No results for input(s): PROBNP in the last 8760 hours. HbA1C: No results for input(s): HGBA1C in the last 72 hours. CBG: No results for input(s): GLUCAP in the last 168 hours. Lipid Profile: No results for input(s): CHOL, HDL, LDLCALC, TRIG, CHOLHDL, LDLDIRECT in the last 72 hours. Thyroid Function Tests: No results for input(s): TSH, T4TOTAL, FREET4, T3FREE, THYROIDAB in the last 72 hours. Anemia Panel: No results for input(s): VITAMINB12, FOLATE, FERRITIN, TIBC, IRON, RETICCTPCT in the last 72 hours. Urine analysis:    Component Value Date/Time   COLORURINE YELLOW 07/24/2015 1954   APPEARANCEUR CLOUDY* 07/24/2015 1954   LABSPEC 1.014 07/24/2015 1954   PHURINE 6.5 07/24/2015 1954   GLUCOSEU NEGATIVE 07/24/2015 1954   HGBUR TRACE* 07/24/2015 [place] NEGATIVE 07/24/2015 1954   KETONESUR NEGATIVE 07/24/2015 1954   PROTEINUR NEGATIVE 07/24/2015 1954   UROBILINOGEN 0.2 07/05/2013 1515   NITRITE POSITIVE* 07/24/2015 1954   LEUKOCYTESUR MODERATE* 07/24/2015 1954   Sepsis Labs: No results found for this  or any previous visit (from the past 240 hour(s)).    Radiological Exams on Admission: Dg Chest 2 View  07/24/2015  CLINICAL DATA:  Altered mental status and generalize weakness with fatigue. Found on floor after 24 hours. EXAM: CHEST  2 VIEW COMPARISON:  09/01/2014 FINDINGS: Sternotomy wire and left-sided pacemaker unchanged. Diffuse decreased bone mineralization is present. Lungs are adequately inflated without focal consolidation or effusion. Cardiomediastinal silhouette is within normal. There is a moderate size hiatal hernia present. There are degenerative changes of the spine with stable moderate compression fracture in the region of the thoracolumbar junction as well as mild loss of height of several additional thoracic vertebral bodies unchanged. IMPRESSION: No acute cardiopulmonary disease. Stable compression fracture at the thoracolumbar junction and stable loss of height of several additional thoracic vertebrae. Electronically Signed   By: Marin Olp M.D.   On: 07/24/2015 17:19   Ct Head Wo Contrast  07/24/2015  CLINICAL DATA:  Fall this morning. Generalized weakness. No loss of consciousness. Initial encounter. EXAM: CT HEAD WITHOUT CONTRAST TECHNIQUE: Contiguous axial images were obtained from the base of the skull through the vertex without intravenous contrast. COMPARISON:  10/30/2006 FINDINGS: Mild generalized cerebral atrophy is unchanged. Subcortical and periventricular cerebral white matter hypodensities are similar to the prior study and nonspecific but compatible with mild chronic small vessel ischemic disease. There is no evidence of acute cortical infarct, intracranial hemorrhage, mass, midline shift, or extra-axial fluid collection. Prior bilateral cataract extraction is noted. There is evidence of chronic bilateral maxillary sinusitis with increased, near complete opacification on the left. Mastoid air cells are clear. Calcified atherosclerosis is noted at the skullbase. No skull fracture is identified. IMPRESSION: 1. No evidence of  acute intracranial abnormality. 2. Mild chronic small vessel ischemic disease and cerebral atrophy. Electronically Signed   By: Logan Bores M.D.   On: 07/24/2015 16:52    EKG: Independently reviewed. Bigeminy with prolonged PR interval of 277.   Assessment/Plan Falls:  Acute. Suspect secondary to urinary tract infection and dehydration. - Admit to telemetry  - Fall precautions  - Physical therapy to eval and treat  - Care management consult for home health/equipment needs  Urinary tract infection: Acute. Patient reports decreased urine output. UA found to be positive. Given Rocephin in the ED. - Follow-up urine culture - Empiric antibiotics of Rocephin, de-escalate antibiotics when able  Acute kidney injury secondary to dehydration: Patient's baseline creatinine appears to have been within normal limits previously, but most recent check in 5/17 showed a creatinine of 1.3. Acutely elevated on presentation to 1.64 with a BUN of 50 suggesting a prerenal state.  - Gentle IV fluids of normal saline at 75 ml/hr - Hold nephrotoxic agents   EKG abnormality( Bigeminy /Reported Bradycardia): Acute. Heart rates noted to be as low as 32 on admission. - Follow-up telemetry overnight   Ischemic cardiomyopathy with combined CHF s/p AICD/PM: last EF noted to be 35-40%  With grade 1 diastolic dysfunction. The device interrogated and revealed no recent arrhythmias. - Strict I&Os and Daily weights - Continue amiodarone and  Metoprolol  Essential hypertension - held Accupril and furosemide secondary to acute kidney injury   Anemia: Acute on Chronic. Hemoglobin noted to be 11.5 on 5/17 , but acutely noted to be 10.7. - monitor closely and recheck CBC in a.m.  DVT prophylaxis: lovenox Code Status:  Full  Family Communication: Discussed plan with wife and family members at bedside  Disposition Plan: Possible discharge home in 1-2 days Consults  called: None Admission status: Telemetry  observation  Norval Morton MD [facility] Pager [phone]  If 7PM-7AM, please contact night-coverage [email or web address] Password [facility]  07/24/2015, 9:19 PM

## 2015-07-24 NOTE — ED Notes (Signed)
PEr family-he was home alone for a few days and while speaking with him on phone he would go in and out of conversation. Per family they believe he was on the floor for 24 hours. He c/o generalized weakness and fatigue. intial HR in triage 32-pt is pale. He has a Water quality scientist.

## 2015-07-24 NOTE — Telephone Encounter (Signed)
I spoke with the patient's stepson, Tim. He reports that he just went to check on the patient, and found him conscious on the floor at home, but he could not get up.  Per the patient's report, he feel about 2 am. He is currently coherent and denies any other symptoms. Per Octavia Bruckner, this is the 2nd time this week that this has happened. The patient reports that he tripped last night, but the night before last, he was in a chair and leaned forward and fell out. I advised Tim to take the patient to the ER for further evaluation. He is agreeable. The patient is currently without symptoms and feels he can make it to the car. Octavia Bruckner will take him by private vehicle to [facility]. I have spoken with the triage nurse in the ER and she is aware. [name], NP also notified.

## 2015-07-24 NOTE — ED Provider Notes (Signed)
CSN: [id number]     Arrival date & time 07/24/15  1514 History   First MD Initiated Contact with Patient 07/24/15 1528     Chief Complaint  Patient presents with  . Fatigue     (Consider location/radiation/quality/duration/timing/severity/associated sxs/prior Treatment) HPI  Pt presenting with c/o increased weakness, fall.  He was found on the floor of his bedroom by his son- he states he fell approx 2am and was not able to get up.  He states he did not faint, but is unsure about the details of why he fell.  Family notes his wife has been out of town and patient has been alone this week.  This was the second fall this week where he was found after 12 hours.  They have also noted him to be more weak and confused.  Pt denies pain.  Denies chest pain, headache.  Denies difficulty breathing.  Denies palpitations.  No vomiting.  No neck or back pain. Of note, patient was started on amiodarone 2 weeks ago by cardiology.   There are no other associated systemic symptoms, there are no other alleviating or modifying factors.   Past Medical History  Diagnosis Date  . Ischemic cardiomyopathy     EF 32%  . OA (osteoarthritis) of knee   . Ganglion cyst of wrist     LEFT WRIST  . MI, old     INFERIOR LATERAL  . Hyperlipidemia   . GERD (gastroesophageal reflux disease)   . ICD (implantable cardiac defibrillator) in place August 2010  . Hx of CABG 1992  . S/P cardiac cath 2006    Occluded SVG to OM. Managed medically  . Normal nuclear stress test June 2010    No ischemia. EF 32%  . Vitamin D deficiency   . Pre-diabetes    Past Surgical History  Procedure Laterality Date  . Cardiac catheterization  10/14/2004    THERE IS POSTERIOR LATERAL HYPOKINESIA. EF 30-35%  . Coronary artery bypass graft  1992  . Knee surgery      RIGHT KNEE  . Cardiac defibrillator placement  10/2008  . Inguinal hernia repair    . Cardiac catheterization  2006    OCCLUDED SVG TO OM  . Nuclear stress test  2010    EF  32%. No ischemia   Family History  Problem Relation Age of Onset  . Cancer Mother     ABDOMINAL CANCER  . Hypertension Father   . Stroke Father   . Breast cancer Sister    Social History  Substance Use Topics  . Smoking status: Former Smoker    Quit date: 03/03/1982  . Smokeless tobacco: Never Used  . Alcohol Use: No    Review of Systems  ROS reviewed and all otherwise negative except for mentioned in HPI    Allergies  Lipitor; Mobic; and Nsaids  Home Medications   Prior to Admission medications   Medication Sig Start Date End Date Taking? Authorizing Provider  ALPRAZolam Duanne Moron) 1 MG tablet Take 1/2 to 1 tablet 3 times daily as needed for anxiety and sleep. 07/16/15  Yes Unk Pinto, MD  amiodarone (PACERONE) 200 MG tablet Take 1 tablet (200 mg total) by mouth daily. 07/09/15  Yes Amber Sena Slate, NP  aspirin 81 MG tablet Take 81 mg by mouth 3 (three) times a week.     Yes Historical Provider, MD  Cholecalciferol (VITAMIN D3) 5000 UNITS CAPS Take 2 capsules by mouth daily.     Yes Historical Provider,  MD  darifenacin (ENABLEX) 7.5 MG 24 hr tablet Take 7.5 mg by mouth daily.   Yes Historical Provider, MD  diclofenac sodium (VOLTAREN) 1 % GEL Apply 2 g topically 4 (four) times daily. 01/04/15  Yes Courtney Forcucci, PA-C  esomeprazole (NEXIUM) 40 MG capsule Take 1 capsule by mouth daily. 05/09/15  Yes Historical Provider, MD  fexofenadine-pseudoephedrine (ALLEGRA-D) 60-120 MG per tablet Take 1 tablet by mouth daily.  03/06/09  Yes Historical Provider, MD  fish oil-omega-3 fatty acids 1000 MG capsule Take 2 g by mouth daily.     Yes Historical Provider, MD  furosemide (LASIX) 40 MG tablet Take one tablet (40 mg) by mouth every other day 06/12/15  Yes Deboraha Sprang, MD  glucosamine-chondroitin 500-400 MG tablet Take 1 tablet by mouth 2 (two) times daily.     Yes Historical Provider, MD  halobetasol (ULTRAVATE) 0.05 % cream Apply topically 2 (two) times daily. 04/30/15  Yes Unk Pinto, MD  IRON PO Take 45 mg by mouth daily.    Yes Historical Provider, MD  Magnesium 250 MG TABS Take 1 tablet by mouth 2 (two) times daily.    Yes Historical Provider, MD  metoprolol succinate (TOPROL-XL) 25 MG 24 hr tablet TAKE 1 TABLET DAILY 07/11/15  Yes Unk Pinto, MD  Multiple Minerals-Vitamins (CITRACAL PLUS PO) Take 1 capsule by mouth daily.    Yes Historical Provider, MD  OVER THE COUNTER MEDICATION Take 1 tablet by mouth daily. claritin 10 mg daily for allergies PRN   Yes Historical Provider, MD  polyethylene glycol (MIRALAX / GLYCOLAX) packet Take 17 g by mouth as needed for moderate constipation.    Yes Historical Provider, MD  quinapril (ACCUPRIL) 5 MG tablet Take 1 tablet (5 mg total) by mouth daily. 05/18/15 05/17/16 Yes Deboraha Sprang, MD  Red Yeast Rice Extract (RED YEAST RICE PO) Take 2 capsules by mouth daily. 2 DAILY   Yes Historical Provider, MD  vitamin C (ASCORBIC ACID) 500 MG tablet Take 1,000 mg by mouth daily.     Yes Historical Provider, MD   BP 111/59 mmHg  Pulse 61  Temp(Src) 99.3 F (37.4 C) (Oral)  Resp 20  Ht 5\' 11"  (1.803 m)  Wt 133 lb 8 oz (60.555 kg)  BMI 18.63 kg/m2  SpO2 100%  Vitals reviewed Physical Exam  Physical Examination: General appearance - alert, frail and chronically ill appearing, and in no distress Mental status - alert, oriented to person, place, and time Eyes - no conjunctival injection, no scleral icterus Head- NCAT Mouth - mucous membranes moist, pharynx normal without lesions Chest - clear to auscultation, no wheezes, rales or rhonchi, symmetric air entry Heart - normal rate, regular rhythm, normal S1, S2, no murmurs, rubs, clicks or gallops Abdomen - soft, nontender, nondistended, no masses or organomegaly Neurological - alert, oriented, normal speech, strength 5/5 in extremities x 4, sensation intact Extremities - peripheral pulses normal, no pedal edema, no clubbing or cyanosis, FROM of extremities x 4 without pain, no  deformity, no contusions or breaks in skin Skin - normal coloration and turgor, no rashes  ED Course  Procedures (including critical care time) Labs Review Labs Reviewed  CBC - Abnormal; Notable for the following:    RBC 3.55 (*)    Hemoglobin 10.7 (*)    HCT 33.2 (*)    All other components within normal limits  COMPREHENSIVE METABOLIC PANEL - Abnormal; Notable for the following:    Glucose, Bld 108 (*)  BUN 50 (*)    Creatinine, Ser 1.64 (*)    Albumin 3.3 (*)    GFR calc non Af Amer 37 (*)    GFR calc Af Amer 42 (*)    All other components within normal limits  URINALYSIS, ROUTINE W REFLEX MICROSCOPIC (NOT AT [facility]) - Abnormal; Notable for the following:    APPearance CLOUDY (*)    Hgb urine dipstick TRACE (*)    Nitrite POSITIVE (*)    Leukocytes, UA MODERATE (*)    All other components within normal limits  CK - Abnormal; Notable for the following:    Total CK 650 (*)    All other components within normal limits  URINE MICROSCOPIC-ADD ON - Abnormal; Notable for the following:    Bacteria, UA FEW (*)    All other components within normal limits  BASIC METABOLIC PANEL - Abnormal; Notable for the following:    Glucose, Bld 134 (*)    BUN 37 (*)    Creatinine, Ser 1.29 (*)    Calcium 8.7 (*)    GFR calc non Af Amer 49 (*)    GFR calc Af Amer 57 (*)    All other components within normal limits  CBC - Abnormal; Notable for the following:    RBC 3.45 (*)    Hemoglobin 10.4 (*)    HCT 32.2 (*)    All other components within normal limits  GLUCOSE, CAPILLARY - Abnormal; Notable for the following:    Glucose-Capillary 115 (*)    All other components within normal limits  MAGNESIUM  CREATININE, URINE, RANDOM  UREA NITROGEN, URINE  I-STAT TROPOININ, ED    Imaging Review Dg Chest 2 View  07/24/2015  CLINICAL DATA:  Altered mental status and generalize weakness with fatigue. Found on floor after 24 hours. EXAM: CHEST  2 VIEW COMPARISON:  09/01/2014 FINDINGS: Sternotomy  wire and left-sided pacemaker unchanged. Diffuse decreased bone mineralization is present. Lungs are adequately inflated without focal consolidation or effusion. Cardiomediastinal silhouette is within normal. There is a moderate size hiatal hernia present. There are degenerative changes of the spine with stable moderate compression fracture in the region of the thoracolumbar junction as well as mild loss of height of several additional thoracic vertebral bodies unchanged. IMPRESSION: No acute cardiopulmonary disease. Stable compression fracture at the thoracolumbar junction and stable loss of height of several additional thoracic vertebrae. Electronically Signed   By: Marin Olp M.D.   On: 07/24/2015 17:19   Ct Head Wo Contrast  07/24/2015  CLINICAL DATA:  Fall this morning. Generalized weakness. No loss of consciousness. Initial encounter. EXAM: CT HEAD WITHOUT CONTRAST TECHNIQUE: Contiguous axial images were obtained from the base of the skull through the vertex without intravenous contrast. COMPARISON:  10/30/2006 FINDINGS: Mild generalized cerebral atrophy is unchanged. Subcortical and periventricular cerebral white matter hypodensities are similar to the prior study and nonspecific but compatible with mild chronic small vessel ischemic disease. There is no evidence of acute cortical infarct, intracranial hemorrhage, mass, midline shift, or extra-axial fluid collection. Prior bilateral cataract extraction is noted. There is evidence of chronic bilateral maxillary sinusitis with increased, near complete opacification on the left. Mastoid air cells are clear. Calcified atherosclerosis is noted at the skullbase. No skull fracture is identified. IMPRESSION: 1. No evidence of acute intracranial abnormality. 2. Mild chronic small vessel ischemic disease and cerebral atrophy. Electronically Signed   By: Logan Bores M.D.   On: 07/24/2015 16:52   I have personally reviewed and evaluated  these images and lab  results as part of my medical decision-making.   EKG Interpretation   Date/Time:  Tuesday Jul 24 2015 15:37:19 EDT Ventricular Rate:  76 PR Interval:  277 QRS Duration: 109 QT Interval:  559 QTC Calculation: 629 R Axis:   52 Text Interpretation:  Sinus rhythm Ventricular bigeminy Prolonged PR  interval LVH with secondary repolarization abnormality Inferior infarct,  old Baseline wander in lead(s) V5 Since previous tracing bigeminy is new,  rate slower Confirmed by Canary Brim  MD, Harley Mccartney [phone]) on 07/24/2015 3:45:55  PM      MDM   Final diagnoses:  Fall  UTI  Pt presenting with generalized weakness leading to 2 falls this week.  He has findings of UTI.  He denies syncope- medtronic pacer interrogated and no events found.  Pt has mild elevation in CK- presumably due to lying on the floor after fall.  Pt hydrated gently in the ED due to hx of chf.  Started on rocephin.  D/w [facility] for admission.     9:26 PM d/w Dr. Tamala Julian, [facility]- pt to be admitted to telemetry bed  Alfonzo Beers, MD 07/25/15 1950

## 2015-07-25 ENCOUNTER — Encounter (HOSPITAL_COMMUNITY): Payer: Self-pay | Admitting: Urology

## 2015-07-25 DIAGNOSIS — K219 Gastro-esophageal reflux disease without esophagitis: Secondary | ICD-10-CM | POA: Diagnosis present

## 2015-07-25 DIAGNOSIS — R269 Unspecified abnormalities of gait and mobility: Secondary | ICD-10-CM | POA: Diagnosis not present

## 2015-07-25 DIAGNOSIS — R259 Unspecified abnormal involuntary movements: Secondary | ICD-10-CM | POA: Diagnosis not present

## 2015-07-25 DIAGNOSIS — Z951 Presence of aortocoronary bypass graft: Secondary | ICD-10-CM | POA: Diagnosis not present

## 2015-07-25 DIAGNOSIS — N390 Urinary tract infection, site not specified: Secondary | ICD-10-CM

## 2015-07-25 DIAGNOSIS — R2689 Other abnormalities of gait and mobility: Secondary | ICD-10-CM | POA: Diagnosis not present

## 2015-07-25 DIAGNOSIS — K5909 Other constipation: Secondary | ICD-10-CM | POA: Diagnosis not present

## 2015-07-25 DIAGNOSIS — R531 Weakness: Secondary | ICD-10-CM | POA: Diagnosis not present

## 2015-07-25 DIAGNOSIS — R7303 Prediabetes: Secondary | ICD-10-CM | POA: Diagnosis present

## 2015-07-25 DIAGNOSIS — W19XXXA Unspecified fall, initial encounter: Secondary | ICD-10-CM | POA: Diagnosis present

## 2015-07-25 DIAGNOSIS — R509 Fever, unspecified: Secondary | ICD-10-CM | POA: Diagnosis not present

## 2015-07-25 DIAGNOSIS — R41841 Cognitive communication deficit: Secondary | ICD-10-CM | POA: Diagnosis not present

## 2015-07-25 DIAGNOSIS — I5042 Chronic combined systolic (congestive) and diastolic (congestive) heart failure: Secondary | ICD-10-CM | POA: Diagnosis present

## 2015-07-25 DIAGNOSIS — R5381 Other malaise: Secondary | ICD-10-CM | POA: Diagnosis not present

## 2015-07-25 DIAGNOSIS — R001 Bradycardia, unspecified: Secondary | ICD-10-CM | POA: Diagnosis present

## 2015-07-25 DIAGNOSIS — R296 Repeated falls: Secondary | ICD-10-CM | POA: Diagnosis present

## 2015-07-25 DIAGNOSIS — D6489 Other specified anemias: Secondary | ICD-10-CM | POA: Diagnosis present

## 2015-07-25 DIAGNOSIS — N179 Acute kidney failure, unspecified: Secondary | ICD-10-CM | POA: Diagnosis present

## 2015-07-25 DIAGNOSIS — Z95810 Presence of automatic (implantable) cardiac defibrillator: Secondary | ICD-10-CM | POA: Diagnosis not present

## 2015-07-25 DIAGNOSIS — I255 Ischemic cardiomyopathy: Secondary | ICD-10-CM | POA: Diagnosis present

## 2015-07-25 DIAGNOSIS — Z7982 Long term (current) use of aspirin: Secondary | ICD-10-CM | POA: Diagnosis not present

## 2015-07-25 DIAGNOSIS — M179 Osteoarthritis of knee, unspecified: Secondary | ICD-10-CM | POA: Diagnosis present

## 2015-07-25 DIAGNOSIS — E785 Hyperlipidemia, unspecified: Secondary | ICD-10-CM | POA: Diagnosis present

## 2015-07-25 DIAGNOSIS — W19XXXS Unspecified fall, sequela: Secondary | ICD-10-CM | POA: Diagnosis not present

## 2015-07-25 DIAGNOSIS — R5383 Other fatigue: Secondary | ICD-10-CM | POA: Diagnosis not present

## 2015-07-25 DIAGNOSIS — M6281 Muscle weakness (generalized): Secondary | ICD-10-CM | POA: Diagnosis not present

## 2015-07-25 DIAGNOSIS — E860 Dehydration: Secondary | ICD-10-CM | POA: Diagnosis present

## 2015-07-25 DIAGNOSIS — I2510 Atherosclerotic heart disease of native coronary artery without angina pectoris: Secondary | ICD-10-CM | POA: Diagnosis present

## 2015-07-25 DIAGNOSIS — I10 Essential (primary) hypertension: Secondary | ICD-10-CM | POA: Diagnosis not present

## 2015-07-25 DIAGNOSIS — I252 Old myocardial infarction: Secondary | ICD-10-CM | POA: Diagnosis not present

## 2015-07-25 DIAGNOSIS — I110 Hypertensive heart disease with heart failure: Secondary | ICD-10-CM | POA: Diagnosis present

## 2015-07-25 DIAGNOSIS — R1311 Dysphagia, oral phase: Secondary | ICD-10-CM | POA: Diagnosis not present

## 2015-07-25 DIAGNOSIS — M545 Low back pain: Secondary | ICD-10-CM | POA: Diagnosis not present

## 2015-07-25 DIAGNOSIS — R2681 Unsteadiness on feet: Secondary | ICD-10-CM | POA: Diagnosis not present

## 2015-07-25 LAB — BASIC METABOLIC PANEL
ANION GAP: 8 (ref 5–15)
BUN: 37 mg/dL — ABNORMAL HIGH (ref 6–20)
CALCIUM: 8.7 mg/dL — AB (ref 8.9–10.3)
CO2: 27 mmol/L (ref 22–32)
Chloride: 102 mmol/L (ref 101–111)
Creatinine, Ser: 1.29 mg/dL — ABNORMAL HIGH (ref 0.61–1.24)
GFR, EST AFRICAN AMERICAN: 57 mL/min — AB (ref >60)
GFR, EST NON AFRICAN AMERICAN: 49 mL/min — AB (ref >60)
Glucose, Bld: 134 mg/dL — ABNORMAL HIGH (ref 65–99)
POTASSIUM: 4.2 mmol/L (ref 3.5–5.1)
Sodium: 137 mmol/L (ref 135–145)

## 2015-07-25 LAB — CBC
HCT: 32.2 % — ABNORMAL LOW (ref 39.0–52.0)
HEMOGLOBIN: 10.4 g/dL — AB (ref 13.0–17.0)
MCH: 30.1 pg (ref 26.0–34.0)
MCHC: 32.3 g/dL (ref 30.0–36.0)
MCV: 93.3 fL (ref 78.0–100.0)
PLATELETS: 186 10*3/uL (ref 150–400)
RBC: 3.45 MIL/uL — AB (ref 4.22–5.81)
RDW: 13.9 % (ref 11.5–15.5)
WBC: 6.9 10*3/uL (ref 4.0–10.5)

## 2015-07-25 LAB — GLUCOSE, CAPILLARY: Glucose-Capillary: 115 mg/dL — ABNORMAL HIGH (ref 65–99)

## 2015-07-25 LAB — MAGNESIUM: MAGNESIUM: 2.1 mg/dL (ref 1.7–2.4)

## 2015-07-25 LAB — CREATININE, URINE, RANDOM: Creatinine, Urine: 67.01 mg/dL

## 2015-07-25 MED ORDER — DEXTROSE 5 % IV SOLN
1.0000 g | Freq: Once | INTRAVENOUS | Status: AC
  Administered 2015-07-25: 1 g via INTRAVENOUS
  Filled 2015-07-25: qty 10

## 2015-07-25 MED ORDER — KCL-LACTATED RINGERS-D5W 20 MEQ/L IV SOLN
INTRAVENOUS | Status: DC
  Administered 2015-07-25 – 2015-07-26 (×2): via INTRAVENOUS
  Filled 2015-07-25 (×5): qty 1000

## 2015-07-25 NOTE — Progress Notes (Signed)
PROGRESS NOTE    Richard Moody  [id number] DOB: [birth year] DOA: 07/24/2015 PCP: Alesia Richards, MD    Assessment & Plan:   Principal Problem:   Falls - continue PT Active Problems:   Automatic implantable cardioverter-defibrillator in situ   Essential hypertension   UTI (lower urinary tract infection) - continue Rocephin   Chronic combined systolic and diastolic congestive heart failure ([place]) - appears stable   DVT prophylaxis: Lovenox Code Status: full Family Communication: None Disposition Plan: Pending improvement in condition, f/u with u/c results   Consultants:   None   Antimicrobials:  Rocephin   Subjective: No new complaints  Objective: Filed Vitals:   07/25/15 0501 07/25/15 1037 07/25/15 1235 07/25/15 1421  BP: 108/61 101/49 106/57 111/59  Pulse: 73 65 59 61  Temp: 100.2 F (37.9 C)  98.2 F (36.8 C) 99.3 F (37.4 C)  TempSrc: Oral  Oral   Resp: 16  20   Height:      Weight:      SpO2: 100%  99% 100%    Intake/Output Summary (Last 24 hours) at 07/25/15 1802 Last data filed at 07/25/15 1406  Gross per 24 hour  Intake 928.75 ml  Output   1350 ml  Net -421.25 ml   Filed Weights   07/24/15 2356  Weight: 60.555 kg (133 lb 8 oz)    Examination:  General exam: Appears calm and comfortable  Respiratory system: Clear to auscultation. Respiratory effort normal. Cardiovascular system: S1 & S2 heard,No JVD Gastrointestinal system: Abdomen is nondistended, soft and nontender.  Central nervous system: Alert and awake No focal neurological deficits. Extremities: Symmetric 5 x 5 power. Skin: No rashes, lesions or ulcers Psychiatry:  Mood & affect appropriate.     Data Reviewed: I have personally reviewed following labs and imaging studies  CBC:  Recent Labs Lab 07/24/15 1551 07/25/15 0430  WBC 5.7 6.9  HGB 10.7* 10.4*  HCT 33.2* 32.2*  MCV 93.5 93.3  PLT 206 [id number]   Basic Metabolic Panel:  Recent Labs Lab  07/24/15 1551 07/25/15 0430  NA 140 137  K 4.6 4.2  CL 102 102  CO2 28 27  GLUCOSE 108* 134*  BUN 50* 37*  CREATININE 1.64* 1.29*  CALCIUM 9.2 8.7*  MG  --  2.1   GFR: Estimated Creatinine Clearance: 35.9 mL/min (by C-G formula based on Cr of 1.29). Liver Function Tests:  Recent Labs Lab 07/24/15 1551  AST 31  ALT 17  ALKPHOS 80  BILITOT 0.9  PROT 6.5  ALBUMIN 3.3*   No results for input(s): LIPASE, AMYLASE in the last 168 hours. No results for input(s): AMMONIA in the last 168 hours. Coagulation Profile: No results for input(s): INR, PROTIME in the last 168 hours. Cardiac Enzymes:  Recent Labs Lab 07/24/15 1551  CKTOTAL 650*   BNP (last 3 results) No results for input(s): PROBNP in the last 8760 hours. HbA1C: No results for input(s): HGBA1C in the last 72 hours. CBG:  Recent Labs Lab 07/25/15 1425  GLUCAP 115*   Lipid Profile: No results for input(s): CHOL, HDL, LDLCALC, TRIG, CHOLHDL, LDLDIRECT in the last 72 hours. Thyroid Function Tests: No results for input(s): TSH, T4TOTAL, FREET4, T3FREE, THYROIDAB in the last 72 hours. Anemia Panel: No results for input(s): VITAMINB12, FOLATE, FERRITIN, TIBC, IRON, RETICCTPCT in the last 72 hours. Sepsis Labs: No results for input(s): PROCALCITON, LATICACIDVEN in the last 168 hours.  No results found for this or any previous visit (from the past 240  hour(s)).       Radiology Studies: Dg Chest 2 View  07/24/2015  CLINICAL DATA:  Altered mental status and generalize weakness with fatigue. Found on floor after 24 hours. EXAM: CHEST  2 VIEW COMPARISON:  09/01/2014 FINDINGS: Sternotomy wire and left-sided pacemaker unchanged. Diffuse decreased bone mineralization is present. Lungs are adequately inflated without focal consolidation or effusion. Cardiomediastinal silhouette is within normal. There is a moderate size hiatal hernia present. There are degenerative changes of the spine with stable moderate compression  fracture in the region of the thoracolumbar junction as well as mild loss of height of several additional thoracic vertebral bodies unchanged. IMPRESSION: No acute cardiopulmonary disease. Stable compression fracture at the thoracolumbar junction and stable loss of height of several additional thoracic vertebrae. Electronically Signed   By: Marin Olp M.D.   On: 07/24/2015 17:19   Ct Head Wo Contrast  07/24/2015  CLINICAL DATA:  Fall this morning. Generalized weakness. No loss of consciousness. Initial encounter. EXAM: CT HEAD WITHOUT CONTRAST TECHNIQUE: Contiguous axial images were obtained from the base of the skull through the vertex without intravenous contrast. COMPARISON:  10/30/2006 FINDINGS: Mild generalized cerebral atrophy is unchanged. Subcortical and periventricular cerebral white matter hypodensities are similar to the prior study and nonspecific but compatible with mild chronic small vessel ischemic disease. There is no evidence of acute cortical infarct, intracranial hemorrhage, mass, midline shift, or extra-axial fluid collection. Prior bilateral cataract extraction is noted. There is evidence of chronic bilateral maxillary sinusitis with increased, near complete opacification on the left. Mastoid air cells are clear. Calcified atherosclerosis is noted at the skullbase. No skull fracture is identified. IMPRESSION: 1. No evidence of acute intracranial abnormality. 2. Mild chronic small vessel ischemic disease and cerebral atrophy. Electronically Signed   By: Logan Bores M.D.   On: 07/24/2015 16:52        Scheduled Meds: . amiodarone  200 mg Oral Daily  . aspirin EC  81 mg Oral Once per day on Mon Wed Fri  . cefTRIAXone (ROCEPHIN)  IV  1 g Intravenous Once  . clobetasol cream   Topical BID  . darifenacin  7.5 mg Oral Daily  . enoxaparin (LOVENOX) injection  30 mg Subcutaneous Q24H  . ferrous sulfate  325 mg Oral Daily  . guaiFENesin  600 mg Oral BID  . magnesium oxide  400 mg  Oral BID  . metoprolol succinate  25 mg Oral Daily  . multivitamin with minerals  1 tablet Oral Daily  . omega-3 acid ethyl esters  1 g Oral Daily  . pantoprazole  40 mg Oral Daily  . sodium chloride flush  3 mL Intravenous Q12H  . vitamin C  1,000 mg Oral Daily   Continuous Infusions: . dextrose 5% lactated ringers with KCl 20 mEq/L 100 mL/hr at 07/25/15 1736        Time spent: > 35 minutes    Velvet Bathe, MD [facility] Pager [phone]  If 7PM-7AM, please contact night-coverage [email or web address] Password [facility] 07/25/2015, 6:02 PM

## 2015-07-25 NOTE — Consult Note (Signed)
   [facility] Inpatient Consult   07/25/2015  Richard Moody [id number] 07/09/1927 [id number]  Patient screened for potential [facility] services. Patient is eligible for [place] under his Medicare benefit.  . Electronic medical record reveals patient's a 82 y/o M who has had multiple falls the week prior to admission w/ confusion. Workup revealed UTI. Pt's PMH includes ischemic cardiomyopathy, Rt TKA, MI, ICD, CABG.  Review of Physical Therapy notes indicates patient is appropriate for [facility] care for post hospital disposition. Will follow up with disposition and post hospital needs to determine if [facility] will be needed.    For questions please contact:   Natividad Brood, RN BSN [facility] Liaison  [phone] business mobile phone Toll free office [phone]

## 2015-07-25 NOTE — Evaluation (Signed)
Physical Therapy Evaluation Patient Details Name: Richard Moody MRN: [id number] DOB: Jun 28, 1927 Today's Date: 07/25/2015   History of Present Illness  Pt is a 82 y/o M who has had multiple falls the week prior to admission w/ confusion.  Workup revealed UTI.  Pt's PMH includes ischemic cardiomyopathy, Rt TKA, MI, ICD, CABG.    Clinical Impression  Pt admitted with above diagnosis. Pt currently with functional limitations due to the deficits listed below (see PT Problem List). Richard Moody presents w/ impaired cognition, generalized weakness, and impaired balance. Wife expresses concern about not being able to provide level of care pt may need at d/c.  Pt currently requires mod assist for sit>stand transfer using RW.  Recommending ST SNF prior to return home. Pt will benefit from skilled PT to increase their independence and safety with mobility to allow discharge to the venue listed below.      Follow Up Recommendations SNF;Supervision/Assistance - 24 hour    Equipment Recommendations  None recommended by PT    Recommendations for Other Services       Precautions / Restrictions Precautions Precautions: Fall;ICD/Pacemaker Restrictions Weight Bearing Restrictions: No      Mobility  Bed Mobility Overal bed mobility: Needs Assistance Bed Mobility: Supine to Sit     Supine to sit: Min guard;HOB elevated     General bed mobility comments: Pt requires increased time w/ use of bed rail.  Cues to scoot to EOB as pt demonstrates posterior lean when feet unsupported.  Transfers Overall transfer level: Needs assistance Equipment used: Rolling walker (2 wheeled) Transfers: Sit to/from [facility] Sit to Stand: Mod assist Stand pivot transfers: Min assist       General transfer comment: Assist to boost to standing and to steady w/ stand pivot to chair.  Cues for proper management of RW.  Ambulation/Gait             General Gait Details: pt politely declined  and began to sit  Stairs            Wheelchair Mobility    Modified Rankin (Stroke Patients Only)       Balance Overall balance assessment: Needs assistance;History of Falls Sitting-balance support: Bilateral upper extremity supported;Feet supported Sitting balance-Leahy Scale: Fair     Standing balance support: Bilateral upper extremity supported;During functional activity Standing balance-Leahy Scale: Poor Standing balance comment: Bil UE support to maintain balance                             Pertinent Vitals/Pain Pain Assessment: Faces Faces Pain Scale: Hurts little more Pain Location: chronic Bil shoulder and Lt knee pain due to arthritis Pain Descriptors / Indicators: Aching;Grimacing;Moaning Pain Intervention(s): Limited activity within patient's tolerance;Monitored during session;Repositioned    Home Living Family/patient expects to be discharged to:: Skilled nursing facility Living Arrangements: Spouse/significant other Available Help at Discharge: Family;Available 24 hours/day Type of Home: House Home Access: Stairs to enter Entrance Stairs-Rails: Left Entrance Stairs-Number of Steps: 4 Home Layout: Two level;Able to live on main level with bedroom/bathroom Home Equipment: Gilford Rile - 2 wheels;Cane - single point;Bedside commode;Shower seat      Prior Function Level of Independence: Needs assistance   Gait / Transfers Assistance Needed: Ambulates w/ cane.  3 falls in the past week while wife was away and family watching pt intermittently.  Pt unable to explain reason for falls.  ADL's / Homemaking Assistance Needed: Wife assists w/ washing back and  lower body and assist w/ dressing        Hand Dominance        Extremity/Trunk Assessment   Upper Extremity Assessment: Generalized weakness (Bil shoulder flexion limited to ~70 deg due to pain)           Lower Extremity Assessment: Generalized weakness      Cervical / Trunk  Assessment: Kyphotic  Communication   Communication: HOH  Cognition Arousal/Alertness: Awake/alert Behavior During Therapy: WFL for tasks assessed/performed Overall Cognitive Status: Impaired/Different from baseline Area of Impairment: Orientation;Memory;Following commands;Safety/judgement;Awareness;Problem solving Orientation Level: Disoriented to;Place;Time;Situation   Memory: Decreased short-term memory Following Commands: Follows one step commands with increased time Safety/Judgement: Decreased awareness of safety;Decreased awareness of deficits Awareness: Intellectual Problem Solving: Slow processing;Requires verbal cues      General Comments      Exercises General Exercises - Lower Extremity Ankle Circles/Pumps: AROM;Both;15 reps;Seated Long Arc Quad: AROM;Both;10 reps;Seated;Other (comment) (pt unable to tolerate manual resistance due to pain )      Assessment/Plan    PT Assessment Patient needs continued PT services  PT Diagnosis Difficulty walking;Generalized weakness   PT Problem List Decreased strength;Decreased activity tolerance;Decreased balance;Decreased range of motion;Decreased mobility;Decreased cognition;Decreased knowledge of use of DME;Decreased safety awareness;Pain  PT Treatment Interventions DME instruction;Gait training;Stair training;Functional mobility training;Therapeutic activities;Therapeutic exercise;Balance training;Cognitive remediation;Patient/family education;Modalities   PT Goals (Current goals can be found in the Care Plan section) Acute Rehab PT Goals Patient Stated Goal: none stated; per wife, to go to rehab before home PT Goal Formulation: With patient/family Time For Goal Achievement: 08/08/15 Potential to Achieve Goals: Good    Frequency Min 3X/week   Barriers to discharge Inaccessible home environment;Decreased caregiver support steps to enter home; Wife expresses concern that she will not be able to provide level of assist pt may  need at d/c    Co-evaluation               End of Session Equipment Utilized During Treatment: Gait belt Activity Tolerance: Patient limited by fatigue;Patient limited by pain Patient left: in chair;with call bell/phone within reach;with chair alarm set;with family/visitor present Nurse Communication: Mobility status    Functional Assessment Tool Used: Clinical Judgement Functional Limitation: Mobility: Walking and moving around Mobility: Walking and Moving Around Current Status [id number]): At least 20 percent but less than 40 percent impaired, limited or restricted Mobility: Walking and Moving Around Goal Status [phone]): At least 1 percent but less than 20 percent impaired, limited or restricted    Time: [id number] PT Time Calculation (min) (ACUTE ONLY): 21 min   Charges:   PT Evaluation $PT Eval Low Complexity: 1 Procedure     PT G Codes:   PT G-Codes **NOT FOR INPATIENT CLASS** Functional Assessment Tool Used: Clinical Judgement Functional Limitation: Mobility: Walking and moving around Mobility: Walking and Moving Around Current Status [id number]): At least 20 percent but less than 40 percent impaired, limited or restricted Mobility: Walking and Moving Around Goal Status [phone]): At least 1 percent but less than 20 percent impaired, limited or restricted   Collie Siad PT, DPT  Pager: [phone] Phone: [phone] 07/25/2015, 11:36 AM

## 2015-07-26 DIAGNOSIS — I10 Essential (primary) hypertension: Secondary | ICD-10-CM

## 2015-07-26 LAB — UREA NITROGEN, URINE: Urea Nitrogen, Ur: 709 mg/dL

## 2015-07-26 NOTE — Progress Notes (Signed)
Pt wife told RN that pt is having trouble swallowing. Wife states that this is a problem pt has had for 40 years and that pt has esophageal narrowing and has had his esophagus stretched previously. Pt able to swallow fine throughout the day. Pt not able to get evening pills. MD aware and given orders to place dysphagia diet with aspiration precautions. Suction set up at bedside. Will continue to monitor.   Clarise Cruz, RN

## 2015-07-26 NOTE — Clinical Social Work Note (Addendum)
CSW went by patient's room for assessment and to discuss plan for SNF at discharge. His wife was not there. CSW called patient's wife and left a message with contact information. Will continue to go by room to see if she arrives.  Dayton Scrape, [place] [phone]  CSW went to patient's room. Wife and two other supports at bedside. Patient has been at [facility] in [place] in the past. Patient and wife want [facility] instead of SNF. CSW let RNCM know.  Dayton Scrape, [place]

## 2015-07-26 NOTE — Progress Notes (Signed)
PROGRESS NOTE    Richard Moody  [id number] DOB: 1927-07-17 DOA: 07/24/2015 PCP: Alesia Richards, MD   Brief Narrative: 82 y.o. male with medical history significant of ischemic cardiomyopathy with combined CHF last EF noted to be 35-40% in [name] 1 diastolic dysfunction, s/p AICD, CAD, HLD, GERD; who presents after being found down. The patient's wife had been out of town for graduations and he had been being watched by family members close by. History obtained by patient and family members. He had been noted to have fallen twice over the last week and family members had to help him up. He had initially fallen 2 days ago and then this morning around 2:30 AM.    Assessment & Plan:   Principal Problem:   Falls - continue PT  Active Problems:   Automatic implantable cardioverter-defibrillator in situ  AKI secondary to dehydration - resolving. S creatinine trending down.    Essential hypertension   UTI (lower urinary tract infection) - continue Rocephin    Chronic combined systolic and diastolic congestive heart failure ([place]) - appears stable   DVT prophylaxis: Lovenox Code Status: full Family Communication: None Disposition Plan: Pending improvement in condition   Consultants:   None   Antimicrobials:  Rocephin>>>5/24   Subjective: No new complaints  Objective: Filed Vitals:   07/25/15 2347 07/26/15 0443 07/26/15 0718 07/26/15 1145  BP: 132/62 115/58 121/60 123/52  Pulse: 60 56 62 63  Temp: 97.6 F (36.4 C) 97.9 F (36.6 C) 97.9 F (36.6 C) 98.1 F (36.7 C)  TempSrc: Oral Oral Oral Oral  Resp: 15 16 16 15   Height:  5\' 11"  (1.803 m)    Weight:  62.097 kg (136 lb 14.4 oz)    SpO2: 100% 96% 95% 99%    Intake/Output Summary (Last 24 hours) at 07/26/15 1434 Last data filed at 07/26/15 1405  Gross per 24 hour  Intake   1710 ml  Output   1271 ml  Net    439 ml   Filed Weights   07/24/15 2356 07/26/15 0443  Weight: 60.555 kg (133 lb 8 oz)  62.097 kg (136 lb 14.4 oz)    Examination:  General exam: Appears calm and comfortable  Respiratory system: Clear to auscultation. Respiratory effort normal. Cardiovascular system: S1 & S2 heard,No JVD Gastrointestinal system: Abdomen is nondistended, soft and nontender.  Central nervous system: Alert and awake No focal neurological deficits. Extremities: Symmetric 5 x 5 power. Skin: No rashes, lesions or ulcers Psychiatry:  Mood & affect appropriate.     Data Reviewed: I have personally reviewed following labs and imaging studies  CBC:  Recent Labs Lab 07/24/15 1551 07/25/15 0430  WBC 5.7 6.9  HGB 10.7* 10.4*  HCT 33.2* 32.2*  MCV 93.5 93.3  PLT 206 [id number]   Basic Metabolic Panel:  Recent Labs Lab 07/24/15 1551 07/25/15 0430  NA 140 137  K 4.6 4.2  CL 102 102  CO2 28 27  GLUCOSE 108* 134*  BUN 50* 37*  CREATININE 1.64* 1.29*  CALCIUM 9.2 8.7*  MG  --  2.1   GFR: Estimated Creatinine Clearance: 36.8 mL/min (by C-G formula based on Cr of 1.29). Liver Function Tests:  Recent Labs Lab 07/24/15 1551  AST 31  ALT 17  ALKPHOS 80  BILITOT 0.9  PROT 6.5  ALBUMIN 3.3*   No results for input(s): LIPASE, AMYLASE in the last 168 hours. No results for input(s): AMMONIA in the last 168 hours. Coagulation Profile: No results for input(s):  INR, PROTIME in the last 168 hours. Cardiac Enzymes:  Recent Labs Lab 07/24/15 1551  CKTOTAL 650*   BNP (last 3 results) No results for input(s): PROBNP in the last 8760 hours. HbA1C: No results for input(s): HGBA1C in the last 72 hours. CBG:  Recent Labs Lab 07/25/15 1425  GLUCAP 115*   Lipid Profile: No results for input(s): CHOL, HDL, LDLCALC, TRIG, CHOLHDL, LDLDIRECT in the last 72 hours. Thyroid Function Tests: No results for input(s): TSH, T4TOTAL, FREET4, T3FREE, THYROIDAB in the last 72 hours. Anemia Panel: No results for input(s): VITAMINB12, FOLATE, FERRITIN, TIBC, IRON, RETICCTPCT in the last 72  hours. Sepsis Labs: No results for input(s): PROCALCITON, LATICACIDVEN in the last 168 hours.  No results found for this or any previous visit (from the past 240 hour(s)).       Radiology Studies: Dg Chest 2 View  07/24/2015  CLINICAL DATA:  Altered mental status and generalize weakness with fatigue. Found on floor after 24 hours. EXAM: CHEST  2 VIEW COMPARISON:  09/01/2014 FINDINGS: Sternotomy wire and left-sided pacemaker unchanged. Diffuse decreased bone mineralization is present. Lungs are adequately inflated without focal consolidation or effusion. Cardiomediastinal silhouette is within normal. There is a moderate size hiatal hernia present. There are degenerative changes of the spine with stable moderate compression fracture in the region of the thoracolumbar junction as well as mild loss of height of several additional thoracic vertebral bodies unchanged. IMPRESSION: No acute cardiopulmonary disease. Stable compression fracture at the thoracolumbar junction and stable loss of height of several additional thoracic vertebrae. Electronically Signed   By: Marin Olp M.D.   On: 07/24/2015 17:19   Ct Head Wo Contrast  07/24/2015  CLINICAL DATA:  Fall this morning. Generalized weakness. No loss of consciousness. Initial encounter. EXAM: CT HEAD WITHOUT CONTRAST TECHNIQUE: Contiguous axial images were obtained from the base of the skull through the vertex without intravenous contrast. COMPARISON:  10/30/2006 FINDINGS: Mild generalized cerebral atrophy is unchanged. Subcortical and periventricular cerebral white matter hypodensities are similar to the prior study and nonspecific but compatible with mild chronic small vessel ischemic disease. There is no evidence of acute cortical infarct, intracranial hemorrhage, mass, midline shift, or extra-axial fluid collection. Prior bilateral cataract extraction is noted. There is evidence of chronic bilateral maxillary sinusitis with increased, near complete  opacification on the left. Mastoid air cells are clear. Calcified atherosclerosis is noted at the skullbase. No skull fracture is identified. IMPRESSION: 1. No evidence of acute intracranial abnormality. 2. Mild chronic small vessel ischemic disease and cerebral atrophy. Electronically Signed   By: Logan Bores M.D.   On: 07/24/2015 16:52        Scheduled Meds: . amiodarone  200 mg Oral Daily  . aspirin EC  81 mg Oral Once per day on Mon Wed Fri  . clobetasol cream   Topical BID  . darifenacin  7.5 mg Oral Daily  . enoxaparin (LOVENOX) injection  30 mg Subcutaneous Q24H  . ferrous sulfate  325 mg Oral Daily  . guaiFENesin  600 mg Oral BID  . magnesium oxide  400 mg Oral BID  . metoprolol succinate  25 mg Oral Daily  . multivitamin with minerals  1 tablet Oral Daily  . omega-3 acid ethyl esters  1 g Oral Daily  . pantoprazole  40 mg Oral Daily  . sodium chloride flush  3 mL Intravenous Q12H  . vitamin C  1,000 mg Oral Daily   Continuous Infusions:     LOS: 1  day    Time spent: > 35 minutes    Velvet Bathe, MD [facility] Pager [phone]  If 7PM-7AM, please contact night-coverage [email or web address] Password [facility] 07/26/2015, 2:34 PM

## 2015-07-27 ENCOUNTER — Inpatient Hospital Stay (HOSPITAL_COMMUNITY): Payer: Medicare Other

## 2015-07-27 NOTE — Procedures (Signed)
Objective Swallowing Evaluation: Type of Study: Modified Barium Swallow Evaluation  Patient Details  Name: Richard Moody MRN: [id number] Date of Birth: 1927-08-12  Today's Date: 07/27/2015 Time: SLP Start Time (ACUTE ONLY): 1055-SLP Stop Time (ACUTE ONLY): 1117 SLP Time Calculation (min) (ACUTE ONLY): 22 min  Past Medical History:  Past Medical History  Diagnosis Date  . Ischemic cardiomyopathy     EF 32%  . OA (osteoarthritis) of knee   . Ganglion cyst of wrist     LEFT WRIST  . MI, old     INFERIOR LATERAL  . Hyperlipidemia   . GERD (gastroesophageal reflux disease)   . ICD (implantable cardiac defibrillator) in place August 2010  . Hx of CABG 1992  . S/P cardiac cath 2006    Occluded SVG to OM. Managed medically  . Normal nuclear stress test June 2010    No ischemia. EF 32%  . Vitamin D deficiency   . Pre-diabetes    Past Surgical History:  Past Surgical History  Procedure Laterality Date  . Cardiac catheterization  10/14/2004    THERE IS POSTERIOR LATERAL HYPOKINESIA. EF 30-35%  . Coronary artery bypass graft  1992  . Knee surgery      RIGHT KNEE  . Cardiac defibrillator placement  10/2008  . Inguinal hernia repair    . Cardiac catheterization  2006    OCCLUDED SVG TO OM  . Nuclear stress test  2010    EF 32%. No ischemia   HPI: 82 yo male adm to [facility] with fall - PMH + for dysphagia x at least 29 years requiring esophageal diliatation.  Pt also with weight loss recently .  RN reports pt had difficulties swallowing last pm causing him to expectorate and cough.  CXR negative 5/23 and CT head negative 5/23.  Pt is a full code and reports his goal is for swallowing to improve.    Subjective: pt awake in bed  Assessment / Plan / Recommendation  CHL IP CLINICAL IMPRESSIONS 07/27/2015  Therapy Diagnosis Mild oral phase dysphagia  Clinical Impression MIld oral dysphagia c/b lingual rocking due to weakness/discoordination resulting in delayed oral transiting and oral  residuals of liquids that prematurely spill into pharynx.  Pharyngeal swallow was strong and timely surprisingly with only mild residuals with cracker that pt sensed.  Reflexive dry swallows cleared residual.  Pt able to orally transit tablet with thin and it readily entered into stomach - radiologist not present to confirm.  Appearance of hiatal hernia present for which pt has already been diagnosed. Due to pt's weakness, chronic dysphagia, recommend pills with purees and softer diet.  Will follow up x1 due to dysphagia to assure tolerance, education.  Using live video, educated pt/spouse to compensations.   Based on these findings suspect pharyngeal sensation residuals is due to referrant from distal esophagus due to known hiatal hernia.    Impact on safety and function Mild aspiration risk      CHL IP TREATMENT RECOMMENDATION 07/27/2015  Treatment Recommendations Therapy as outlined in treatment plan below     Prognosis 07/27/2015  Prognosis for Safe Diet Advancement Good  Barriers to Reach Goals Severity of deficits  Barriers/Prognosis Comment chronicity of deficit    CHL IP DIET RECOMMENDATION 07/27/2015  SLP Diet Recommendations Dysphagia 3 (Mech soft) solids;Thin liquid  Liquid Administration via Cup;Straw  Medication Administration Whole meds with puree  Compensations Slow rate;Small sips/bites  Postural Changes Seated upright at 90 degrees;Remain semi-upright after after feeds/meals (Comment)  CHL IP OTHER RECOMMENDATIONS 07/27/2015  Recommended Consults --  Oral Care Recommendations Oral care BID  Other Recommendations Have oral suction available      No flowsheet data found.    CHL IP FREQUENCY AND DURATION 07/27/2015  Speech Therapy Frequency (ACUTE ONLY) min 1 x/week  Treatment Duration 1 week           CHL IP ORAL PHASE 07/27/2015  Oral Phase Impaired  Oral - Pudding Teaspoon --  Oral - Pudding Cup --  Oral - Honey Teaspoon --  Oral - Honey Cup --  Oral - Nectar  Teaspoon --  Oral - Nectar Cup Lingual pumping;Weak lingual manipulation  Oral - Nectar Straw --  Oral - Thin Teaspoon --  Oral - Thin Cup Weak lingual manipulation;Lingual pumping;Lingual/palatal residue  Oral - Thin Straw Weak lingual manipulation;Lingual pumping;Lingual/palatal residue  Oral - Puree Weak lingual manipulation;Lingual pumping  Oral - Mech Soft Weak lingual manipulation;Lingual pumping  Oral - Regular --  Oral - Multi-Consistency --  Oral - Pill Weak lingual manipulation;Lingual pumping  Oral Phase - Comment --    CHL IP PHARYNGEAL PHASE 07/27/2015  Pharyngeal Phase WFL  Pharyngeal- Pudding Teaspoon --  Pharyngeal --  Pharyngeal- Pudding Cup --  Pharyngeal --  Pharyngeal- Honey Teaspoon --  Pharyngeal --  Pharyngeal- Honey Cup --  Pharyngeal --  Pharyngeal- Nectar Teaspoon --  Pharyngeal --  Pharyngeal- Nectar Cup --  Pharyngeal --  Pharyngeal- Nectar Straw --  Pharyngeal --  Pharyngeal- Thin Teaspoon --  Pharyngeal --  Pharyngeal- Thin Cup --  Pharyngeal --  Pharyngeal- Thin Straw --  Pharyngeal --  Pharyngeal- Puree --  Pharyngeal --  Pharyngeal- Mechanical Soft --  Pharyngeal --  Pharyngeal- Regular --  Pharyngeal --  Pharyngeal- Multi-consistency --  Pharyngeal --  Pharyngeal- Pill --  Pharyngeal --  Pharyngeal Comment --     CHL IP CERVICAL ESOPHAGEAL PHASE 07/27/2015  Cervical Esophageal Phase WFL  Pudding Teaspoon --  Pudding Cup --  Honey Teaspoon --  Honey Cup --  Nectar Teaspoon --  Nectar Cup --  Nectar Straw --  Thin Teaspoon --  Thin Cup --  Thin Straw --  Puree --  Mechanical Soft --  Regular --  Multi-consistency --  Pill --  Cervical Esophageal Comment --    No flowsheet data found.  Macario Golds 07/27/2015, 12:55 PM

## 2015-07-27 NOTE — Evaluation (Signed)
Clinical/Bedside Swallow Evaluation Patient Details  Name: Richard Moody MRN: [id number] Date of Birth: 1928-02-22  Today's Date: 07/27/2015 Time: SLP Start Time (ACUTE ONLY): 0950 SLP Stop Time (ACUTE ONLY): 1026 SLP Time Calculation (min) (ACUTE ONLY): 36 min  Past Medical History:  Past Medical History  Diagnosis Date  . Ischemic cardiomyopathy     EF 32%  . OA (osteoarthritis) of knee   . Ganglion cyst of wrist     LEFT WRIST  . MI, old     INFERIOR LATERAL  . Hyperlipidemia   . GERD (gastroesophageal reflux disease)   . ICD (implantable cardiac defibrillator) in place August 2010  . Hx of CABG 1992  . S/P cardiac cath 2006    Occluded SVG to OM. Managed medically  . Normal nuclear stress test June 2010    No ischemia. EF 32%  . Vitamin D deficiency   . Pre-diabetes    Past Surgical History:  Past Surgical History  Procedure Laterality Date  . Cardiac catheterization  10/14/2004    THERE IS POSTERIOR LATERAL HYPOKINESIA. EF 30-35%  . Coronary artery bypass graft  1992  . Knee surgery      RIGHT KNEE  . Cardiac defibrillator placement  10/2008  . Inguinal hernia repair    . Cardiac catheterization  2006    OCCLUDED SVG TO OM  . Nuclear stress test  2010    EF 32%. No ischemia   HPI:  82 yo male adm to [facility] with fall - PMH + for dysphagia x at least 29 years requiring esophageal diliatation.  Pt also with weight loss recently but does not attribute this to dysphagia.  RN reports pt had difficulties swallowing last pm causing him to expectorate and cough.  CXR negative 5/23 and CT head negative 5/23.  Pt is a full code and reports his goal is for swallowing to improve.     Assessment / Plan / Recommendation Clinical Impression  Concern for multifactorial dysphagia present with possible pharyngeal weakness/compromised laryngeal elevation/closure and aspiration.  Known h/o esophageal deficits.  Pt only observed with ice chips and few boluses of icecream.  Multiple  swallows with decreased laryngeal elevation noted followed by throat clearing and productive cough.  Pt also with decreased left facial symmetry, movement and slight palatal deviation to right upon phonation.  Can not rule out pharyngeal dysphagia - aware of h/o esophageal deficits.    Pt reports goal is to improve swallowing and decrease difficulties.  Of note, wife reports pt's secretions have progressed to green tinged with admission.  Skilled intervention included education regarding likely functional goals.  Advised wife and pt that with exacerbation of chronic dysphagia, deconditioning, and malnutrition with advanced age, functional goal may be dysphagia/aspiration mitigation only.    Will plan MBS to allow instrumental eval of swallow.  Wife and pt educated and agreeable to plan.  Recommend pt be allowed ice chips only.     Aspiration Risk  Risk for inadequate nutrition/hydration;Severe aspiration risk    Diet Recommendation NPO;Ice chips PRN after oral care   Medication Administration: Via alternative means    Other  Recommendations   TBD  Follow up Recommendations    TBD   Frequency and Duration   tBD         Prognosis   Guarded due to chronicity and progression of deficits     Swallow Study   General Date of Onset: 07/27/15 HPI: 82 yo male adm to [facility] with fall -  PMH + for dysphagia x at least 29 years requiring esophageal diliatation.  Pt also with weight loss recently .  RN reports pt had difficulties swallowing last pm causing him to expectorate and cough.  CXR negative 5/23 and CT head negative 5/23.  Pt is a full code and reports his goal is for swallowing to improve.   Type of Study: Bedside Swallow Evaluation Diet Prior to this Study: NPO Temperature Spikes Noted: No Respiratory Status: Room air History of Recent Intubation: No Behavior/Cognition: Alert;Cooperative;Pleasant mood Oral Cavity Assessment: Dry Oral Care Completed by SLP: Yes Oral Cavity - Dentition:  Adequate natural dentition Vision: Functional for self-feeding Self-Feeding Abilities: Able to feed self Patient Positioning: Upright in bed Baseline Vocal Quality: Normal Volitional Cough: Strong (able to expectorate green tinged secretions) Volitional Swallow: Able to elicit    Oral/Motor/Sensory Function Overall Oral Motor/Sensory Function: Moderate impairment Facial ROM: Reduced left Facial Symmetry: Abnormal symmetry left Facial Strength: Reduced left Velum: Impaired left;Suspected CN X (Vagus) dysfunction   Ice Chips Ice chips: Impaired Presentation: Self Fed Pharyngeal Phase Impairments: Suspected delayed Swallow;Decreased hyoid-laryngeal movement;Throat Clearing - Immediate   Thin Liquid Thin Liquid: Not tested    Nectar Thick Nectar Thick Liquid: Not tested   Honey Thick Honey Thick Liquid: Not tested   Puree Puree: Impaired Presentation: Self Fed;Spoon Pharyngeal Phase Impairments: Suspected delayed Swallow;Decreased hyoid-laryngeal movement;Throat Clearing - Immediate;Cough - Delayed (expectoration of icecream,pt isolates sensation of resiudals pointing to vallecular space, concern for pharyngeal deficit) Other Comments: icecream   Solid   GO   Solid: Not tested        Luanna Salk, [place] [facility] SLP [phone]

## 2015-07-27 NOTE — Progress Notes (Signed)
Physical Therapy Treatment Patient Details Name: Richard Moody MRN: [id number] DOB: [birth year] Today's Date: 07/27/2015    History of Present Illness Pt is a 82 y/o M who has had multiple falls the week prior to admission w/ confusion.  Workup revealed UTI.  Pt's PMH includes ischemic cardiomyopathy, Rt TKA, MI, ICD, CABG.    PT Comments    Pt making slow, steady progress. Wife present for treatment and doesn't feel she can physically assist pt at this level. We recommended ST-SNF on eval and continue to do so. Pt and wife now apparently agreeable to SNF.  Follow Up Recommendations  SNF     Equipment Recommendations  None recommended by PT    Recommendations for Other Services       Precautions / Restrictions Precautions Precautions: Fall;ICD/Pacemaker Restrictions Weight Bearing Restrictions: No    Mobility  Bed Mobility Overal bed mobility: Needs Assistance Bed Mobility: Supine to Sit     Supine to sit: Min guard;HOB elevated     General bed mobility comments: Incr time and use of bed rail.  Transfers Overall transfer level: Needs assistance Equipment used: Rolling walker (2 wheeled) Transfers: Sit to/from Stand Sit to Stand: Min assist         General transfer comment: Assist to bring hips up and for balance  Ambulation/Gait Ambulation/Gait assistance: Min assist Ambulation Distance (Feet): 80 Feet Assistive device: Rolling walker (2 wheeled) Gait Pattern/deviations: Step-through pattern;Decreased step length - right;Decreased step length - left;Decreased dorsiflexion - left;Steppage;Trunk flexed Gait velocity: decr Gait velocity interpretation: <1.8 ft/sec, indicative of risk for recurrent falls General Gait Details: Assist for balance and support. Verbal cues to stand more erect and look forward   Stairs            Wheelchair Mobility    Modified Rankin (Stroke Patients Only)       Balance Overall balance assessment: Needs  assistance Sitting-balance support: No upper extremity supported;Feet supported Sitting balance-Leahy Scale: Fair     Standing balance support: Bilateral upper extremity supported Standing balance-Leahy Scale: Poor Standing balance comment: walker and min A for static standing                    Cognition Arousal/Alertness: Awake/alert Behavior During Therapy: WFL for tasks assessed/performed Overall Cognitive Status: Impaired/Different from baseline Area of Impairment: Memory;Following commands;Safety/judgement;Awareness;Problem solving     Memory: Decreased short-term memory Following Commands: Follows one step commands consistently Safety/Judgement: Decreased awareness of safety;Decreased awareness of deficits Awareness: Intellectual Problem Solving: Slow processing;Requires verbal cues      Exercises      General Comments        Pertinent Vitals/Pain Pain Assessment: No/denies pain    Home Living                      Prior Function            PT Goals (current goals can now be found in the care plan section) Progress towards PT goals: Progressing toward goals    Frequency  Min 3X/week    PT Plan Current plan remains appropriate    Co-evaluation             End of Session Equipment Utilized During Treatment: Gait belt Activity Tolerance: Patient tolerated treatment well Patient left: in chair;with call bell/phone within reach;with chair alarm set;with family/visitor present     Time: 1455-1514 PT Time Calculation (min) (ACUTE ONLY): 19 min  Charges:  $Gait Training: 8-22  mins                    G Codes:      Edilia Ghuman August 21, 2015, 3:25 PM [facility] PT [phone]

## 2015-07-27 NOTE — Progress Notes (Signed)
PROGRESS NOTE    Richard Moody  [id number] DOB: 1927/11/28 DOA: 07/24/2015 PCP: Alesia Richards, MD   Brief Narrative: 82 y.o. male with medical history significant of ischemic cardiomyopathy with combined CHF last EF noted to be 35-40% in [name] diastolic dysfunction, s/p AICD, CAD, HLD, GERD; who presents after being found down. The patient's wife had been out of town for graduations and he had been being watched by family members close by. History obtained by patient and family members. He had been noted to have fallen twice over the last week and family members had to help him up. He had initially fallen 2 days ago and then this morning around 2:30 AM.    Assessment & Plan:   Principal Problem:   Falls - Pt recommending SNF placement. D/c with wife who recommends placing in SNF  Active Problems:   Automatic implantable cardioverter-defibrillator in situ  AKI secondary to dehydration - resolving. S creatinine trending down.    Essential hypertension   UTI (lower urinary tract infection) - No complaints related to UTI, d/c'd rocephin    Chronic combined systolic and diastolic congestive heart failure (HCC) -  Continue current medication regimen   DVT prophylaxis: Lovenox Code Status: full Family Communication: None Disposition Plan: Commence discharge planning   Consultants:   None   Antimicrobials:  Rocephin>>>5/24   Subjective: No new complaints, wife prefers that patient go to SNF and patient agreeable.  Objective: Filed Vitals:   07/26/15 1538 07/26/15 2216 07/27/15 0538 07/27/15 1100  BP: 118/52 127/55 142/59 130/60  Pulse: 58 70 70 94  Temp: 98.3 F (36.8 C) 98.3 F (36.8 C) 98.2 F (36.8 C) 98.3 F (36.8 C)  TempSrc: Oral Oral Oral Oral  Resp: 16 16 16 16   Height:      Weight:   62.188 kg (137 lb 1.6 oz)   SpO2: 97% 98% 97% 98%    Intake/Output Summary (Last 24 hours) at 07/27/15 1759 Last data filed at 07/27/15 1221  Gross  per 24 hour  Intake    240 ml  Output    875 ml  Net   -635 ml   Filed Weights   07/24/15 2356 07/26/15 0443 07/27/15 0538  Weight: 60.555 kg (133 lb 8 oz) 62.097 kg (136 lb 14.4 oz) 62.188 kg (137 lb 1.6 oz)    Examination:  General exam: Appears calm and comfortable  Respiratory system: Clear to auscultation. Respiratory effort normal. Cardiovascular system: S1 & S2 heard,No JVD Gastrointestinal system: Abdomen is nondistended, soft and nontender.  Central nervous system: Alert and awake No focal neurological deficits. Extremities: Symmetric 5 x 5 power. Skin: No rashes, lesions or ulcers Psychiatry:  Mood & affect appropriate.     Data Reviewed: I have personally reviewed following labs and imaging studies  CBC:  Recent Labs Lab 07/24/15 1551 07/25/15 0430  WBC 5.7 6.9  HGB 10.7* 10.4*  HCT 33.2* 32.2*  MCV 93.5 93.3  PLT 206 [id number]   Basic Metabolic Panel:  Recent Labs Lab 07/24/15 1551 07/25/15 0430  NA 140 137  K 4.6 4.2  CL 102 102  CO2 28 27  GLUCOSE 108* 134*  BUN 50* 37*  CREATININE 1.64* 1.29*  CALCIUM 9.2 8.7*  MG  --  2.1   GFR: Estimated Creatinine Clearance: 36.8 mL/min (by C-G formula based on Cr of 1.29). Liver Function Tests:  Recent Labs Lab 07/24/15 1551  AST 31  ALT 17  ALKPHOS 80  BILITOT 0.9  PROT  6.5  ALBUMIN 3.3*   No results for input(s): LIPASE, AMYLASE in the last 168 hours. No results for input(s): AMMONIA in the last 168 hours. Coagulation Profile: No results for input(s): INR, PROTIME in the last 168 hours. Cardiac Enzymes:  Recent Labs Lab 07/24/15 1551  CKTOTAL 650*   BNP (last 3 results) No results for input(s): PROBNP in the last 8760 hours. HbA1C: No results for input(s): HGBA1C in the last 72 hours. CBG:  Recent Labs Lab 07/25/15 1425  GLUCAP 115*   Lipid Profile: No results for input(s): CHOL, HDL, LDLCALC, TRIG, CHOLHDL, LDLDIRECT in the last 72 hours. Thyroid Function Tests: No results for  input(s): TSH, T4TOTAL, FREET4, T3FREE, THYROIDAB in the last 72 hours. Anemia Panel: No results for input(s): VITAMINB12, FOLATE, FERRITIN, TIBC, IRON, RETICCTPCT in the last 72 hours. Sepsis Labs: No results for input(s): PROCALCITON, LATICACIDVEN in the last 168 hours.  No results found for this or any previous visit (from the past 240 hour(s)).       Radiology Studies: Dg Swallowing Func-speech Pathology  07/27/2015  Objective Swallowing Evaluation: Type of Study: Bedside Swallow Evaluation Patient Details Name: Richard Moody MRN: [id number] Date of Birth: 09-21-1927 Today's Date: 07/27/2015 Time: SLP Start Time (ACUTE ONLY): 1055-SLP Stop Time (ACUTE ONLY): 1117 SLP Time Calculation (min) (ACUTE ONLY): 22 min Past Medical History: Past Medical History Diagnosis Date . Ischemic cardiomyopathy    EF 32% . OA (osteoarthritis) of knee  . Ganglion cyst of wrist    LEFT WRIST . MI, old    INFERIOR LATERAL . Hyperlipidemia  . GERD (gastroesophageal reflux disease)  . ICD (implantable cardiac defibrillator) in place August 2010 . Hx of CABG 1992 . S/P cardiac cath 2006   Occluded SVG to OM. Managed medically . Normal nuclear stress test June 2010   No ischemia. EF 32% . Vitamin D deficiency  . Pre-diabetes  Past Surgical History: Past Surgical History Procedure Laterality Date . Cardiac catheterization  10/14/2004   THERE IS POSTERIOR LATERAL HYPOKINESIA. EF 30-35% . Coronary artery bypass graft  1992 . Knee surgery     RIGHT KNEE . Cardiac defibrillator placement  10/2008 . Inguinal hernia repair   . Cardiac catheterization  2006   OCCLUDED SVG TO OM . Nuclear stress test  2010   EF 32%. No ischemia HPI: 82 yo male adm to [facility] with fall - PMH + for dysphagia x at least 29 years requiring esophageal diliatation.  Pt also with weight loss recently .  RN reports pt had difficulties swallowing last pm causing him to expectorate and cough.  CXR negative 5/23 and CT head negative 5/23.  Pt is a full code and  reports his goal is for swallowing to improve.   Subjective: pt awake in bed Assessment / Plan / Recommendation CHL IP CLINICAL IMPRESSIONS 07/27/2015 Therapy Diagnosis Mild oral phase dysphagia Clinical Impression MIld oral dysphagia c/b lingual rocking due to weakness/discoordination resulting in delayed oral transiting and oral residuals of liquids that prematurely spill into pharynx.  Pharyngeal swallow was strong and timely surprisingly with only mild residuals with cracker that pt sensed.  Reflexive dry swallows cleared residual.  Pt able to orally transit tablet with thin and it readily entered into stomach - radiologist not present to confirm.  Appearance of hiatal hernia present for which pt has already been diagnosed. Due to pt's weakness, chronic dysphagia, recommend pills with purees and softer diet.  Will follow up x1 due to dysphagia to assure tolerance,  education.  Using live video, educated pt/spouse to compensations.   Based on these findings suspect pharyngeal sensation residuals is due to referrant from distal esophagus due to known hiatal hernia.   Impact on safety and function Mild aspiration risk   CHL IP TREATMENT RECOMMENDATION 07/27/2015 Treatment Recommendations Therapy as outlined in treatment plan below   Prognosis 07/27/2015 Prognosis for Safe Diet Advancement Good Barriers to Reach Goals Severity of deficits Barriers/Prognosis Comment chronicity of deficit CHL IP DIET RECOMMENDATION 07/27/2015 SLP Diet Recommendations Dysphagia 3 (Mech soft) solids;Thin liquid Liquid Administration via Cup;Straw Medication Administration Whole meds with puree Compensations Slow rate;Small sips/bites Postural Changes Seated upright at 90 degrees;Remain semi-upright after after feeds/meals (Comment)   CHL IP OTHER RECOMMENDATIONS 07/27/2015 Recommended Consults -- Oral Care Recommendations Oral care BID Other Recommendations Have oral suction available   No flowsheet data found.  CHL IP FREQUENCY AND DURATION  07/27/2015 Speech Therapy Frequency (ACUTE ONLY) min 1 x/week Treatment Duration 1 week      CHL IP ORAL PHASE 07/27/2015 Oral Phase Impaired Oral - Pudding Teaspoon -- Oral - Pudding Cup -- Oral - Honey Teaspoon -- Oral - Honey Cup -- Oral - Nectar Teaspoon -- Oral - Nectar Cup Lingual pumping;Weak lingual manipulation Oral - Nectar Straw -- Oral - Thin Teaspoon -- Oral - Thin Cup Weak lingual manipulation;Lingual pumping;Lingual/palatal residue Oral - Thin Straw Weak lingual manipulation;Lingual pumping;Lingual/palatal residue Oral - Puree Weak lingual manipulation;Lingual pumping Oral - Mech Soft Weak lingual manipulation;Lingual pumping Oral - Regular -- Oral - Multi-Consistency -- Oral - Pill Weak lingual manipulation;Lingual pumping Oral Phase - Comment --  CHL IP PHARYNGEAL PHASE 07/27/2015 Pharyngeal Phase WFL Pharyngeal- Pudding Teaspoon -- Pharyngeal -- Pharyngeal- Pudding Cup -- Pharyngeal -- Pharyngeal- Honey Teaspoon -- Pharyngeal -- Pharyngeal- Honey Cup -- Pharyngeal -- Pharyngeal- Nectar Teaspoon -- Pharyngeal -- Pharyngeal- Nectar Cup -- Pharyngeal -- Pharyngeal- Nectar Straw -- Pharyngeal -- Pharyngeal- Thin Teaspoon -- Pharyngeal -- Pharyngeal- Thin Cup -- Pharyngeal -- Pharyngeal- Thin Straw -- Pharyngeal -- Pharyngeal- Puree -- Pharyngeal -- Pharyngeal- Mechanical Soft -- Pharyngeal -- Pharyngeal- Regular -- Pharyngeal -- Pharyngeal- Multi-consistency -- Pharyngeal -- Pharyngeal- Pill -- Pharyngeal -- Pharyngeal Comment --  CHL IP CERVICAL ESOPHAGEAL PHASE 07/27/2015 Cervical Esophageal Phase WFL Pudding Teaspoon -- Pudding Cup -- Honey Teaspoon -- Honey Cup -- Nectar Teaspoon -- Nectar Cup -- Nectar Straw -- Thin Teaspoon -- Thin Cup -- Thin Straw -- Puree -- Mechanical Soft -- Regular -- Multi-consistency -- Pill -- Cervical Esophageal Comment -- Luanna Salk, MS [facility] SLP [phone]                   Scheduled Meds: . amiodarone  200 mg Oral Daily  . aspirin EC  81 mg Oral Once per day on  Mon Wed Fri  . clobetasol cream   Topical BID  . darifenacin  7.5 mg Oral Daily  . enoxaparin (LOVENOX) injection  30 mg Subcutaneous Q24H  . ferrous sulfate  325 mg Oral Daily  . guaiFENesin  600 mg Oral BID  . magnesium oxide  400 mg Oral BID  . metoprolol succinate  25 mg Oral Daily  . multivitamin with minerals  1 tablet Oral Daily  . omega-3 acid ethyl esters  1 g Oral Daily  . pantoprazole  40 mg Oral Daily  . sodium chloride flush  3 mL Intravenous Q12H  . vitamin C  1,000 mg Oral Daily   Continuous Infusions:     LOS: 2 days  Time spent: > 35 minutes    Velvet Bathe, MD [facility] Pager [phone]  If 7PM-7AM, please contact night-coverage [email or web address] Password [email or web address] 07/27/2015, 5:59 PM

## 2015-07-28 DIAGNOSIS — W19XXXS Unspecified fall, sequela: Secondary | ICD-10-CM

## 2015-07-28 MED ORDER — METOPROLOL TARTRATE 25 MG PO TABS: 12.5000 mg | ORAL_TABLET | Freq: Two times a day (BID) | ORAL | Status: DC

## 2015-07-28 NOTE — Clinical Social Work Note (Signed)
CSW did not see discharge order until late today. [facility] not calling back about admission tonight. [facility] says they do not do weekend admissions. [facility] willing to take patient in the morning. CSW paged MD letting him know that SNFs will not be able to take patient tonight. Family aware.  Dayton Scrape, [place]

## 2015-07-28 NOTE — Clinical Social Work Placement (Signed)
   CLINICAL SOCIAL WORK PLACEMENT  NOTE  Date:  07/28/2015  Patient Details  Name: Richard Moody MRN: [id number] Date of Birth: August 22, 1927  Clinical Social Work is seeking post-discharge placement for this patient at the [place] level of care (*CSW will initial, date and re-position this form in  chart as items are completed):  Yes   Patient/family provided with [place] Work Department's list of facilities offering this level of care within the geographic area requested by the patient (or if unable, by the patient's family).  Yes   Patient/family informed of their freedom to choose among providers that offer the needed level of care, that participate in Medicare, Medicaid or managed care program needed by the patient, have an available bed and are willing to accept the patient.  Yes   Patient/family informed of [facility]'s ownership interest in [facility] and [facility], as well as of the fact that they are under no obligation to receive care at these facilities.  PASRR submitted to EDS on       PASRR number received on       Existing PASRR number confirmed on 07/28/15     FL2 transmitted to all facilities in geographic area requested by pt/family on 07/28/15     FL2 transmitted to all facilities within larger geographic area on       Patient informed that his/her managed care company has contracts with or will negotiate with certain facilities, including the following:            Patient/family informed of bed offers received.  Patient chooses bed at       Physician recommends and patient chooses bed at      Patient to be transferred to   on  .  Patient to be transferred to facility by       Patient family notified on   of transfer.  Name of family member notified:        PHYSICIAN       Additional Comment:    _______________________________________________ Candie Chroman, LCSW 07/28/2015, 9:54 AM

## 2015-07-28 NOTE — Clinical Social Work Note (Signed)
Clinical Social Work Assessment  Patient Details  Name: Richard Moody MRN: [id number] Date of Birth: 1928/02/17  Date of referral:  07/28/15               Reason for consult:  Facility Placement, Discharge Planning                Permission sought to share information with:  Facility Sport and exercise psychologist, Family Supports Permission granted to share information::  Yes, Verbal Permission Granted  Name::     Medical illustrator::  SNF's  Relationship::  Wife  Contact Information:  [phone]  Housing/Transportation Living arrangements for the past 2 months:  Single Family Home Source of Information:  Patient, Spouse, Medical Team Patient Interpreter Needed:  None Criminal Activity/Legal Involvement Pertinent to Current Situation/Hospitalization:  No - Comment as needed Significant Relationships:  Spouse, Friend, Other Family Members Lives with:  Spouse Do you feel safe going back to the place where you live?  Yes Need for family participation in patient care:  Yes (Comment)  Care giving concerns:  PT recommending SNF. Patient and wife now agreeable.   Social Worker assessment / plan:  CSW met with patient again. Wife and friend at bedside. CSW had met with patient earlier this past week but they declined SNF and wanted home health. After working with PT again, they have decided to pursue SNF. Patient has been at [facility] in [place] in the past. Patient's wife gave verbal permission to fax out to other SNF's in the area as backup. Patient will need PTAR. No further concerns. CSW encouraged patient's wife to contact CSW as needed. CSW will continue to follow patient and his family for support and facilitate discharge to SNF once medically stable.  Employment status:  Retired Forensic scientist:  Medicare PT Recommendations:  [place] / Referral to community resources:  [place]  Patient/Family's Response to care:  Patient and wife  agreeable to SNF placement. Patient's wife, friends, and other family members are supportive and involved in patient's care. Patient and his wife polite and appreciated social work intervention.  Patient/Family's Understanding of and Emotional Response to Diagnosis, Current Treatment, and Prognosis:  Patient's wife knowledgeable of medical interventions and aware of severity of dependence with mobility. Now willing to pursue SNF after seeing him work with PT further. Patient and his wife aware of recommendation for SNF once medically stable for discharge.  Emotional Assessment Appearance:  Appears stated age Attitude/Demeanor/Rapport:   (Pleasant) Affect (typically observed):  Accepting, Appropriate, Calm, Happy, Pleasant Orientation:  Oriented to Self, Oriented to  Time, Oriented to Situation Alcohol / Substance use:  Never Used Psych involvement (Current and /or in the community):  No (Comment)  Discharge Needs  Concerns to be addressed:  Care Coordination Readmission within the last 30 days:  No Current discharge risk:  Dependent with Mobility Barriers to Discharge:  No Barriers Identified   Candie Chroman, LCSW 07/28/2015, 9:49 AM

## 2015-07-28 NOTE — Discharge Summary (Addendum)
Physician Discharge Summary  Richard Moody [id number] DOB: October 30, 1927 DOA: 07/24/2015  PCP: Richard Richards, MD  Admit date: 07/24/2015 Discharge date: 07/28/2015  Time spent: > 35 minutes  Recommendations for Outpatient Follow-up:  1. Monitor blood pressures and adjust BP medications accordingly 2. D/c'd lasix on d/c due to dehydration 3. Discontinued Xanax at night secondary to altered mental status upon initial presentation   Discharge Diagnoses:  Principal Problem:   Falls Active Problems:   Automatic implantable cardioverter-defibrillator in situ   Essential hypertension   UTI (lower urinary tract infection)   Chronic combined systolic and diastolic congestive heart failure [facility])   Discharge Condition: stable  Diet recommendation: Dysphagia 3  Filed Weights   07/26/15 0443 07/27/15 0538 07/28/15 0444  Weight: 62.097 kg (136 lb 14.4 oz) 62.188 kg (137 lb 1.6 oz) 60.691 kg (133 lb 12.8 oz)    History of present illness:  82 y.o. male with medical history significant of ischemic cardiomyopathy with combined CHF last EF noted to be 35-40% in [name] 1 diastolic dysfunction, s/p AICD, CAD, HLD, GERD; who presents after being found down  Hospital Course:  Principal Problem:  Falls - Pt recommending SNF placement.  - d/c to SNF once bed able  Active Problems:  Automatic implantable cardioverter-defibrillator in situ  AKI secondary to dehydration - resolving. S creatinine trended down on last check   Essential hypertension  UTI (lower urinary tract infection) - No complaints related to UTI, d/c'd rocephin   Chronic combined systolic and diastolic congestive heart failure ([place]) - Continue current medication regimen  Procedures:  None  Consultations:  None  Discharge Exam: Filed Vitals:   07/28/15 0444 07/28/15 0956  BP: 128/62 97/50  Pulse: 59 59  Temp: 98 F (36.7 C)   Resp: 16     General: Pt in nad, alert and awake Cardiovascular:  s1 and s2 present, no rubs Respiratory: no increased wob, no wheezes  Discharge Instructions   Discharge Instructions    Call MD for:  persistant dizziness or light-headedness    Complete by:  As directed      Call MD for:  temperature >100.4    Complete by:  As directed      Diet - low sodium heart healthy    Complete by:  As directed      Discharge instructions    Complete by:  As directed   Please decide when to continue Lasix. Also discontinue patient's as needed Xanax at night. Given that patient was admitted with altered mental status I will not continue Xanax on discharge     Increase activity slowly    Complete by:  As directed           Current Discharge Medication List    START taking these medications   Details  metoprolol tartrate (LOPRESSOR) 25 MG tablet Take 0.5 tablets (12.5 mg total) by mouth 2 (two) times daily.      CONTINUE these medications which have NOT CHANGED   Details  amiodarone (PACERONE) 200 MG tablet Take 1 tablet (200 mg total) by mouth daily. Qty: 10 tablet, Refills: 0    aspirin 81 MG tablet Take 81 mg by mouth 3 (three) times a week.      Cholecalciferol (VITAMIN D3) 5000 UNITS CAPS Take 2 capsules by mouth daily.      darifenacin (ENABLEX) 7.5 MG 24 hr tablet Take 7.5 mg by mouth daily.    diclofenac sodium (VOLTAREN) 1 % GEL Apply 2 g topically  4 (four) times daily. Qty: 100 g, Refills: 0    esomeprazole (NEXIUM) 40 MG capsule Take 1 capsule by mouth daily.    fish oil-omega-3 fatty acids 1000 MG capsule Take 2 g by mouth daily.      glucosamine-chondroitin 500-400 MG tablet Take 1 tablet by mouth 2 (two) times daily.      IRON PO Take 45 mg by mouth daily.     Magnesium 250 MG TABS Take 1 tablet by mouth 2 (two) times daily.     Multiple Minerals-Vitamins (CITRACAL PLUS PO) Take 1 capsule by mouth daily.     OVER THE COUNTER MEDICATION Take 1 tablet by mouth daily. claritin 10 mg daily for allergies PRN    polyethylene glycol  (MIRALAX / GLYCOLAX) packet Take 17 g by mouth as needed for moderate constipation.     vitamin C (ASCORBIC ACID) 500 MG tablet Take 1,000 mg by mouth daily.        STOP taking these medications     ALPRAZolam (XANAX) 1 MG tablet      fexofenadine-pseudoephedrine (ALLEGRA-D) 60-120 MG per tablet      furosemide (LASIX) 40 MG tablet      halobetasol (ULTRAVATE) 0.05 % cream      metoprolol succinate (TOPROL-XL) 25 MG 24 hr tablet      quinapril (ACCUPRIL) 5 MG tablet      Red Yeast Rice Extract (RED YEAST RICE PO)        Allergies  Allergen Reactions  . Lipitor [Atorvastatin]     Elevated cpk   . Mobic [Meloxicam] Other (See Comments)    edema  . Nsaids Other (See Comments)    dyspepsia      The results of significant diagnostics from this hospitalization (including imaging, microbiology, ancillary and laboratory) are listed below for reference.    Significant Diagnostic Studies: Dg Chest 2 View  07/24/2015  CLINICAL DATA:  Altered mental status and generalize weakness with fatigue. Found on floor after 24 hours. EXAM: CHEST  2 VIEW COMPARISON:  09/01/2014 FINDINGS: Sternotomy wire and left-sided pacemaker unchanged. Diffuse decreased bone mineralization is present. Lungs are adequately inflated without focal consolidation or effusion. Cardiomediastinal silhouette is within normal. There is a moderate size hiatal hernia present. There are degenerative changes of the spine with stable moderate compression fracture in the region of the thoracolumbar junction as well as mild loss of height of several additional thoracic vertebral bodies unchanged. IMPRESSION: No acute cardiopulmonary disease. Stable compression fracture at the thoracolumbar junction and stable loss of height of several additional thoracic vertebrae. Electronically Signed   By: Marin Olp M.D.   On: 07/24/2015 17:19   Ct Head Wo Contrast  07/24/2015  CLINICAL DATA:  Fall this morning. Generalized weakness. No  loss of consciousness. Initial encounter. EXAM: CT HEAD WITHOUT CONTRAST TECHNIQUE: Contiguous axial images were obtained from the base of the skull through the vertex without intravenous contrast. COMPARISON:  10/30/2006 FINDINGS: Mild generalized cerebral atrophy is unchanged. Subcortical and periventricular cerebral white matter hypodensities are similar to the prior study and nonspecific but compatible with mild chronic small vessel ischemic disease. There is no evidence of acute cortical infarct, intracranial hemorrhage, mass, midline shift, or extra-axial fluid collection. Prior bilateral cataract extraction is noted. There is evidence of chronic bilateral maxillary sinusitis with increased, near complete opacification on the left. Mastoid air cells are clear. Calcified atherosclerosis is noted at the skullbase. No skull fracture is identified. IMPRESSION: 1. No evidence of acute intracranial  abnormality. 2. Mild chronic small vessel ischemic disease and cerebral atrophy. Electronically Signed   By: Logan Bores M.D.   On: 07/24/2015 16:52   Dg Swallowing Func-speech Pathology  07/27/2015  Objective Swallowing Evaluation: Type of Study: Bedside Swallow Evaluation Patient Details Name: DAXTIN DESNOYERS MRN: [id number] Date of Birth: April 05, 1927 Today's Date: 07/27/2015 Time: SLP Start Time (ACUTE ONLY): 1055-SLP Stop Time (ACUTE ONLY): 1117 SLP Time Calculation (min) (ACUTE ONLY): 22 min Past Medical History: Past Medical History Diagnosis Date . Ischemic cardiomyopathy    EF 32% . OA (osteoarthritis) of knee  . Ganglion cyst of wrist    LEFT WRIST . MI, old    INFERIOR LATERAL . Hyperlipidemia  . GERD (gastroesophageal reflux disease)  . ICD (implantable cardiac defibrillator) in place August 2010 . Hx of CABG 1992 . S/P cardiac cath 2006   Occluded SVG to OM. Managed medically . Normal nuclear stress test June 2010   No ischemia. EF 32% . Vitamin D deficiency  . Pre-diabetes  Past Surgical History: Past  Surgical History Procedure Laterality Date . Cardiac catheterization  10/14/2004   THERE IS POSTERIOR LATERAL HYPOKINESIA. EF 30-35% . Coronary artery bypass graft  1992 . Knee surgery     RIGHT KNEE . Cardiac defibrillator placement  10/2008 . Inguinal hernia repair   . Cardiac catheterization  2006   OCCLUDED SVG TO OM . Nuclear stress test  2010   EF 32%. No ischemia HPI: 82 yo male adm to [facility] with fall - PMH + for dysphagia x at least 29 years requiring esophageal diliatation.  Pt also with weight loss recently .  RN reports pt had difficulties swallowing last pm causing him to expectorate and cough.  CXR negative 5/23 and CT head negative 5/23.  Pt is a full code and reports his goal is for swallowing to improve.   Subjective: pt awake in bed Assessment / Plan / Recommendation CHL IP CLINICAL IMPRESSIONS 07/27/2015 Therapy Diagnosis Mild oral phase dysphagia Clinical Impression MIld oral dysphagia c/b lingual rocking due to weakness/discoordination resulting in delayed oral transiting and oral residuals of liquids that prematurely spill into pharynx.  Pharyngeal swallow was strong and timely surprisingly with only mild residuals with cracker that pt sensed.  Reflexive dry swallows cleared residual.  Pt able to orally transit tablet with thin and it readily entered into stomach - radiologist not present to confirm.  Appearance of hiatal hernia present for which pt has already been diagnosed. Due to pt's weakness, chronic dysphagia, recommend pills with purees and softer diet.  Will follow up x1 due to dysphagia to assure tolerance, education.  Using live video, educated pt/spouse to compensations.   Based on these findings suspect pharyngeal sensation residuals is due to referrant from distal esophagus due to known hiatal hernia.   Impact on safety and function Mild aspiration risk   CHL IP TREATMENT RECOMMENDATION 07/27/2015 Treatment Recommendations Therapy as outlined in treatment plan below   Prognosis 07/27/2015  Prognosis for Safe Diet Advancement Good Barriers to Reach Goals Severity of deficits Barriers/Prognosis Comment chronicity of deficit CHL IP DIET RECOMMENDATION 07/27/2015 SLP Diet Recommendations Dysphagia 3 (Mech soft) solids;Thin liquid Liquid Administration via Cup;Straw Medication Administration Whole meds with puree Compensations Slow rate;Small sips/bites Postural Changes Seated upright at 90 degrees;Remain semi-upright after after feeds/meals (Comment)   CHL IP OTHER RECOMMENDATIONS 07/27/2015 Recommended Consults -- Oral Care Recommendations Oral care BID Other Recommendations Have oral suction available   No flowsheet data found.  CHL IP  FREQUENCY AND DURATION 07/27/2015 Speech Therapy Frequency (ACUTE ONLY) min 1 x/week Treatment Duration 1 week      CHL IP ORAL PHASE 07/27/2015 Oral Phase Impaired Oral - Pudding Teaspoon -- Oral - Pudding Cup -- Oral - Honey Teaspoon -- Oral - Honey Cup -- Oral - Nectar Teaspoon -- Oral - Nectar Cup Lingual pumping;Weak lingual manipulation Oral - Nectar Straw -- Oral - Thin Teaspoon -- Oral - Thin Cup Weak lingual manipulation;Lingual pumping;Lingual/palatal residue Oral - Thin Straw Weak lingual manipulation;Lingual pumping;Lingual/palatal residue Oral - Puree Weak lingual manipulation;Lingual pumping Oral - Mech Soft Weak lingual manipulation;Lingual pumping Oral - Regular -- Oral - Multi-Consistency -- Oral - Pill Weak lingual manipulation;Lingual pumping Oral Phase - Comment --  CHL IP PHARYNGEAL PHASE 07/27/2015 Pharyngeal Phase WFL Pharyngeal- Pudding Teaspoon -- Pharyngeal -- Pharyngeal- Pudding Cup -- Pharyngeal -- Pharyngeal- Honey Teaspoon -- Pharyngeal -- Pharyngeal- Honey Cup -- Pharyngeal -- Pharyngeal- Nectar Teaspoon -- Pharyngeal -- Pharyngeal- Nectar Cup -- Pharyngeal -- Pharyngeal- Nectar Straw -- Pharyngeal -- Pharyngeal- Thin Teaspoon -- Pharyngeal -- Pharyngeal- Thin Cup -- Pharyngeal -- Pharyngeal- Thin Straw -- Pharyngeal -- Pharyngeal- Puree --  Pharyngeal -- Pharyngeal- Mechanical Soft -- Pharyngeal -- Pharyngeal- Regular -- Pharyngeal -- Pharyngeal- Multi-consistency -- Pharyngeal -- Pharyngeal- Pill -- Pharyngeal -- Pharyngeal Comment --  CHL IP CERVICAL ESOPHAGEAL PHASE 07/27/2015 Cervical Esophageal Phase WFL Pudding Teaspoon -- Pudding Cup -- Honey Teaspoon -- Honey Cup -- Nectar Teaspoon -- Nectar Cup -- Nectar Straw -- Thin Teaspoon -- Thin Cup -- Thin Straw -- Puree -- Mechanical Soft -- Regular -- Multi-consistency -- Pill -- Cervical Esophageal Comment -- Luanna Salk, MS [facility] SLP [phone]               Microbiology: No results found for this or any previous visit (from the past 240 hour(s)).   Labs: Basic Metabolic Panel:  Recent Labs Lab 07/24/15 1551 07/25/15 0430  NA 140 137  K 4.6 4.2  CL 102 102  CO2 28 27  GLUCOSE 108* 134*  BUN 50* 37*  CREATININE 1.64* 1.29*  CALCIUM 9.2 8.7*  MG  --  2.1   Liver Function Tests:  Recent Labs Lab 07/24/15 1551  AST 31  ALT 17  ALKPHOS 80  BILITOT 0.9  PROT 6.5  ALBUMIN 3.3*   No results for input(s): LIPASE, AMYLASE in the last 168 hours. No results for input(s): AMMONIA in the last 168 hours. CBC:  Recent Labs Lab 07/24/15 1551 07/25/15 0430  WBC 5.7 6.9  HGB 10.7* 10.4*  HCT 33.2* 32.2*  MCV 93.5 93.3  PLT 206 186   Cardiac Enzymes:  Recent Labs Lab 07/24/15 1551  CKTOTAL 650*   BNP: BNP (last 3 results) No results for input(s): BNP in the last 8760 hours.  ProBNP (last 3 results) No results for input(s): PROBNP in the last 8760 hours.  CBG:  Recent Labs Lab 07/25/15 1425  GLUCAP 115*    Signed:  Velvet Bathe MD.  [facility] 07/28/2015, 2:00 PM   Addendum: Pt stable for discharge 07/29/15  Velvet Bathe

## 2015-07-28 NOTE — NC FL2 (Signed)
[place] LEVEL OF CARE SCREENING TOOL     IDENTIFICATION  Patient Name: Richard Moody [id number] Birthdate: 1927/09/19 Sex: male Admission Date (Current Location): 07/24/2015  [facility] and [place] Number:  Herbalist and Address:  [facility] [place], [place] [address] [place], [place] [address]      Provider Number: [id number]  Attending Physician Name and Address:  Velvet Bathe, MD  Relative Name and Phone Number:       Current Level of Care: Hospital Recommended Level of Care: [place] Prior Approval Number:    Date Approved/Denied:   PASRR Number: [id number]  Discharge Plan: SNF    Current Diagnoses: Patient Active Problem List   Diagnosis Date Noted  . Chronic combined systolic and diastolic congestive heart failure ([place]) 07/25/2015  . UTI (lower urinary tract infection) 07/24/2015  . Falls 07/24/2015  . Neurodermatitis 11/02/2014  . OAB (overactive bladder) 09/27/2014  . GERD  06/20/2014  . Hyperkalemia (Hyporeninemic HypoAldosteronism) 06/20/2014  . Medication management 12/20/2013  . Hyperlipidemia 09/05/2013  . Essential hypertension 02/27/2013  . Prediabetes 02/27/2013  . Vitamin D deficiency 02/27/2013  . Obstructive chronic bronchitis without exacerbation ([place]) 02/27/2013  . Chronic systolic CHF (EF 123456) [name]  . Automatic implantable cardioverter-defibrillator in situ 04/08/2010  . ASCAD s/p CABG (1992) 09/29/2008    Orientation RESPIRATION BLADDER Height & Weight     Self, Time, Situation  Normal Incontinent Weight: 133 lb 12.8 oz (60.691 kg) (scale c) Height:  5\' 11"  (180.3 cm)  BEHAVIORAL SYMPTOMS/MOOD NEUROLOGICAL BOWEL NUTRITION STATUS   (None)  (None) Continent Diet (DYS 3)  AMBULATORY STATUS COMMUNICATION OF NEEDS Skin   Limited Assist Verbally Other (Comment) (Echhymosis right upper back)                       Personal Care Assistance Level of Assistance  Bathing, Feeding,  Dressing Bathing Assistance: Limited assistance Feeding assistance: Independent Dressing Assistance: Limited assistance     Functional Limitations Info  Sight, Hearing, Speech Sight Info: Adequate Hearing Info: Adequate Speech Info: Adequate    SPECIAL CARE FACTORS FREQUENCY  Blood pressure, PT (By licensed PT)     PT Frequency: 5 x week              Contractures Contractures Info: Not present    Additional Factors Info  Code Status, Allergies Code Status Info: Full Allergies Info: Lipitor, Mobic, Nsaids           Current Medications (07/28/2015):  This is the current hospital active medication list Current Facility-Administered Medications  Medication Dose Route Frequency Provider Last Rate Last Dose  . acetaminophen (TYLENOL) tablet 650 mg  650 mg Oral Q6H PRN Norval Morton, MD       Or  . acetaminophen (TYLENOL) suppository 650 mg  650 mg Rectal Q6H PRN Norval Morton, MD      . albuterol (PROVENTIL) (2.5 MG/3ML) 0.083% nebulizer solution 2.5 mg  2.5 mg Nebulization Q2H PRN Norval Morton, MD      . ALPRAZolam Duanne Moron) tablet 0.5-1 mg  0.5-1 mg Oral TID PRN Norval Morton, MD   0.5 mg at 07/27/15 2201  . amiodarone (PACERONE) tablet 200 mg  200 mg Oral Daily Norval Morton, MD   200 mg at 07/27/15 1739  . aspirin EC tablet 81 mg  81 mg Oral Once per day on Mon Wed Fri Norval Morton, MD   81 mg at  07/27/15 1555  . clobetasol cream (TEMOVATE) 0.05 %   Topical BID Norval Morton, MD      . darifenacin (ENABLEX) 24 hr tablet 7.5 mg  7.5 mg Oral Daily Norval Morton, MD   7.5 mg at 07/28/15 0958  . diclofenac sodium (VOLTAREN) 1 % transdermal gel 2 g  2 g Topical QID PRN Norval Morton, MD      . enoxaparin (LOVENOX) injection 30 mg  30 mg Subcutaneous Q24H Norval Morton, MD   30 mg at 07/28/15 0956  . ferrous sulfate tablet 325 mg  325 mg Oral Daily Norval Morton, MD   325 mg at 07/28/15 0958  . guaiFENesin (MUCINEX) 12 hr tablet 600 mg  600 mg Oral BID  Norval Morton, MD   600 mg at 07/28/15 0956  . magnesium oxide (MAG-OX) tablet 400 mg  400 mg Oral BID Norval Morton, MD   400 mg at 07/27/15 2201  . metoprolol succinate (TOPROL-XL) 24 hr tablet 25 mg  25 mg Oral Daily Norval Morton, MD   25 mg at 07/28/15 0956  . multivitamin with minerals tablet 1 tablet  1 tablet Oral Daily Norval Morton, MD   1 tablet at 07/28/15 0956  . omega-3 acid ethyl esters (LOVAZA) capsule 1 g  1 g Oral Daily Rondell Charmayne Sheer, MD   1 g at 07/27/15 1553  . ondansetron (ZOFRAN) tablet 4 mg  4 mg Oral Q6H PRN Norval Morton, MD       Or  . ondansetron (ZOFRAN) injection 4 mg  4 mg Intravenous Q6H PRN Rondell A Tamala Julian, MD      . pantoprazole (PROTONIX) EC tablet 40 mg  40 mg Oral Daily Norval Morton, MD   40 mg at 07/28/15 0956  . polyethylene glycol (MIRALAX / GLYCOLAX) packet 17 g  17 g Oral Daily PRN Norval Morton, MD      . sodium chloride flush (NS) 0.9 % injection 3 mL  3 mL Intravenous Q12H Norval Morton, MD   3 mL at 07/27/15 2202  . vitamin C (ASCORBIC ACID) tablet 1,000 mg  1,000 mg Oral Daily Norval Morton, MD   1,000 mg at 07/28/15 [id number]     Discharge Medications: Please see discharge summary for a list of discharge medications.  Relevant Imaging Results:  Relevant Lab Results:   Additional Information SS#: [id number]  Candie Chroman, LCSW

## 2015-07-29 DIAGNOSIS — R41841 Cognitive communication deficit: Secondary | ICD-10-CM | POA: Diagnosis not present

## 2015-07-29 DIAGNOSIS — I5042 Chronic combined systolic (congestive) and diastolic (congestive) heart failure: Secondary | ICD-10-CM | POA: Diagnosis not present

## 2015-07-29 DIAGNOSIS — I2510 Atherosclerotic heart disease of native coronary artery without angina pectoris: Secondary | ICD-10-CM | POA: Diagnosis not present

## 2015-07-29 DIAGNOSIS — J188 Other pneumonia, unspecified organism: Secondary | ICD-10-CM | POA: Diagnosis not present

## 2015-07-29 DIAGNOSIS — I10 Essential (primary) hypertension: Secondary | ICD-10-CM | POA: Diagnosis not present

## 2015-07-29 DIAGNOSIS — I252 Old myocardial infarction: Secondary | ICD-10-CM | POA: Diagnosis not present

## 2015-07-29 DIAGNOSIS — R1311 Dysphagia, oral phase: Secondary | ICD-10-CM | POA: Diagnosis not present

## 2015-07-29 DIAGNOSIS — R5381 Other malaise: Secondary | ICD-10-CM | POA: Diagnosis not present

## 2015-07-29 DIAGNOSIS — R259 Unspecified abnormal involuntary movements: Secondary | ICD-10-CM | POA: Diagnosis not present

## 2015-07-29 DIAGNOSIS — M546 Pain in thoracic spine: Secondary | ICD-10-CM | POA: Diagnosis not present

## 2015-07-29 DIAGNOSIS — I255 Ischemic cardiomyopathy: Secondary | ICD-10-CM | POA: Diagnosis not present

## 2015-07-29 DIAGNOSIS — M545 Low back pain: Secondary | ICD-10-CM | POA: Diagnosis not present

## 2015-07-29 DIAGNOSIS — R509 Fever, unspecified: Secondary | ICD-10-CM | POA: Diagnosis not present

## 2015-07-29 DIAGNOSIS — E860 Dehydration: Secondary | ICD-10-CM | POA: Diagnosis not present

## 2015-07-29 DIAGNOSIS — R2681 Unsteadiness on feet: Secondary | ICD-10-CM | POA: Diagnosis not present

## 2015-07-29 DIAGNOSIS — R296 Repeated falls: Secondary | ICD-10-CM | POA: Diagnosis not present

## 2015-07-29 DIAGNOSIS — Z95810 Presence of automatic (implantable) cardiac defibrillator: Secondary | ICD-10-CM | POA: Diagnosis not present

## 2015-07-29 DIAGNOSIS — I5022 Chronic systolic (congestive) heart failure: Secondary | ICD-10-CM | POA: Diagnosis not present

## 2015-07-29 DIAGNOSIS — K5909 Other constipation: Secondary | ICD-10-CM | POA: Diagnosis not present

## 2015-07-29 DIAGNOSIS — R531 Weakness: Secondary | ICD-10-CM | POA: Diagnosis not present

## 2015-07-29 DIAGNOSIS — N390 Urinary tract infection, site not specified: Secondary | ICD-10-CM | POA: Diagnosis not present

## 2015-07-29 DIAGNOSIS — R2689 Other abnormalities of gait and mobility: Secondary | ICD-10-CM | POA: Diagnosis not present

## 2015-07-29 DIAGNOSIS — N179 Acute kidney failure, unspecified: Secondary | ICD-10-CM | POA: Diagnosis not present

## 2015-07-29 DIAGNOSIS — R600 Localized edema: Secondary | ICD-10-CM | POA: Diagnosis not present

## 2015-07-29 DIAGNOSIS — I493 Ventricular premature depolarization: Secondary | ICD-10-CM | POA: Diagnosis not present

## 2015-07-29 DIAGNOSIS — M6281 Muscle weakness (generalized): Secondary | ICD-10-CM | POA: Diagnosis not present

## 2015-07-29 DIAGNOSIS — R269 Unspecified abnormalities of gait and mobility: Secondary | ICD-10-CM | POA: Diagnosis not present

## 2015-07-29 NOTE — Discharge Summary (Addendum)
Pt got discharged to [facility], IV taken off, and tele-monitor DC, discharge instructions provided to the pt's wife on the bed side and report to the receiving  nurse on the [facility], pt left via ambulance with all of her belongings.

## 2015-07-29 NOTE — Progress Notes (Signed)
Patient will DC to: [facility] Anticipated DC date: 07/29/15 Family notified: Spouse at bedside Transport by: PTAR 2:30pm   Per MD patient ready for DC to [facility]. RN, patient, patient's family, and facility notified of DC. RN given number for report. DC packet on chart. Ambulance transport requested for patient.   CSW signing off.  Cedric Fishman, [place] Social Worker [phone]

## 2015-07-29 NOTE — Clinical Social Work Placement (Signed)
   CLINICAL SOCIAL WORK PLACEMENT  NOTE  Date:  07/29/2015  Patient Details  Name: Richard Moody MRN: [id number] Date of Birth: 16-Jul-1927  Clinical Social Work is seeking post-discharge placement for this patient at the [place] level of care (*CSW will initial, date and re-position this form in  chart as items are completed):  Yes   Patient/family provided with [facility] Work Department's list of facilities offering this level of care within the geographic area requested by the patient (or if unable, by the patient's family).  Yes   Patient/family informed of their freedom to choose among providers that offer the needed level of care, that participate in Medicare, Medicaid or managed care program needed by the patient, have an available bed and are willing to accept the patient.  Yes   Patient/family informed of [facility]'s ownership interest in [facility] and [facility], as well as of the fact that they are under no obligation to receive care at these facilities.  PASRR submitted to EDS on       PASRR number received on       Existing PASRR number confirmed on 07/28/15     FL2 transmitted to all facilities in geographic area requested by pt/family on 07/28/15     FL2 transmitted to all facilities within larger geographic area on       Patient informed that his/her managed care company has contracts with or will negotiate with certain facilities, including the following:        Yes   Patient/family informed of bed offers received.  Patient chooses bed at [place], [place]     Physician recommends and patient chooses bed at      Patient to be transferred to [place] on 07/29/15.  Patient to be transferred to facility by [facility]     Patient family notified on 07/29/15 of transfer.  Name of family member notified:  Spouse     PHYSICIAN       Additional Comment:     _______________________________________________ Benard Halsted, [place] 07/29/2015, 12:46 PM

## 2015-08-03 ENCOUNTER — Ambulatory Visit (INDEPENDENT_AMBULATORY_CARE_PROVIDER_SITE_OTHER): Payer: Medicare Other | Admitting: Internal Medicine

## 2015-08-03 ENCOUNTER — Encounter: Payer: Self-pay | Admitting: Internal Medicine

## 2015-08-03 VITALS — BP 122/56 | HR 63 | Ht 71.0 in | Wt 147.0 lb

## 2015-08-03 DIAGNOSIS — I255 Ischemic cardiomyopathy: Secondary | ICD-10-CM | POA: Diagnosis not present

## 2015-08-03 DIAGNOSIS — I493 Ventricular premature depolarization: Secondary | ICD-10-CM

## 2015-08-03 DIAGNOSIS — I5022 Chronic systolic (congestive) heart failure: Secondary | ICD-10-CM | POA: Diagnosis not present

## 2015-08-03 DIAGNOSIS — Z95810 Presence of automatic (implantable) cardiac defibrillator: Secondary | ICD-10-CM

## 2015-08-03 LAB — CUP PACEART INCLINIC DEVICE CHECK
Brady Statistic AP VS Percent: 59.61 %
Brady Statistic AS VS Percent: 40.32 %
Brady Statistic RV Percent Paced: 0.07 %
HighPow Impedance: 39 Ohm
HighPow Impedance: 51 Ohm
Implantable Lead Implant Date: 20100805
Implantable Lead Implant Date: 20100805
Implantable Lead Location: 753860
Lead Channel Pacing Threshold Amplitude: 1 V
Lead Channel Pacing Threshold Pulse Width: 0.4 ms
Lead Channel Pacing Threshold Pulse Width: 0.4 ms
Lead Channel Setting Pacing Amplitude: 2 V
Lead Channel Setting Sensing Sensitivity: 0.3 mV
MDC IDC LEAD LOCATION: 753859
MDC IDC MSMT BATTERY VOLTAGE: 2.7 V
MDC IDC MSMT LEADCHNL RA IMPEDANCE VALUE: 475 Ohm
MDC IDC MSMT LEADCHNL RA PACING THRESHOLD AMPLITUDE: 0.75 V
MDC IDC MSMT LEADCHNL RA SENSING INTR AMPL: 1.875 mV
MDC IDC MSMT LEADCHNL RV IMPEDANCE VALUE: 418 Ohm
MDC IDC MSMT LEADCHNL RV SENSING INTR AMPL: 8.625 mV
MDC IDC SESS DTM: 20170602112510
MDC IDC SET LEADCHNL RV PACING AMPLITUDE: 2.5 V
MDC IDC SET LEADCHNL RV PACING PULSEWIDTH: 0.4 ms
MDC IDC STAT BRADY AP VP PERCENT: 0.06 %
MDC IDC STAT BRADY AS VP PERCENT: 0.01 %
MDC IDC STAT BRADY RA PERCENT PACED: 59.67 %

## 2015-08-03 NOTE — Patient Instructions (Signed)
Medication Instructions: - Your physician recommends that you continue on your current medications as directed. Please refer to the Current Medication list given to you today.  Labwork: - none  Procedures/Testing: -none  Follow-Up: - Your physician recommends that you schedule a follow-up appointment in: 3 months with [name], NP for Dr. Caryl Comes (will we call you to arrange this appointment).  Any Additional Special Instructions Will Be Listed Below (If Applicable).     If you need a refill on your cardiac medications before your next appointment, please call your pharmacy.

## 2015-08-03 NOTE — Progress Notes (Signed)
Patient Care Team: Unk Pinto, MD as PCP - General (Internal Medicine) Unk Pinto, MD (Internal Medicine) Darleen Crocker, MD as Consulting Physician (Ophthalmology) Deboraha Sprang, MD as Consulting Physician (Cardiology)   HPI  Richard Moody is a 82 y.o. male seen in followup for ICD implanted in 2010.  He has a history of ischemic heart disease with prior bypass surgery depressed left ventricular function with EFof 32%. He has  chronic systolic heart failure but has done relatively well.   He was noted to have significant PVCs that we were concerned about injury to his cardiomyopathy   He was also having problems with lower blood pressure. His Florinef was discontinued quinapril was decreased  He was seen by a S about 4 weeks ago. Blood pressure at that time was 90. Heart rate was recorded in the 40s but there were frequent PVCs.  Amiodarone was initiated  Recently hospitalized for falls   he patient has few complaints although he is not very ambulatory at this point. He recently had significant swelling in his lower extremities. This correlated with a optivol bump. This responded to an increase in his diuretics from 20--40 and wraps. His edema is largely improved. At some point his PCP also started him on Florinef for hypotension. This was in conjunction with a inhibitors and beta blockers     Past Medical History  Diagnosis Date  . Ischemic cardiomyopathy     EF 32%  . OA (osteoarthritis) of knee   . Ganglion cyst of wrist     LEFT WRIST  . MI, old     INFERIOR LATERAL  . Hyperlipidemia   . GERD (gastroesophageal reflux disease)   . ICD (implantable cardiac defibrillator) in place August 2010  . Hx of CABG 1992  . S/P cardiac cath 2006    Occluded SVG to OM. Managed medically  . Normal nuclear stress test June 2010    No ischemia. EF 32%  . Vitamin D deficiency   . Pre-diabetes     Past Surgical History  Procedure Laterality Date  . Cardiac  catheterization  10/14/2004    THERE IS POSTERIOR LATERAL HYPOKINESIA. EF 30-35%  . Coronary artery bypass graft  1992  . Knee surgery      RIGHT KNEE  . Cardiac defibrillator placement  10/2008  . Inguinal hernia repair    . Cardiac catheterization  2006    OCCLUDED SVG TO OM  . Nuclear stress test  2010    EF 32%. No ischemia    Current Outpatient Prescriptions  Medication Sig Dispense Refill  . acetaminophen (TYLENOL) 500 MG tablet Take 500 mg by mouth as directed.    Marland Kitchen amiodarone (PACERONE) 200 MG tablet Take 1 tablet (200 mg total) by mouth daily. 10 tablet 0  . aspirin 81 MG tablet Take 81 mg by mouth 3 (three) times a week.      . Cholecalciferol (VITAMIN D3) 5000 UNITS CAPS Take 2 capsules by mouth daily.      Marland Kitchen darifenacin (ENABLEX) 7.5 MG 24 hr tablet Take 7.5 mg by mouth daily.    Marland Kitchen esomeprazole (NEXIUM) 40 MG capsule Take 1 capsule by mouth daily.    . fish oil-omega-3 fatty acids 1000 MG capsule Take 2 g by mouth daily.      Marland Kitchen glucosamine-chondroitin 500-400 MG tablet Take 1 tablet by mouth 2 (two) times daily.      . IRON PO Take 45 mg by mouth daily.     Marland Kitchen  loratadine (CLARITIN) 10 MG tablet Take 10 mg by mouth daily.    . Magnesium 250 MG TABS Take 1 tablet by mouth 2 (two) times daily.     . metoprolol succinate (TOPROL-XL) 25 MG 24 hr tablet Take 25 tablets by mouth at bedtime. Pt takes 12.5 mg daily at bedtime    . Multiple Minerals-Vitamins (CITRACAL PLUS PO) Take 1 capsule by mouth daily.     Marland Kitchen OVER THE COUNTER MEDICATION Take 1 tablet by mouth daily. claritin 10 mg daily for allergies PRN    . polyethylene glycol (MIRALAX / GLYCOLAX) packet Take 17 g by mouth as needed for moderate constipation.     . vitamin C (ASCORBIC ACID) 500 MG tablet Take 1,000 mg by mouth daily.       No current facility-administered medications for this visit.    Allergies  Allergen Reactions  . Lipitor [Atorvastatin]     Elevated cpk   . Mobic [Meloxicam] Other (See Comments)     edema  . Nsaids Other (See Comments)    dyspepsia    Review of Systems negative except from HPI and PMH  Physical Exam BP 122/56 mmHg  Pulse 63  Ht 5\' 11"  (1.803 m)  Wt 147 lb (66.679 kg)  BMI 20.51 kg/m2  SpO2 98% Well developed and cachectic older man in no acute distress HENT normal Neck supple with JVP-flat Clear Device pocket well healed; without hematoma or erythema.  There is no tethering  Regular rate and rhythm, no murmurs or gallops Abd-soft with active BS No Clubbing cyanosis edema Skin-warm and dry A & Oriented  Grossly normal sensory and motor function  ECG Apacing  Prior IMI LVH repol   Assessment and  Plan  Ischemic cardiomyopathy Without symptoms of ischemia  Congestive heart failure-chronic-systolic  Euvolemic continue current meds  Implantable  defibrillator-[employer]  The patient's device was interrogated.  The information was reviewed. No changes were made in the programming.    PVCs      His PVC burden is significantly less at least inferred from the second tracings that we have. We'll continue him on the amiodarone. His blood pressure also much improved on the lower doses of medications; according to class his recovery is going well. We will hold his medications as they are currently.  His optivol is of some concern. We'll continue him right now on his diuretics use them on a when necessary basis.   According to his wife he is getting some kind of subcutaneous volume infusion???

## 2015-08-05 DIAGNOSIS — M545 Low back pain: Secondary | ICD-10-CM | POA: Diagnosis not present

## 2015-08-05 DIAGNOSIS — J188 Other pneumonia, unspecified organism: Secondary | ICD-10-CM | POA: Diagnosis not present

## 2015-08-05 DIAGNOSIS — R600 Localized edema: Secondary | ICD-10-CM | POA: Diagnosis not present

## 2015-08-08 ENCOUNTER — Telehealth: Payer: Self-pay | Admitting: Cardiology

## 2015-08-08 ENCOUNTER — Telehealth: Payer: Self-pay

## 2015-08-08 NOTE — Telephone Encounter (Signed)
Attempted call to wife, at [phone] to reschedule ICM remote transmission since patient is current an inpatient in Rehab.  Left message for return call.  Next ICM remote scheduled for 08/22/2015.

## 2015-08-08 NOTE — Telephone Encounter (Signed)
Spoke w/ pt daughter and she informed me that pt is in a nursing home temporally and that he will be unable to remote transmission today. She stated that she would let her mom know. ICM clinic nurse to going to call pt wife and reschedule today remote transmission.

## 2015-08-13 ENCOUNTER — Telehealth: Payer: Self-pay

## 2015-08-13 NOTE — Telephone Encounter (Signed)
Returned call to wife as requested to [phone].  Her voice mail message stated patient continues to be rehab inpatient for another week.

## 2015-08-13 NOTE — Telephone Encounter (Signed)
Received call back from wife, Richard Moody.  She stated patient will be discharged in about 1 week from rehab.  She stated she will need to find some assistance at home because she will not be able to handle all his care by herself.   Advised her to call back when patient is discharged and a remote transmission can be sent when he gets home.

## 2015-08-18 DIAGNOSIS — I493 Ventricular premature depolarization: Secondary | ICD-10-CM | POA: Diagnosis not present

## 2015-08-18 DIAGNOSIS — I255 Ischemic cardiomyopathy: Secondary | ICD-10-CM | POA: Diagnosis not present

## 2015-08-18 DIAGNOSIS — I5022 Chronic systolic (congestive) heart failure: Secondary | ICD-10-CM | POA: Diagnosis not present

## 2015-08-18 DIAGNOSIS — I110 Hypertensive heart disease with heart failure: Secondary | ICD-10-CM | POA: Diagnosis not present

## 2015-08-18 DIAGNOSIS — I872 Venous insufficiency (chronic) (peripheral): Secondary | ICD-10-CM | POA: Diagnosis not present

## 2015-08-18 DIAGNOSIS — J449 Chronic obstructive pulmonary disease, unspecified: Secondary | ICD-10-CM | POA: Diagnosis not present

## 2015-08-20 DIAGNOSIS — I493 Ventricular premature depolarization: Secondary | ICD-10-CM | POA: Diagnosis not present

## 2015-08-20 DIAGNOSIS — I255 Ischemic cardiomyopathy: Secondary | ICD-10-CM | POA: Diagnosis not present

## 2015-08-20 DIAGNOSIS — I5022 Chronic systolic (congestive) heart failure: Secondary | ICD-10-CM | POA: Diagnosis not present

## 2015-08-20 DIAGNOSIS — I110 Hypertensive heart disease with heart failure: Secondary | ICD-10-CM | POA: Diagnosis not present

## 2015-08-20 DIAGNOSIS — I872 Venous insufficiency (chronic) (peripheral): Secondary | ICD-10-CM | POA: Diagnosis not present

## 2015-08-20 DIAGNOSIS — J449 Chronic obstructive pulmonary disease, unspecified: Secondary | ICD-10-CM | POA: Diagnosis not present

## 2015-08-21 DIAGNOSIS — J449 Chronic obstructive pulmonary disease, unspecified: Secondary | ICD-10-CM | POA: Diagnosis not present

## 2015-08-21 DIAGNOSIS — I255 Ischemic cardiomyopathy: Secondary | ICD-10-CM | POA: Diagnosis not present

## 2015-08-21 DIAGNOSIS — I493 Ventricular premature depolarization: Secondary | ICD-10-CM | POA: Diagnosis not present

## 2015-08-21 DIAGNOSIS — I5022 Chronic systolic (congestive) heart failure: Secondary | ICD-10-CM | POA: Diagnosis not present

## 2015-08-21 DIAGNOSIS — I110 Hypertensive heart disease with heart failure: Secondary | ICD-10-CM | POA: Diagnosis not present

## 2015-08-21 DIAGNOSIS — I872 Venous insufficiency (chronic) (peripheral): Secondary | ICD-10-CM | POA: Diagnosis not present

## 2015-08-22 ENCOUNTER — Ambulatory Visit (INDEPENDENT_AMBULATORY_CARE_PROVIDER_SITE_OTHER): Payer: Medicare Other

## 2015-08-22 DIAGNOSIS — Z95810 Presence of automatic (implantable) cardiac defibrillator: Secondary | ICD-10-CM | POA: Diagnosis not present

## 2015-08-22 DIAGNOSIS — I5022 Chronic systolic (congestive) heart failure: Secondary | ICD-10-CM | POA: Diagnosis not present

## 2015-08-22 DIAGNOSIS — I255 Ischemic cardiomyopathy: Secondary | ICD-10-CM | POA: Diagnosis not present

## 2015-08-22 DIAGNOSIS — I872 Venous insufficiency (chronic) (peripheral): Secondary | ICD-10-CM | POA: Diagnosis not present

## 2015-08-22 DIAGNOSIS — I110 Hypertensive heart disease with heart failure: Secondary | ICD-10-CM | POA: Diagnosis not present

## 2015-08-22 DIAGNOSIS — J449 Chronic obstructive pulmonary disease, unspecified: Secondary | ICD-10-CM | POA: Diagnosis not present

## 2015-08-22 DIAGNOSIS — I493 Ventricular premature depolarization: Secondary | ICD-10-CM | POA: Diagnosis not present

## 2015-08-22 MED ORDER — FUROSEMIDE 40 MG PO TABS: 40.0000 mg | ORAL_TABLET | ORAL | Status: DC | PRN

## 2015-08-22 NOTE — Progress Notes (Signed)
EPIC Encounter for ICM Monitoring  Patient Name: Richard Moody is a 82 y.o. male Date: 08/22/2015 Primary Care Physican: Alesia Richards, MD Primary Cardiologist: Caryl Comes Electrophysiologist: Caryl Comes Dry Weight: 127 lbs       In the past month, have you:  1. Gained more than 2 pounds in a day or more than 5 pounds in a week? No  2. Had changes in your medications (with verification of current medications)? Yes, many changes at time of discharge from hospital and rehab.  Furosemide was discontinued.    3. Had more shortness of breath than is usual for you? No   4. Limited your activity because of shortness of breath? No   5. Not been able to sleep because of shortness of breath? No   6. Had increased swelling in your feet, ankles, legs or stomach area? No   7. Had symptoms of dehydration (dizziness, dry mouth, increased thirst, decreased urine output) No   8. Had changes in sodium restriction? No   9. Been compliant with medication? Yes   ICM trend: 3 month view for 08/22/2015   ICM trend: 1 year view for 08/22/2015   Follow-up plan: ICM clinic phone appointment 09/25/2015.  Spoke with patient and wife together.  FLUID LEVELS:  Optivol thoracic impedance above reference line since 08/08/2015 suggesting slight dryness.    SYMPTOMS:   He denied any fluid symptoms such as weight gain of 3 pounds overnight or 5 pounds within a week, SOB and/or lower extremity swelling.  Patient was discharged from rehab within the last week and receiving OT/PT at home.  He stated he feels ok at this time and denied any falls since returning home.  Encouraged to call for any fluid symptoms.   RECOMMENDATIONS: No changes today.    Reviewed ICM transmission with Dr Caryl Comes and discussed if Furosemide dose is needed prn.  He ordered Furosemide 40 mg prn for fluid symptoms.  No Furosemide needed at this time.      Rosalene Billings, RN, CCM 08/22/2015 12:54 PM

## 2015-08-23 DIAGNOSIS — I5022 Chronic systolic (congestive) heart failure: Secondary | ICD-10-CM | POA: Diagnosis not present

## 2015-08-23 DIAGNOSIS — J449 Chronic obstructive pulmonary disease, unspecified: Secondary | ICD-10-CM | POA: Diagnosis not present

## 2015-08-23 DIAGNOSIS — I872 Venous insufficiency (chronic) (peripheral): Secondary | ICD-10-CM | POA: Diagnosis not present

## 2015-08-23 DIAGNOSIS — I493 Ventricular premature depolarization: Secondary | ICD-10-CM | POA: Diagnosis not present

## 2015-08-23 DIAGNOSIS — I255 Ischemic cardiomyopathy: Secondary | ICD-10-CM | POA: Diagnosis not present

## 2015-08-23 DIAGNOSIS — I110 Hypertensive heart disease with heart failure: Secondary | ICD-10-CM | POA: Diagnosis not present

## 2015-08-24 DIAGNOSIS — I5022 Chronic systolic (congestive) heart failure: Secondary | ICD-10-CM | POA: Diagnosis not present

## 2015-08-24 DIAGNOSIS — J449 Chronic obstructive pulmonary disease, unspecified: Secondary | ICD-10-CM | POA: Diagnosis not present

## 2015-08-24 DIAGNOSIS — I110 Hypertensive heart disease with heart failure: Secondary | ICD-10-CM | POA: Diagnosis not present

## 2015-08-24 DIAGNOSIS — I872 Venous insufficiency (chronic) (peripheral): Secondary | ICD-10-CM | POA: Diagnosis not present

## 2015-08-24 DIAGNOSIS — I255 Ischemic cardiomyopathy: Secondary | ICD-10-CM | POA: Diagnosis not present

## 2015-08-24 DIAGNOSIS — I493 Ventricular premature depolarization: Secondary | ICD-10-CM | POA: Diagnosis not present

## 2015-08-27 DIAGNOSIS — I493 Ventricular premature depolarization: Secondary | ICD-10-CM | POA: Diagnosis not present

## 2015-08-27 DIAGNOSIS — I110 Hypertensive heart disease with heart failure: Secondary | ICD-10-CM | POA: Diagnosis not present

## 2015-08-27 DIAGNOSIS — J449 Chronic obstructive pulmonary disease, unspecified: Secondary | ICD-10-CM | POA: Diagnosis not present

## 2015-08-27 DIAGNOSIS — I872 Venous insufficiency (chronic) (peripheral): Secondary | ICD-10-CM | POA: Diagnosis not present

## 2015-08-27 DIAGNOSIS — I5022 Chronic systolic (congestive) heart failure: Secondary | ICD-10-CM | POA: Diagnosis not present

## 2015-08-27 DIAGNOSIS — I255 Ischemic cardiomyopathy: Secondary | ICD-10-CM | POA: Diagnosis not present

## 2015-08-28 DIAGNOSIS — I5022 Chronic systolic (congestive) heart failure: Secondary | ICD-10-CM | POA: Diagnosis not present

## 2015-08-28 DIAGNOSIS — I493 Ventricular premature depolarization: Secondary | ICD-10-CM | POA: Diagnosis not present

## 2015-08-28 DIAGNOSIS — J449 Chronic obstructive pulmonary disease, unspecified: Secondary | ICD-10-CM | POA: Diagnosis not present

## 2015-08-28 DIAGNOSIS — I255 Ischemic cardiomyopathy: Secondary | ICD-10-CM | POA: Diagnosis not present

## 2015-08-28 DIAGNOSIS — I872 Venous insufficiency (chronic) (peripheral): Secondary | ICD-10-CM | POA: Diagnosis not present

## 2015-08-28 DIAGNOSIS — I110 Hypertensive heart disease with heart failure: Secondary | ICD-10-CM | POA: Diagnosis not present

## 2015-08-29 DIAGNOSIS — I5022 Chronic systolic (congestive) heart failure: Secondary | ICD-10-CM | POA: Diagnosis not present

## 2015-08-29 DIAGNOSIS — I493 Ventricular premature depolarization: Secondary | ICD-10-CM | POA: Diagnosis not present

## 2015-08-29 DIAGNOSIS — J449 Chronic obstructive pulmonary disease, unspecified: Secondary | ICD-10-CM | POA: Diagnosis not present

## 2015-08-29 DIAGNOSIS — I255 Ischemic cardiomyopathy: Secondary | ICD-10-CM | POA: Diagnosis not present

## 2015-08-29 DIAGNOSIS — I872 Venous insufficiency (chronic) (peripheral): Secondary | ICD-10-CM | POA: Diagnosis not present

## 2015-08-29 DIAGNOSIS — I110 Hypertensive heart disease with heart failure: Secondary | ICD-10-CM | POA: Diagnosis not present

## 2015-08-30 DIAGNOSIS — I255 Ischemic cardiomyopathy: Secondary | ICD-10-CM | POA: Diagnosis not present

## 2015-08-30 DIAGNOSIS — I110 Hypertensive heart disease with heart failure: Secondary | ICD-10-CM | POA: Diagnosis not present

## 2015-08-30 DIAGNOSIS — J449 Chronic obstructive pulmonary disease, unspecified: Secondary | ICD-10-CM | POA: Diagnosis not present

## 2015-08-30 DIAGNOSIS — I872 Venous insufficiency (chronic) (peripheral): Secondary | ICD-10-CM | POA: Diagnosis not present

## 2015-08-30 DIAGNOSIS — I493 Ventricular premature depolarization: Secondary | ICD-10-CM | POA: Diagnosis not present

## 2015-08-30 DIAGNOSIS — I5022 Chronic systolic (congestive) heart failure: Secondary | ICD-10-CM | POA: Diagnosis not present

## 2015-08-31 ENCOUNTER — Ambulatory Visit (INDEPENDENT_AMBULATORY_CARE_PROVIDER_SITE_OTHER): Payer: Medicare Other | Admitting: Internal Medicine

## 2015-08-31 ENCOUNTER — Encounter: Payer: Self-pay | Admitting: Internal Medicine

## 2015-08-31 VITALS — BP 112/58 | HR 68 | Temp 97.5°F | Resp 16 | Ht 71.0 in | Wt 133.0 lb

## 2015-08-31 DIAGNOSIS — I10 Essential (primary) hypertension: Secondary | ICD-10-CM

## 2015-08-31 DIAGNOSIS — I255 Ischemic cardiomyopathy: Secondary | ICD-10-CM | POA: Diagnosis not present

## 2015-08-31 DIAGNOSIS — Z79899 Other long term (current) drug therapy: Secondary | ICD-10-CM

## 2015-08-31 DIAGNOSIS — R7303 Prediabetes: Secondary | ICD-10-CM | POA: Diagnosis not present

## 2015-08-31 LAB — CBC WITH DIFFERENTIAL/PLATELET
BASOS ABS: 58 {cells}/uL (ref 0–200)
Basophils Relative: 1 %
EOS PCT: 2 %
Eosinophils Absolute: 116 cells/uL (ref 15–500)
HCT: 33.2 % — ABNORMAL LOW (ref 38.5–50.0)
HEMOGLOBIN: 11 g/dL — AB (ref 13.2–17.1)
LYMPHS ABS: 1044 {cells}/uL (ref 850–3900)
LYMPHS PCT: 18 %
MCH: 30.5 pg (ref 27.0–33.0)
MCHC: 33.1 g/dL (ref 32.0–36.0)
MCV: 92 fL (ref 80.0–100.0)
MONOS PCT: 12 %
MPV: 9.4 fL (ref 7.5–12.5)
Monocytes Absolute: 696 cells/uL (ref 200–950)
NEUTROS PCT: 67 %
Neutro Abs: 3886 cells/uL (ref 1500–7800)
Platelets: 305 10*3/uL (ref 140–400)
RBC: 3.61 MIL/uL — AB (ref 4.20–5.80)
RDW: 14.6 % (ref 11.0–15.0)
WBC: 5.8 10*3/uL (ref 3.8–10.8)

## 2015-08-31 LAB — HEPATIC FUNCTION PANEL
ALT: 9 U/L (ref 9–46)
AST: 14 U/L (ref 10–35)
Albumin: 4 g/dL (ref 3.6–5.1)
Alkaline Phosphatase: 97 U/L (ref 40–115)
BILIRUBIN DIRECT: 0.1 mg/dL (ref <=0.2)
Indirect Bilirubin: 0.4 mg/dL (ref 0.2–1.2)
TOTAL PROTEIN: 6.5 g/dL (ref 6.1–8.1)
Total Bilirubin: 0.5 mg/dL (ref 0.2–1.2)

## 2015-08-31 LAB — TSH: TSH: 3.77 m[IU]/L (ref 0.40–4.50)

## 2015-08-31 LAB — BASIC METABOLIC PANEL WITH GFR
BUN: 27 mg/dL — ABNORMAL HIGH (ref 7–25)
CALCIUM: 9.3 mg/dL (ref 8.6–10.3)
CO2: 24 mmol/L (ref 20–31)
CREATININE: 1.01 mg/dL (ref 0.70–1.11)
Chloride: 96 mmol/L — ABNORMAL LOW (ref 98–110)
GFR, EST AFRICAN AMERICAN: 78 mL/min (ref >=60)
GFR, EST NON AFRICAN AMERICAN: 68 mL/min (ref >=60)
Glucose, Bld: 93 mg/dL (ref 65–99)
Potassium: 5.6 mmol/L — ABNORMAL HIGH (ref 3.5–5.3)
SODIUM: 131 mmol/L — AB (ref 135–146)

## 2015-08-31 LAB — MAGNESIUM: MAGNESIUM: 2.2 mg/dL (ref 1.5–2.5)

## 2015-09-01 ENCOUNTER — Other Ambulatory Visit: Payer: Self-pay | Admitting: Internal Medicine

## 2015-09-01 ENCOUNTER — Encounter: Payer: Self-pay | Admitting: Internal Medicine

## 2015-09-01 DIAGNOSIS — Z79899 Other long term (current) drug therapy: Secondary | ICD-10-CM

## 2015-09-01 DIAGNOSIS — I10 Essential (primary) hypertension: Secondary | ICD-10-CM

## 2015-09-01 DIAGNOSIS — E875 Hyperkalemia: Secondary | ICD-10-CM

## 2015-09-01 NOTE — Progress Notes (Signed)
Subjective:    Patient ID: Richard Moody, male    DOB: 07/02/1927, 82 y.o.   MRN: [id number]  HPI  Patient is a very nice 82 yo MWM with failure to thrive and concomitant labile HTN with dysautonomia & postural hypotension, ASHD/CABG/ Congestive Cardiomyopathy, COPD, mild SDAT, Hyperkalemia who was recently discovered lying on the floor about 8 hours and was hospitalized 5/23-28 and he was felt dehydrated due to a UTI. Then he was subsequently transferred to [facility] for LPT for strengthening & conditioning and was there about 2&1/2 weeks til June 16th and now has been home about 2 weeks. Patient's wife reports that he is taking diet & fluids in sparse amounts. He is ambulating with a walker , but apparently is not very active at home. 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   Medication Sig  . acetaminophen  500 MG tablet Take 500 mg by mouth as directed.  Marland Kitchen amiodarone  200 MG tablet Take 1 tablet (200 mg total) by mouth daily.  Marland Kitchen aspirin 81 MG tablet Take 81 mg by mouth 3 (three) times a week.    Marland Kitchen VITAMIN D  5000 UNITS Take 2 capsules by mouth daily.    Marland Kitchen esomeprazole  40 MG Take 1 capsule by mouth daily.  . fish oil-omega-3  1000 MG  Take 2 g by mouth daily.    Marland Kitchen glucosamine-chondroitin  Take 1 tablet by mouth 2 (two) times daily.    . IRON PO Take 45 mg by mouth daily.   . Magnesium 250 MG  Take 1 tablet by mouth 2 (two) times daily.   . metoprolol succinate-XL 25 MG  Take 25 tablets by mouth at bedtime. Pt takes 12.5 mg daily at bedtime  . Multiple Minerals-Vitamins Take 1 capsule by mouth daily.   Marland Kitchen MIRALAX  packet Take 17 g by mouth as needed for moderate constipation.   . vitamin C 500 MG  Take 1,000 mg by mouth daily.    Marland Kitchen darifenacin (ENABLEX) 7.5 MG  Take 7.5 mg by  mouth daily  . furosemide (LASIX) 40 MG Patient not taking: Reported on 08/31/2015)  . loratadine 10 MG  Take 10 mg by mouth daily.   Allergies  Allergen Reactions  . Lipitor [Atorvastatin]     Elevated cpk   . Mobic [Meloxicam] Other (See Comments)    edema  . Nsaids Other (See Comments)    dyspepsia   Past Medical History  Diagnosis Date  . Ischemic cardiomyopathy     EF 32%  . OA (osteoarthritis) of knee   . Ganglion cyst of wrist     LEFT WRIST  . MI, old     INFERIOR LATERAL  . Hyperlipidemia   . GERD (gastroesophageal reflux disease)   . ICD (implantable cardiac defibrillator) in place August 2010  . Hx of CABG 1992  . S/P cardiac cath 2006    Occluded SVG to OM. Managed medically  . Normal nuclear stress test June 2010    No ischemia. EF 32%  . Vitamin D deficiency   . Pre-diabetes    Review of Systems  10 point systems review negative except as above.   Objective:   Physical Exam  BP 112/58 & drops to 88/56 standing   Pulse 68  Temp  97.5 F  Resp 16  Ht 5\' 11"   Wt 133 lb     BMI 18.56  Chronically ill appearing emaciated elderly white man in no acute distress.  HEENT - Eac's patent. TM's Nl. EOM's full. PERRLA. NasoOroPharynx clear. Neck - supple. Nl Thyroid. Carotids 2+ & No bruits, nodes, JVD Chest - Barrel Chested Clear equal BS w/o Rales, rhonchi, wheezes. Cor - Nl HS. RRR w/o sig MGR. PP 1(+). No edema. Abd - Scaphoid w/o palpable organomegaly, masses or tenderness. BS nl. MS- FROM w/o deformities. Muscle power, tone and bulk decreased. Gait supported by a walker. Neuro - No obvious Cr N abnormalities. Sensory, motor and Cerebellar functions appear Nl w/o focal abnormalities. Psyche - Mental status - with flat affected and limited insight.     Assessment & Plan:   1. Essential hypertension  - TSH  2. Prediabetes   3. ASCAD s/p CABG (1992)  4. Medication management - CBC with Differential/Platelet - BASIC METABOLIC PANEL WITH GFR -  Hepatic function panel - Magnesium - TSH

## 2015-09-03 DIAGNOSIS — I872 Venous insufficiency (chronic) (peripheral): Secondary | ICD-10-CM | POA: Diagnosis not present

## 2015-09-03 DIAGNOSIS — I5022 Chronic systolic (congestive) heart failure: Secondary | ICD-10-CM | POA: Diagnosis not present

## 2015-09-03 DIAGNOSIS — I255 Ischemic cardiomyopathy: Secondary | ICD-10-CM | POA: Diagnosis not present

## 2015-09-03 DIAGNOSIS — I493 Ventricular premature depolarization: Secondary | ICD-10-CM | POA: Diagnosis not present

## 2015-09-03 DIAGNOSIS — I110 Hypertensive heart disease with heart failure: Secondary | ICD-10-CM | POA: Diagnosis not present

## 2015-09-03 DIAGNOSIS — J449 Chronic obstructive pulmonary disease, unspecified: Secondary | ICD-10-CM | POA: Diagnosis not present

## 2015-09-05 DIAGNOSIS — I255 Ischemic cardiomyopathy: Secondary | ICD-10-CM | POA: Diagnosis not present

## 2015-09-05 DIAGNOSIS — I872 Venous insufficiency (chronic) (peripheral): Secondary | ICD-10-CM | POA: Diagnosis not present

## 2015-09-05 DIAGNOSIS — J449 Chronic obstructive pulmonary disease, unspecified: Secondary | ICD-10-CM | POA: Diagnosis not present

## 2015-09-05 DIAGNOSIS — I493 Ventricular premature depolarization: Secondary | ICD-10-CM | POA: Diagnosis not present

## 2015-09-05 DIAGNOSIS — I110 Hypertensive heart disease with heart failure: Secondary | ICD-10-CM | POA: Diagnosis not present

## 2015-09-05 DIAGNOSIS — I5022 Chronic systolic (congestive) heart failure: Secondary | ICD-10-CM | POA: Diagnosis not present

## 2015-09-06 DIAGNOSIS — I493 Ventricular premature depolarization: Secondary | ICD-10-CM | POA: Diagnosis not present

## 2015-09-06 DIAGNOSIS — I255 Ischemic cardiomyopathy: Secondary | ICD-10-CM | POA: Diagnosis not present

## 2015-09-06 DIAGNOSIS — I5022 Chronic systolic (congestive) heart failure: Secondary | ICD-10-CM | POA: Diagnosis not present

## 2015-09-06 DIAGNOSIS — I872 Venous insufficiency (chronic) (peripheral): Secondary | ICD-10-CM | POA: Diagnosis not present

## 2015-09-06 DIAGNOSIS — I110 Hypertensive heart disease with heart failure: Secondary | ICD-10-CM | POA: Diagnosis not present

## 2015-09-06 DIAGNOSIS — J449 Chronic obstructive pulmonary disease, unspecified: Secondary | ICD-10-CM | POA: Diagnosis not present

## 2015-09-07 ENCOUNTER — Telehealth: Payer: Self-pay | Admitting: *Deleted

## 2015-09-07 DIAGNOSIS — I493 Ventricular premature depolarization: Secondary | ICD-10-CM | POA: Diagnosis not present

## 2015-09-07 DIAGNOSIS — I110 Hypertensive heart disease with heart failure: Secondary | ICD-10-CM | POA: Diagnosis not present

## 2015-09-07 DIAGNOSIS — I255 Ischemic cardiomyopathy: Secondary | ICD-10-CM | POA: Diagnosis not present

## 2015-09-07 DIAGNOSIS — I5022 Chronic systolic (congestive) heart failure: Secondary | ICD-10-CM | POA: Diagnosis not present

## 2015-09-07 DIAGNOSIS — J449 Chronic obstructive pulmonary disease, unspecified: Secondary | ICD-10-CM | POA: Diagnosis not present

## 2015-09-07 DIAGNOSIS — I872 Venous insufficiency (chronic) (peripheral): Secondary | ICD-10-CM | POA: Diagnosis not present

## 2015-09-07 NOTE — Telephone Encounter (Signed)
[facility] called and requested PT 2 times a week for 4 weeks starting the week of 09/10/2015.  Per Dr Melford Aase, it is OK to start the PT.  Richard Moody is aware.

## 2015-09-09 DIAGNOSIS — R296 Repeated falls: Secondary | ICD-10-CM | POA: Diagnosis not present

## 2015-09-09 DIAGNOSIS — L89151 Pressure ulcer of sacral region, stage 1: Secondary | ICD-10-CM | POA: Diagnosis not present

## 2015-09-09 DIAGNOSIS — I872 Venous insufficiency (chronic) (peripheral): Secondary | ICD-10-CM | POA: Diagnosis not present

## 2015-09-09 DIAGNOSIS — R2689 Other abnormalities of gait and mobility: Secondary | ICD-10-CM | POA: Diagnosis not present

## 2015-09-09 DIAGNOSIS — Z7982 Long term (current) use of aspirin: Secondary | ICD-10-CM | POA: Diagnosis not present

## 2015-09-09 DIAGNOSIS — I110 Hypertensive heart disease with heart failure: Secondary | ICD-10-CM | POA: Diagnosis not present

## 2015-09-09 DIAGNOSIS — I255 Ischemic cardiomyopathy: Secondary | ICD-10-CM | POA: Diagnosis not present

## 2015-09-09 DIAGNOSIS — Z95810 Presence of automatic (implantable) cardiac defibrillator: Secondary | ICD-10-CM | POA: Diagnosis not present

## 2015-09-09 DIAGNOSIS — M6281 Muscle weakness (generalized): Secondary | ICD-10-CM | POA: Diagnosis not present

## 2015-09-09 DIAGNOSIS — Z87440 Personal history of urinary (tract) infections: Secondary | ICD-10-CM | POA: Diagnosis not present

## 2015-09-09 DIAGNOSIS — J449 Chronic obstructive pulmonary disease, unspecified: Secondary | ICD-10-CM | POA: Diagnosis not present

## 2015-09-09 DIAGNOSIS — I5042 Chronic combined systolic (congestive) and diastolic (congestive) heart failure: Secondary | ICD-10-CM | POA: Diagnosis not present

## 2015-09-11 ENCOUNTER — Other Ambulatory Visit: Payer: Self-pay | Admitting: *Deleted

## 2015-09-11 DIAGNOSIS — L89151 Pressure ulcer of sacral region, stage 1: Secondary | ICD-10-CM | POA: Diagnosis not present

## 2015-09-11 DIAGNOSIS — I255 Ischemic cardiomyopathy: Secondary | ICD-10-CM | POA: Diagnosis not present

## 2015-09-11 DIAGNOSIS — J449 Chronic obstructive pulmonary disease, unspecified: Secondary | ICD-10-CM | POA: Diagnosis not present

## 2015-09-11 DIAGNOSIS — I110 Hypertensive heart disease with heart failure: Secondary | ICD-10-CM | POA: Diagnosis not present

## 2015-09-11 DIAGNOSIS — I5042 Chronic combined systolic (congestive) and diastolic (congestive) heart failure: Secondary | ICD-10-CM | POA: Diagnosis not present

## 2015-09-11 DIAGNOSIS — R2689 Other abnormalities of gait and mobility: Secondary | ICD-10-CM | POA: Diagnosis not present

## 2015-09-11 MED ORDER — TRAMADOL HCL 50 MG PO TABS: 50.0000 mg | ORAL_TABLET | Freq: Four times a day (QID) | ORAL | Status: DC | PRN

## 2015-09-13 DIAGNOSIS — I110 Hypertensive heart disease with heart failure: Secondary | ICD-10-CM | POA: Diagnosis not present

## 2015-09-13 DIAGNOSIS — I255 Ischemic cardiomyopathy: Secondary | ICD-10-CM | POA: Diagnosis not present

## 2015-09-13 DIAGNOSIS — I5042 Chronic combined systolic (congestive) and diastolic (congestive) heart failure: Secondary | ICD-10-CM | POA: Diagnosis not present

## 2015-09-13 DIAGNOSIS — R2689 Other abnormalities of gait and mobility: Secondary | ICD-10-CM | POA: Diagnosis not present

## 2015-09-13 DIAGNOSIS — J449 Chronic obstructive pulmonary disease, unspecified: Secondary | ICD-10-CM | POA: Diagnosis not present

## 2015-09-13 DIAGNOSIS — L89151 Pressure ulcer of sacral region, stage 1: Secondary | ICD-10-CM | POA: Diagnosis not present

## 2015-09-17 DIAGNOSIS — I5042 Chronic combined systolic (congestive) and diastolic (congestive) heart failure: Secondary | ICD-10-CM | POA: Diagnosis not present

## 2015-09-17 DIAGNOSIS — J449 Chronic obstructive pulmonary disease, unspecified: Secondary | ICD-10-CM | POA: Diagnosis not present

## 2015-09-17 DIAGNOSIS — R2689 Other abnormalities of gait and mobility: Secondary | ICD-10-CM | POA: Diagnosis not present

## 2015-09-17 DIAGNOSIS — I110 Hypertensive heart disease with heart failure: Secondary | ICD-10-CM | POA: Diagnosis not present

## 2015-09-17 DIAGNOSIS — I255 Ischemic cardiomyopathy: Secondary | ICD-10-CM | POA: Diagnosis not present

## 2015-09-17 DIAGNOSIS — L89151 Pressure ulcer of sacral region, stage 1: Secondary | ICD-10-CM | POA: Diagnosis not present

## 2015-09-19 ENCOUNTER — Ambulatory Visit (INDEPENDENT_AMBULATORY_CARE_PROVIDER_SITE_OTHER): Payer: Medicare Other

## 2015-09-19 DIAGNOSIS — E875 Hyperkalemia: Secondary | ICD-10-CM

## 2015-09-19 DIAGNOSIS — R2689 Other abnormalities of gait and mobility: Secondary | ICD-10-CM | POA: Diagnosis not present

## 2015-09-19 DIAGNOSIS — I110 Hypertensive heart disease with heart failure: Secondary | ICD-10-CM | POA: Diagnosis not present

## 2015-09-19 DIAGNOSIS — Z79899 Other long term (current) drug therapy: Secondary | ICD-10-CM

## 2015-09-19 DIAGNOSIS — I5042 Chronic combined systolic (congestive) and diastolic (congestive) heart failure: Secondary | ICD-10-CM | POA: Diagnosis not present

## 2015-09-19 DIAGNOSIS — I255 Ischemic cardiomyopathy: Secondary | ICD-10-CM | POA: Diagnosis not present

## 2015-09-19 DIAGNOSIS — I10 Essential (primary) hypertension: Secondary | ICD-10-CM | POA: Diagnosis not present

## 2015-09-19 DIAGNOSIS — J449 Chronic obstructive pulmonary disease, unspecified: Secondary | ICD-10-CM | POA: Diagnosis not present

## 2015-09-19 DIAGNOSIS — L89151 Pressure ulcer of sacral region, stage 1: Secondary | ICD-10-CM | POA: Diagnosis not present

## 2015-09-19 NOTE — Progress Notes (Signed)
Pt presents for lab only visit (BMET).

## 2015-09-20 LAB — BASIC METABOLIC PANEL WITH GFR
BUN: 23 mg/dL (ref 7–25)
CALCIUM: 9.1 mg/dL (ref 8.6–10.3)
CO2: 25 mmol/L (ref 20–31)
Chloride: 99 mmol/L (ref 98–110)
Creat: 0.93 mg/dL (ref 0.70–1.11)
GFR, EST AFRICAN AMERICAN: 86 mL/min (ref >=60)
GFR, EST NON AFRICAN AMERICAN: 75 mL/min (ref >=60)
GLUCOSE: 89 mg/dL (ref 65–99)
POTASSIUM: 4.7 mmol/L (ref 3.5–5.3)
SODIUM: 135 mmol/L (ref 135–146)

## 2015-09-21 DIAGNOSIS — J449 Chronic obstructive pulmonary disease, unspecified: Secondary | ICD-10-CM | POA: Diagnosis not present

## 2015-09-21 DIAGNOSIS — I5042 Chronic combined systolic (congestive) and diastolic (congestive) heart failure: Secondary | ICD-10-CM | POA: Diagnosis not present

## 2015-09-21 DIAGNOSIS — I110 Hypertensive heart disease with heart failure: Secondary | ICD-10-CM | POA: Diagnosis not present

## 2015-09-21 DIAGNOSIS — I255 Ischemic cardiomyopathy: Secondary | ICD-10-CM | POA: Diagnosis not present

## 2015-09-21 DIAGNOSIS — R2689 Other abnormalities of gait and mobility: Secondary | ICD-10-CM | POA: Diagnosis not present

## 2015-09-21 DIAGNOSIS — L89151 Pressure ulcer of sacral region, stage 1: Secondary | ICD-10-CM | POA: Diagnosis not present

## 2015-09-24 DIAGNOSIS — L89151 Pressure ulcer of sacral region, stage 1: Secondary | ICD-10-CM | POA: Diagnosis not present

## 2015-09-24 DIAGNOSIS — R2689 Other abnormalities of gait and mobility: Secondary | ICD-10-CM | POA: Diagnosis not present

## 2015-09-24 DIAGNOSIS — I255 Ischemic cardiomyopathy: Secondary | ICD-10-CM | POA: Diagnosis not present

## 2015-09-24 DIAGNOSIS — I5042 Chronic combined systolic (congestive) and diastolic (congestive) heart failure: Secondary | ICD-10-CM | POA: Diagnosis not present

## 2015-09-24 DIAGNOSIS — J449 Chronic obstructive pulmonary disease, unspecified: Secondary | ICD-10-CM | POA: Diagnosis not present

## 2015-09-24 DIAGNOSIS — I110 Hypertensive heart disease with heart failure: Secondary | ICD-10-CM | POA: Diagnosis not present

## 2015-09-25 ENCOUNTER — Telehealth: Payer: Self-pay

## 2015-09-25 ENCOUNTER — Ambulatory Visit (INDEPENDENT_AMBULATORY_CARE_PROVIDER_SITE_OTHER): Payer: Medicare Other

## 2015-09-25 DIAGNOSIS — I5022 Chronic systolic (congestive) heart failure: Secondary | ICD-10-CM

## 2015-09-25 DIAGNOSIS — Z95810 Presence of automatic (implantable) cardiac defibrillator: Secondary | ICD-10-CM

## 2015-09-25 NOTE — Progress Notes (Signed)
EPIC Encounter for ICM Monitoring  Patient Name: Richard Moody is a 82 y.o. male Date: 09/25/2015 Primary Care Physican: Alesia Richards, MD Primary Cardiologist: Caryl Comes Electrophysiologist: Caryl Comes Dry Weight: unknown  Attempted patient call and unable to reach.  Thoracic impedance decreased suggesting fluid accumulation starting 09/17/2015.  Repeat transmission 10/11/2015  ICM trend: 09/25/2015   Follow-up plan: ICM clinic phone appointment on 10/11/2015.  Copy of ICM check sent to device physician.   Rosalene Billings, RN 09/25/2015 11:36 AM

## 2015-09-25 NOTE — Telephone Encounter (Signed)
Remote ICM transmission received.  Attempted patient call and no voice mail option on home phone.

## 2015-09-26 DIAGNOSIS — I5042 Chronic combined systolic (congestive) and diastolic (congestive) heart failure: Secondary | ICD-10-CM | POA: Diagnosis not present

## 2015-09-26 DIAGNOSIS — J449 Chronic obstructive pulmonary disease, unspecified: Secondary | ICD-10-CM | POA: Diagnosis not present

## 2015-09-26 DIAGNOSIS — I255 Ischemic cardiomyopathy: Secondary | ICD-10-CM | POA: Diagnosis not present

## 2015-09-26 DIAGNOSIS — I110 Hypertensive heart disease with heart failure: Secondary | ICD-10-CM | POA: Diagnosis not present

## 2015-09-26 DIAGNOSIS — L89151 Pressure ulcer of sacral region, stage 1: Secondary | ICD-10-CM | POA: Diagnosis not present

## 2015-09-26 DIAGNOSIS — R2689 Other abnormalities of gait and mobility: Secondary | ICD-10-CM | POA: Diagnosis not present

## 2015-09-27 DIAGNOSIS — J449 Chronic obstructive pulmonary disease, unspecified: Secondary | ICD-10-CM | POA: Diagnosis not present

## 2015-09-27 DIAGNOSIS — R2689 Other abnormalities of gait and mobility: Secondary | ICD-10-CM | POA: Diagnosis not present

## 2015-09-27 DIAGNOSIS — I255 Ischemic cardiomyopathy: Secondary | ICD-10-CM | POA: Diagnosis not present

## 2015-09-27 DIAGNOSIS — I110 Hypertensive heart disease with heart failure: Secondary | ICD-10-CM | POA: Diagnosis not present

## 2015-09-27 DIAGNOSIS — L89151 Pressure ulcer of sacral region, stage 1: Secondary | ICD-10-CM | POA: Diagnosis not present

## 2015-09-27 DIAGNOSIS — I5042 Chronic combined systolic (congestive) and diastolic (congestive) heart failure: Secondary | ICD-10-CM | POA: Diagnosis not present

## 2015-09-28 DIAGNOSIS — I5042 Chronic combined systolic (congestive) and diastolic (congestive) heart failure: Secondary | ICD-10-CM | POA: Diagnosis not present

## 2015-09-28 DIAGNOSIS — J449 Chronic obstructive pulmonary disease, unspecified: Secondary | ICD-10-CM | POA: Diagnosis not present

## 2015-09-28 DIAGNOSIS — I255 Ischemic cardiomyopathy: Secondary | ICD-10-CM | POA: Diagnosis not present

## 2015-09-28 DIAGNOSIS — I110 Hypertensive heart disease with heart failure: Secondary | ICD-10-CM | POA: Diagnosis not present

## 2015-09-28 DIAGNOSIS — L89151 Pressure ulcer of sacral region, stage 1: Secondary | ICD-10-CM | POA: Diagnosis not present

## 2015-09-28 DIAGNOSIS — R2689 Other abnormalities of gait and mobility: Secondary | ICD-10-CM | POA: Diagnosis not present

## 2015-10-01 DIAGNOSIS — I110 Hypertensive heart disease with heart failure: Secondary | ICD-10-CM | POA: Diagnosis not present

## 2015-10-01 DIAGNOSIS — I255 Ischemic cardiomyopathy: Secondary | ICD-10-CM | POA: Diagnosis not present

## 2015-10-01 DIAGNOSIS — J449 Chronic obstructive pulmonary disease, unspecified: Secondary | ICD-10-CM | POA: Diagnosis not present

## 2015-10-01 DIAGNOSIS — L89151 Pressure ulcer of sacral region, stage 1: Secondary | ICD-10-CM | POA: Diagnosis not present

## 2015-10-01 DIAGNOSIS — R2689 Other abnormalities of gait and mobility: Secondary | ICD-10-CM | POA: Diagnosis not present

## 2015-10-01 DIAGNOSIS — I5042 Chronic combined systolic (congestive) and diastolic (congestive) heart failure: Secondary | ICD-10-CM | POA: Diagnosis not present

## 2015-10-02 DIAGNOSIS — I5042 Chronic combined systolic (congestive) and diastolic (congestive) heart failure: Secondary | ICD-10-CM | POA: Diagnosis not present

## 2015-10-02 DIAGNOSIS — R2689 Other abnormalities of gait and mobility: Secondary | ICD-10-CM | POA: Diagnosis not present

## 2015-10-02 DIAGNOSIS — I255 Ischemic cardiomyopathy: Secondary | ICD-10-CM | POA: Diagnosis not present

## 2015-10-02 DIAGNOSIS — J449 Chronic obstructive pulmonary disease, unspecified: Secondary | ICD-10-CM | POA: Diagnosis not present

## 2015-10-02 DIAGNOSIS — L89151 Pressure ulcer of sacral region, stage 1: Secondary | ICD-10-CM | POA: Diagnosis not present

## 2015-10-02 DIAGNOSIS — I110 Hypertensive heart disease with heart failure: Secondary | ICD-10-CM | POA: Diagnosis not present

## 2015-10-03 DIAGNOSIS — I5042 Chronic combined systolic (congestive) and diastolic (congestive) heart failure: Secondary | ICD-10-CM | POA: Diagnosis not present

## 2015-10-03 DIAGNOSIS — I110 Hypertensive heart disease with heart failure: Secondary | ICD-10-CM | POA: Diagnosis not present

## 2015-10-03 DIAGNOSIS — I255 Ischemic cardiomyopathy: Secondary | ICD-10-CM | POA: Diagnosis not present

## 2015-10-03 DIAGNOSIS — R2689 Other abnormalities of gait and mobility: Secondary | ICD-10-CM | POA: Diagnosis not present

## 2015-10-03 DIAGNOSIS — L89151 Pressure ulcer of sacral region, stage 1: Secondary | ICD-10-CM | POA: Diagnosis not present

## 2015-10-03 DIAGNOSIS — J449 Chronic obstructive pulmonary disease, unspecified: Secondary | ICD-10-CM | POA: Diagnosis not present

## 2015-10-11 ENCOUNTER — Ambulatory Visit (INDEPENDENT_AMBULATORY_CARE_PROVIDER_SITE_OTHER): Payer: Medicare Other

## 2015-10-11 DIAGNOSIS — I5022 Chronic systolic (congestive) heart failure: Secondary | ICD-10-CM

## 2015-10-11 DIAGNOSIS — Z95810 Presence of automatic (implantable) cardiac defibrillator: Secondary | ICD-10-CM

## 2015-10-11 NOTE — Progress Notes (Signed)
EPIC Encounter for ICM Monitoring  Patient Name: Richard Moody is a 82 y.o. male Date: 10/11/2015 Primary Care Physican: Alesia Richards, MD Primary Cardiologist: Caryl Comes Electrophysiologist: Caryl Comes Dry Weight: 129 lb         Heart Failure questions reviewed, pt asymptomatic.  Wife called back and stated patient's feet are puffy but patient denied any puffiness.   Thoracic impedance abnormal suggesting fluid accumulation 09/20/2015 to 10/11/2015.  Recommendations:  Patient requested I speak with his wife regarding the use of Lasix prn.  Provided ICM number and wife will call back.    Received call back from wife.  Asked if patient can take Furosemide prn and she was not clear if he is supposed to take prn.  I stated I would check with [name], NP in the office and call back.  Patient gave permission to speak with daughter [name] as wife will be out of town Friday.    ICM trend: 10/11/2015     Follow-up plan: ICM clinic phone appointment on 12/04/2015.  Office appointment with [name], NP 11/01/2015.  Copy of ICM check sent to device physician.   Rosalene Billings, RN 10/11/2015 1:09 PM

## 2015-10-12 NOTE — Progress Notes (Signed)
Call back to patient and daughter [name].  Explained I reviewed transmission and meds with [name], NP in the office today.  She recommended patient take Furosemide 40 mg 1 tablet x 2 days only and return to prn.  He and daughter verbalized understanding.  He has appointment with [name] in the office on 11/01/2015.   Advised to call back with any symptoms, concerns or questions.

## 2015-10-15 ENCOUNTER — Encounter: Payer: Self-pay | Admitting: Internal Medicine

## 2015-10-15 ENCOUNTER — Ambulatory Visit (INDEPENDENT_AMBULATORY_CARE_PROVIDER_SITE_OTHER): Payer: Medicare Other | Admitting: Internal Medicine

## 2015-10-15 VITALS — BP 126/68 | HR 72 | Temp 97.5°F | Resp 16 | Ht 70.25 in | Wt 131.0 lb

## 2015-10-15 DIAGNOSIS — E559 Vitamin D deficiency, unspecified: Secondary | ICD-10-CM

## 2015-10-15 DIAGNOSIS — N320 Bladder-neck obstruction: Secondary | ICD-10-CM | POA: Diagnosis not present

## 2015-10-15 DIAGNOSIS — Z136 Encounter for screening for cardiovascular disorders: Secondary | ICD-10-CM

## 2015-10-15 DIAGNOSIS — I5042 Chronic combined systolic (congestive) and diastolic (congestive) heart failure: Secondary | ICD-10-CM

## 2015-10-15 DIAGNOSIS — Z1211 Encounter for screening for malignant neoplasm of colon: Secondary | ICD-10-CM | POA: Diagnosis not present

## 2015-10-15 DIAGNOSIS — I5022 Chronic systolic (congestive) heart failure: Secondary | ICD-10-CM

## 2015-10-15 DIAGNOSIS — Z23 Encounter for immunization: Secondary | ICD-10-CM | POA: Diagnosis not present

## 2015-10-15 DIAGNOSIS — M159 Polyosteoarthritis, unspecified: Secondary | ICD-10-CM

## 2015-10-15 DIAGNOSIS — E782 Mixed hyperlipidemia: Secondary | ICD-10-CM | POA: Diagnosis not present

## 2015-10-15 DIAGNOSIS — J449 Chronic obstructive pulmonary disease, unspecified: Secondary | ICD-10-CM | POA: Diagnosis not present

## 2015-10-15 DIAGNOSIS — Z125 Encounter for screening for malignant neoplasm of prostate: Secondary | ICD-10-CM | POA: Diagnosis not present

## 2015-10-15 DIAGNOSIS — Z79899 Other long term (current) drug therapy: Secondary | ICD-10-CM | POA: Diagnosis not present

## 2015-10-15 DIAGNOSIS — E875 Hyperkalemia: Secondary | ICD-10-CM

## 2015-10-15 DIAGNOSIS — M150 Primary generalized (osteo)arthritis: Secondary | ICD-10-CM

## 2015-10-15 DIAGNOSIS — R7303 Prediabetes: Secondary | ICD-10-CM

## 2015-10-15 DIAGNOSIS — I255 Ischemic cardiomyopathy: Secondary | ICD-10-CM | POA: Diagnosis not present

## 2015-10-15 DIAGNOSIS — I10 Essential (primary) hypertension: Secondary | ICD-10-CM

## 2015-10-15 DIAGNOSIS — K219 Gastro-esophageal reflux disease without esophagitis: Secondary | ICD-10-CM | POA: Diagnosis not present

## 2015-10-15 DIAGNOSIS — Z95810 Presence of automatic (implantable) cardiac defibrillator: Secondary | ICD-10-CM | POA: Diagnosis not present

## 2015-10-15 LAB — CBC WITH DIFFERENTIAL/PLATELET
BASOS ABS: 57 {cells}/uL (ref 0–200)
BASOS PCT: 1 %
EOS ABS: 114 {cells}/uL (ref 15–500)
Eosinophils Relative: 2 %
HEMATOCRIT: 34.9 % — AB (ref 38.5–50.0)
Hemoglobin: 11.5 g/dL — ABNORMAL LOW (ref 13.2–17.1)
LYMPHS ABS: 1368 {cells}/uL (ref 850–3900)
MCH: 30.6 pg (ref 27.0–33.0)
MCHC: 33 g/dL (ref 32.0–36.0)
MCV: 92.8 fL (ref 80.0–100.0)
MPV: 10.2 fL (ref 7.5–12.5)
Monocytes Absolute: 627 cells/uL (ref 200–950)
Monocytes Relative: 11 %
NEUTROS PCT: 62 %
Neutro Abs: 3534 cells/uL (ref 1500–7800)
Platelets: 250 10*3/uL (ref 140–400)
RBC: 3.76 MIL/uL — ABNORMAL LOW (ref 4.20–5.80)
RDW: 15 % (ref 11.0–15.0)
WBC: 5.7 10*3/uL (ref 3.8–10.8)

## 2015-10-15 MED ORDER — DICLOFENAC SODIUM 1 % TD GEL: TRANSDERMAL | 3 refills | Status: DC

## 2015-10-15 NOTE — Progress Notes (Signed)
[place] [facility]    Unk Pinto, M.D.    [name]. Silverio Lay, P.A.-C      Starlyn Skeans, P.A.-C   [facility]                [address] [place] [id number] Telephone [phone] Telefax [phone]  Comprehensive Evaluation & Examination     This very nice 82y.o. MWM presents for a  comprehensive evaluation and management of multiple medical co-morbidities.  Patient has been followed for HTN, ASCAD, Prediabetes, Hyperlipidemia and Vitamin D Deficiency. Patient also has moderately severe COPD.     Patient has hx/o ASCAD undergoing CABG in 1992 and later HTN was dx'd about  2003. Patient's BP has been controlled at home. Today's BP: 126/68. In 2010 , patient had an AICD w/ pacer & defibrillator implanted by Dr Caryl Comes. Patient has hx/o Hyporeneimic hypoaldosteronism with  hyperkalemia and more recently his treatments with florinef were d/c'd for development of chronic Heart Failure. Patient denies any cardiac symptoms as chest pain, palpitations, shortness of breath, dizziness or ankle swelling.     Patient's hyperlipidemia is controlled with diet and medications. Patient denies myalgias or other medication SE's. Last lipids were at goal.  Lab Results  Component Value Date   CHOL 175 07/18/2015   HDL 70 07/18/2015   LDLCALC 89 07/18/2015   TRIG 81 07/18/2015   CHOLHDL 2.5 07/18/2015      Patient has prediabetes with A1c 5.8% in 01/2010 and patient denies reactive hypoglycemic symptoms, visual blurring, diabetic polys or paresthesias. Last A1c was  Lab Results  Component Value Date   HGBA1C 5.7 (H) 07/18/2015       Finally, patient has history of Vitamin D Deficiency of  "22" in 2008 and last vitamin D was at goal: Lab Results  Component Value Date   VD25OH 71 04/09/2015   Current Outpatient Prescriptions on File Prior to Visit  Medication Sig  . acetaminophen (TYLENOL) 500 MG tablet Take  500 mg by mouth as directed.  Marland Kitchen amiodarone (PACERONE) 200 MG tablet Take 1 tablet (200 mg total) by mouth daily.  Marland Kitchen aspirin 81 MG tablet Take 81 mg by mouth 3 (three) times a week.    . Cholecalciferol (VITAMIN D3) 5000 UNITS CAPS Take 2 capsules by mouth daily.    Marland Kitchen darifenacin (ENABLEX) 7.5 MG 24 hr tablet Take 7.5 mg by mouth daily. Reported on 08/31/2015  . esomeprazole (NEXIUM) 40 MG capsule Take 1 capsule by mouth daily.  . fish oil-omega-3 fatty acids 1000 MG capsule Take 2 g by mouth daily.    . furosemide (LASIX) 40 MG tablet Take 1 tablet (40 mg total) by mouth as needed.  Marland Kitchen glucosamine-chondroitin 500-400 MG tablet Take 1 tablet by mouth 2 (two) times daily.    . IRON PO Take 45 mg by mouth daily.   . Magnesium 250 MG TABS Take 1 tablet by mouth 2 (two) times daily.   . metoprolol succinate (TOPROL-XL) 25 MG 24 hr tablet Take 25 tablets by mouth at bedtime. Pt takes 12.5 mg daily at bedtime  . Multiple Minerals-Vitamins (CITRACAL PLUS PO) Take 1 capsule by mouth daily.   . polyethylene glycol (MIRALAX / GLYCOLAX) packet Take 17 g by mouth as needed for moderate constipation.   . traMADol (ULTRAM) 50 MG tablet Take 1 tablet (50 mg total) by mouth every 6 (  six) hours as needed.  . vitamin C (ASCORBIC ACID) 500 MG tablet Take 1,000 mg by mouth daily.     No current facility-administered medications on file prior to visit.    Allergies  Allergen Reactions  . Lipitor [Atorvastatin]     Elevated cpk   . Mobic [Meloxicam] Other (See Comments)    edema  . Nsaids Other (See Comments)    dyspepsia   Past Medical History:  Diagnosis Date  . Ganglion cyst of wrist    LEFT WRIST  . GERD (gastroesophageal reflux disease)   . Hx of CABG 1992  . Hyperlipidemia   . ICD (implantable cardiac defibrillator) in place August 2010  . Ischemic cardiomyopathy    EF 32%  . MI, old    INFERIOR LATERAL  . Normal nuclear stress test June 2010   No ischemia. EF 32%  . OA (osteoarthritis) of  knee   . Pre-diabetes   . S/P cardiac cath 2006   Occluded SVG to OM. Managed medically  . Vitamin D deficiency    Health Maintenance  Topic Date Due  . TETANUS/TDAP  03/03/2013  . PNA vac Low Risk Adult (2 of 2 - PCV13) 06/02/2014  . INFLUENZA VACCINE  10/02/2015  . ZOSTAVAX  01/02/2016 (Originally 11/04/1989)   Immunization History  Administered Date(s) Administered  . Influenza, High Dose Seasonal PF 12/07/2013, 01/04/2015  . Influenza-Unspecified 01/01/2013  . Pneumococcal Polysaccharide-23 07/02/2001, 06/01/2013  . Tdap 03/04/2003   Past Surgical History:  Procedure Laterality Date  . CARDIAC CATHETERIZATION  10/14/2004   THERE IS POSTERIOR LATERAL HYPOKINESIA. EF 30-35%  . CARDIAC CATHETERIZATION  2006   OCCLUDED SVG TO OM  . CARDIAC DEFIBRILLATOR PLACEMENT  10/2008  . CORONARY ARTERY BYPASS GRAFT  1992  . INGUINAL HERNIA REPAIR    . KNEE SURGERY     RIGHT KNEE  . NUCLEAR STRESS TEST  2010   EF 32%. No ischemia   Family History  Problem Relation Age of Onset  . Cancer Mother     ABDOMINAL CANCER  . Hypertension Father   . Stroke Father   . Breast cancer Sister    Social History   Social History  . Marital status: Married    Spouse name: N/A  . Number of children: N/A  . Years of education: N/A   Occupational History  . retired Mgmt [employer]    Social History Main Topics  . Smoking status: Former Smoker    Quit date: 03/03/1982  . Smokeless tobacco: Never Used  . Alcohol use No  . Drug use: No  . Sexual activity: Not on file    ROS Constitutional: Denies fever, chills, weight loss/gain, headaches, insomnia,  night sweats or change in appetite. Does c/o fatigue. Eyes: Denies redness, blurred vision, diplopia, discharge, itchy or watery eyes.  ENT: Denies discharge, congestion, post nasal drip, epistaxis, sore throat, earache, hearing loss, dental pain, Tinnitus, Vertigo, Sinus pain or snoring.  Cardio: Denies chest pain, palpitations, irregular  heartbeat, syncope, dyspnea, diaphoresis, orthopnea, PND, claudication or edema Respiratory: denies cough, dyspnea, DOE, pleurisy, hoarseness, laryngitis or wheezing.  Gastrointestinal: Denies dysphagia, heartburn, reflux, water brash, pain, cramps, nausea, vomiting, bloating, diarrhea, constipation, hematemesis, melena, hematochezia, jaundice or hemorrhoids Genitourinary: Denies dysuria, frequency, nocturia, discharge, hematuria or flank pain. C/o Mild hesitancy & urgency.  Musculoskeletal: Denies  myalgia,Jt. Swelling,limp or strain/sprain. C/o pain & stiffness of the Left shoulder & knee. Denies Falls. Skin: Denies puritis, rash, hives, warts, acne, eczema or change in  skin lesion Neuro: No weakness, tremor, incoordination, spasms, paresthesia or pain Psychiatric: Denies confusion, memory loss or sensory loss. Denies Depression. Endocrine: Denies change in weight, skin, hair change, nocturia, and paresthesia, diabetic polys, visual blurring or hyper / hypo glycemic episodes.  Heme/Lymph: No excessive bleeding, bruising or enlarged lymph nodes.  Physical Exam  BP 126/68   Pulse 72   Temp 97.5 F (36.4 C)   Resp 16   Ht 5' 10.25" (1.784 m)   Wt 131 lb (59.4 kg)   BMI 18.66 kg/m   General Appearance: Chronically ill appearing malnourished appearing elderly white male  in no apparent distress. Eyes: PERRLA, EOMs, conjunctiva no swelling or erythema, normal fundi and vessels. Sinuses: No frontal/maxillary tenderness ENT/Mouth: EACs patent / TMs  nl. Nares clear without erythema, swelling, mucoid exudates. Oral hygiene is good. No erythema, swelling, or exudate. Tongue normal, non-obstructing. Tonsils not swollen or erythematous. Hearing normal.  Neck: Supple, thyroid normal. No bruits, nodes or JVD. Respiratory: Respiratory effort normal.  BS decreased without rales, rhonci, wheezing or stridor. Cardio: Heart sounds are normal with regular rate and rhythm and no murmurs, rubs or gallops.  Peripheral pulses are normal and equal bilaterally without edema. No aortic or femoral bruits. Chest: Barrel configuration with diaphragms low with mild kyphosis. Palp pacer disc Left infraclavicular area.  Abdomen: Soft, with Nl bowel sounds. Nontender, no guarding, rebound, hernias, masses, or organomegaly.  Lymphatics: Non tender without lymphadenopathy.  Genitourinary: No hernias.Testes nl. DRE - deferred for age. Musculoskeletal: Generalized decrease in muscle , power , tone & bulk consistent with age & deconditioning. Crepitus of Lt shoulder wit decreased ROM from p[ain and crepitus of the Left knee.  Gait broad-based and stabilized with a walker.  Skin: Warm and dry without rashes, lesions, cyanosis, clubbing or  ecchymosis.  Neuro: Cranial nerves intact, reflexes equal bilaterally. Normal muscle tone, no cerebellar symptoms. Sensation intact.  Pysch: Alert and oriented X 3 with normal affect, insight and judgment appropriate.   Assessment and Plan  1. Essential hypertension  - Microalbumin / creatinine urine ratio - EKG 12-Lead - [place], RETROPERITNL ABD,  LTD - TSH  2. Hyperlipidemia  - EKG 12-Lead - [place], RETROPERITNL ABD,  LTD - Lipid panel - TSH  3. Prediabetes  - EKG 12-Lead - [place], RETROPERITNL ABD,  LTD - Hemoglobin A1c - Insulin, random  4. Vitamin D deficiency  - VITAMIN D 25 Hydroxy   5. Hyperkalemia (Hyporeninemic HypoAldosteronism)   6. Gastroesophageal reflux disease   7. ASCAD s/p CABG (1992)  - EKG 12-Lead - [place], RETROPERITNL ABD,  LTD  8. Automatic implantable cardioverter-defibrillator in situ   9. Chronic combined systolic and diastolic congestive heart failure ([place])   10. Obstructive chronic bronchitis without exacerbation ([place])   11. Screening for ischemic heart disease  - EKG 12-Lead  12. Screening for AAA (aortic abdominal aneurysm)  - [place], RETROPERITNL ABD,  LTD  13. Colon cancer screening  - POC Hemoccult Bld/Stl   14.  Prostate cancer screening  - PSA  15. Bladder neck obstruction  - PSA  16. Medication management  - Urinalysis, Routine w reflex microscopic - CBC with Differential/Platelet - BASIC METABOLIC PANEL WITH GFR - Hepatic function panel - Magnesium  17. Need for prophylactic vaccination with tetanus-diphtheria (TD)  - Td : Tetanus/diphtheria       Continue prudent diet as discussed, weight control, BP monitoring, regular exercise, and medications as discussed.  Discussed med effects and SE's. Routine screening labs and tests  as requested with regular follow-up as recommended. Over 40 minutes of exam, counseling, chart review and high complex critical decision making was performed

## 2015-10-15 NOTE — Patient Instructions (Signed)

## 2015-10-16 ENCOUNTER — Other Ambulatory Visit: Payer: Self-pay | Admitting: *Deleted

## 2015-10-16 DIAGNOSIS — M159 Polyosteoarthritis, unspecified: Secondary | ICD-10-CM

## 2015-10-16 DIAGNOSIS — M150 Primary generalized (osteo)arthritis: Principal | ICD-10-CM

## 2015-10-16 LAB — MICROALBUMIN / CREATININE URINE RATIO
Creatinine, Urine: 123 mg/dL (ref 20–370)
Microalb Creat Ratio: 7 ug/mg{creat}
Microalb, Ur: 0.8 mg/dL

## 2015-10-16 LAB — LIPID PANEL
CHOLESTEROL: 158 mg/dL (ref 125–200)
HDL: 78 mg/dL (ref >=40)
LDL Cholesterol: 66 mg/dL (ref <130)
TRIGLYCERIDES: 68 mg/dL (ref <150)
Total CHOL/HDL Ratio: 2 Ratio (ref <=5.0)
VLDL: 14 mg/dL (ref <30)

## 2015-10-16 LAB — HEPATIC FUNCTION PANEL
ALT: 9 U/L (ref 9–46)
AST: 13 U/L (ref 10–35)
Albumin: 3.8 g/dL (ref 3.6–5.1)
Alkaline Phosphatase: 88 U/L (ref 40–115)
Bilirubin, Direct: 0.1 mg/dL
Indirect Bilirubin: 0.3 mg/dL (ref 0.2–1.2)
Total Bilirubin: 0.4 mg/dL (ref 0.2–1.2)
Total Protein: 6.4 g/dL (ref 6.1–8.1)

## 2015-10-16 LAB — VITAMIN D 25 HYDROXY (VIT D DEFICIENCY, FRACTURES): Vit D, 25-Hydroxy: 103 ng/mL — ABNORMAL HIGH (ref 30–100)

## 2015-10-16 LAB — URINALYSIS, ROUTINE W REFLEX MICROSCOPIC
Bilirubin Urine: NEGATIVE
Glucose, UA: NEGATIVE
Hgb urine dipstick: NEGATIVE
Ketones, ur: NEGATIVE
Leukocytes, UA: NEGATIVE
Nitrite: NEGATIVE
Protein, ur: NEGATIVE
Specific Gravity, Urine: 1.021 (ref 1.001–1.035)
pH: 6 (ref 5.0–8.0)

## 2015-10-16 LAB — BASIC METABOLIC PANEL WITH GFR
BUN: 29 mg/dL — AB (ref 7–25)
CALCIUM: 8.9 mg/dL (ref 8.6–10.3)
CO2: 24 mmol/L (ref 20–31)
CREATININE: 1.05 mg/dL (ref 0.70–1.11)
Chloride: 101 mmol/L (ref 98–110)
GFR, EST AFRICAN AMERICAN: 74 mL/min (ref >=60)
GFR, Est Non African American: 64 mL/min (ref >=60)
GLUCOSE: 98 mg/dL (ref 65–99)
Potassium: 4.7 mmol/L (ref 3.5–5.3)
Sodium: 136 mmol/L (ref 135–146)

## 2015-10-16 LAB — TSH: TSH: 3.88 mIU/L (ref 0.40–4.50)

## 2015-10-16 LAB — MAGNESIUM: Magnesium: 2.1 mg/dL (ref 1.5–2.5)

## 2015-10-16 LAB — PSA

## 2015-10-16 LAB — HEMOGLOBIN A1C
HEMOGLOBIN A1C: 5.6 % (ref <5.7)
MEAN PLASMA GLUCOSE: 114 mg/dL

## 2015-10-16 LAB — INSULIN, RANDOM: INSULIN: 4.8 u[IU]/mL (ref 2.0–19.6)

## 2015-10-31 NOTE — Progress Notes (Signed)
Electrophysiology Office Note Date: 11/01/2015  ID:  Richard Moody, DOB September 30, 1927, MRN [id number]  PCP: Alesia Richards, MD Electrophysiologist: Caryl Comes  CC: PVC follow up  Richard Moody is a 82 y.o. male seen today for Dr Caryl Comes.  He presents today for routine electrophysiology followup.  Since last being seen in clinic by me, he reports significant improvement in shortness of breath and LE edema. He denies chest pain, palpitations, dyspnea, PND, orthopnea, nausea, vomiting, dizziness, syncope, weight gain, or early satiety.  He has not had ICD shocks.   Echo 09/2014 demonstrated EF 123456, grade 1 diastolic dysfunction, mild MR, PA pressure 35  Device History: MDT dual chamber ICD implanted 2010 for ICM History of appropriate therapy: No History of AAD therapy: Yes - amiodarone for PVC's    Past Medical History:  Diagnosis Date  . Ganglion cyst of wrist    LEFT WRIST  . GERD (gastroesophageal reflux disease)   . Hx of CABG 1992  . Hyperlipidemia   . ICD (implantable cardiac defibrillator) in place August 2010  . Ischemic cardiomyopathy    EF 32%  . MI, old    INFERIOR LATERAL  . Normal nuclear stress test June 2010   No ischemia. EF 32%  . OA (osteoarthritis) of knee   . Pre-diabetes   . S/P cardiac cath 2006   Occluded SVG to OM. Managed medically  . Vitamin D deficiency    Past Surgical History:  Procedure Laterality Date  . CARDIAC CATHETERIZATION  10/14/2004   THERE IS POSTERIOR LATERAL HYPOKINESIA. EF 30-35%  . CARDIAC CATHETERIZATION  2006   OCCLUDED SVG TO OM  . CARDIAC DEFIBRILLATOR PLACEMENT  10/2008  . CORONARY ARTERY BYPASS GRAFT  1992  . INGUINAL HERNIA REPAIR    . KNEE SURGERY     RIGHT KNEE  . NUCLEAR STRESS TEST  2010   EF 32%. No ischemia    Current Outpatient Prescriptions  Medication Sig Dispense Refill  . acetaminophen (TYLENOL) 500 MG tablet Take 500 mg by mouth as directed.    Marland Kitchen amiodarone (PACERONE) 200 MG tablet Take 1 tablet  (200 mg total) by mouth daily. 10 tablet 0  . aspirin 81 MG tablet Take 81 mg by mouth 3 (three) times a week.      . Cholecalciferol (VITAMIN D3) 5000 UNITS CAPS Take 2 capsules by mouth daily.      . diclofenac sodium (VOLTAREN) 1 % GEL Apply to painful Left shoulder & Knee 2-4 x/daily as needed 400 g 3  . diphenhydrAMINE (BENADRYL) 25 MG tablet Take 25 mg by mouth at bedtime as needed.    Marland Kitchen esomeprazole (NEXIUM) 40 MG capsule Take 1 capsule by mouth daily.    . fish oil-omega-3 fatty acids 1000 MG capsule Take 2 g by mouth daily.      . furosemide (LASIX) 40 MG tablet Take 1 tablet (40 mg total) by mouth as needed. 90 tablet 1  . glucosamine-chondroitin 500-400 MG tablet Take 1 tablet by mouth 2 (two) times daily.      . IRON PO Take 45 mg by mouth daily.     . Magnesium 250 MG TABS Take 1 tablet by mouth 2 (two) times daily.     . metoprolol succinate (TOPROL-XL) 25 MG 24 hr tablet Take 25 tablets by mouth at bedtime. Pt takes 12.5 mg daily at bedtime    . Multiple Minerals-Vitamins (CITRACAL PLUS PO) Take 1 capsule by mouth daily.     Marland Kitchen  polyethylene glycol (MIRALAX / GLYCOLAX) packet Take 17 g by mouth as needed for moderate constipation.     . traMADol (ULTRAM) 50 MG tablet Take 1 tablet (50 mg total) by mouth every 6 (six) hours as needed. 90 tablet 5  . vitamin C (ASCORBIC ACID) 500 MG tablet Take 1,000 mg by mouth daily.       No current facility-administered medications for this visit.     Allergies:   Lipitor [atorvastatin]; Mobic [meloxicam]; and Nsaids   Social History: Social History   Social History  . Marital status: Married    Spouse name: N/A  . Number of children: N/A  . Years of education: N/A   Occupational History  . Not on file.   Social History Main Topics  . Smoking status: Former Smoker    Quit date: 03/03/1982  . Smokeless tobacco: Never Used  . Alcohol use No  . Drug use: No  . Sexual activity: Not on file   Other Topics Concern  . Not on file    Social History Narrative  . No narrative on file    Family History: Family History  Problem Relation Age of Onset  . Cancer Mother     ABDOMINAL CANCER  . Hypertension Father   . Stroke Father   . Breast cancer Sister     Review of Systems: All other systems reviewed and are otherwise negative except as noted above.   Physical Exam: VS:  BP 110/62   Pulse 67   Ht 5\' 11"  (1.803 m)   Wt 130 lb 1.9 oz (59 kg)   SpO2 96%   BMI 18.15 kg/m  , BMI Body mass index is 18.15 kg/m.  GEN- The patient is elderly, frail, cachectic appearing, alert and oriented x 3 today.   HEENT: normocephalic, atraumatic; sclera clear, conjunctiva pink; hearing intact; oropharynx clear; neck supple Lungs- Clear to ausculation bilaterally, normal work of breathing.  No wheezes, rales, rhonchi Heart- Regular rate and rhythm  GI- soft, non-tender, non-distended, bowel sounds present  Extremities- no clubbing, cyanosis, no edema, wearing compression hose MS- no significant deformity or atrophy Skin- warm and dry, no rash or lesion; ICD pocket well healed Psych- euthymic mood, full affect Neuro- strength and sensation are intact  ICD interrogation- reviewed in detail today,  See PACEART report  EKG:  EKG is not ordered today.  Recent Labs: 10/15/2015: ALT 9; BUN 29; Creat 1.05; Hemoglobin 11.5; Magnesium 2.1; Platelets 250; Potassium 4.7; Sodium 136; TSH 3.88   Wt Readings from Last 3 Encounters:  11/01/15 130 lb 1.9 oz (59 kg)  10/15/15 131 lb (59.4 kg)  08/31/15 133 lb (60.3 kg)     Other studies Reviewed: Additional studies/ records that were reviewed today include: Dr Olin Pia notes  Assessment and Plan:  1.  Chronic systolic dysfunction Euvolemic on exam Normal ICD function See Pace Art report No changes today Continue follow up in ICM clinic   2.  CAD/ICM No recent ischemic symptoms Continue medical therapy  3.  PVC's Burden by device interrogation today significantly  improved (was 619/hour, now 32/hour) Continue amiodarone 200mg  daily - recent LFT's and TSH normal  4.  Code status His ICD is approaching ERI.  With sinus node dysfunction, he will likely need brady support. I have encouraged he and his wife to discuss code status as well as considering replacing device with pacemaker when device reaches ERI.    Current medicines are reviewed at length with the patient today.  The patient does not have concerns regarding his medicines.  The following changes were made today:  none  Labs/ tests ordered today include: none   Disposition:   Follow up with Dr Caryl Comes in 3 months, continue follow up in [facility] clinic    Signed, [name], NP 11/01/2015 12:10 PM  [facility] [address] [place] [address] [phone] (office) [phone] (fax)

## 2015-11-01 ENCOUNTER — Ambulatory Visit (INDEPENDENT_AMBULATORY_CARE_PROVIDER_SITE_OTHER): Payer: Medicare Other | Admitting: Nurse Practitioner

## 2015-11-01 ENCOUNTER — Encounter: Payer: Self-pay | Admitting: Nurse Practitioner

## 2015-11-01 VITALS — BP 110/62 | HR 67 | Ht 71.0 in | Wt 130.1 lb

## 2015-11-01 DIAGNOSIS — I5022 Chronic systolic (congestive) heart failure: Secondary | ICD-10-CM

## 2015-11-01 DIAGNOSIS — I255 Ischemic cardiomyopathy: Secondary | ICD-10-CM

## 2015-11-01 DIAGNOSIS — I493 Ventricular premature depolarization: Secondary | ICD-10-CM

## 2015-11-01 NOTE — Patient Instructions (Addendum)
Medication Instructions:  Your physician recommends that you continue on your current medications as directed. Please refer to the Current Medication list given to you today.   Labwork: None ordered  Testing/Procedures: None ordered  Follow-Up: Your physician recommends that you schedule a follow-up appointment in: 3 MONTHS WITH DR. KLEIN   Any Other Special Instructions Will Be Listed Below (If Applicable).     If you need a refill on your cardiac medications before your next appointment, please call your pharmacy.   

## 2015-11-02 LAB — CUP PACEART INCLINIC DEVICE CHECK
Implantable Lead Implant Date: 20100805
Implantable Lead Location: 753859
Implantable Lead Model: 6947
MDC IDC LEAD IMPLANT DT: 20100805
MDC IDC LEAD LOCATION: 753860
MDC IDC SESS DTM: 20170901080843

## 2015-11-06 ENCOUNTER — Ambulatory Visit (INDEPENDENT_AMBULATORY_CARE_PROVIDER_SITE_OTHER): Payer: Medicare Other | Admitting: *Deleted

## 2015-11-06 DIAGNOSIS — Z95810 Presence of automatic (implantable) cardiac defibrillator: Secondary | ICD-10-CM

## 2015-11-06 DIAGNOSIS — I255 Ischemic cardiomyopathy: Secondary | ICD-10-CM

## 2015-11-07 NOTE — Progress Notes (Signed)
Remote ICD transmission.   

## 2015-11-27 DIAGNOSIS — Z23 Encounter for immunization: Secondary | ICD-10-CM | POA: Diagnosis not present

## 2015-12-04 ENCOUNTER — Ambulatory Visit (INDEPENDENT_AMBULATORY_CARE_PROVIDER_SITE_OTHER): Payer: Medicare Other

## 2015-12-04 DIAGNOSIS — Z95810 Presence of automatic (implantable) cardiac defibrillator: Secondary | ICD-10-CM | POA: Diagnosis not present

## 2015-12-04 DIAGNOSIS — I5022 Chronic systolic (congestive) heart failure: Secondary | ICD-10-CM

## 2015-12-04 DIAGNOSIS — I255 Ischemic cardiomyopathy: Secondary | ICD-10-CM | POA: Diagnosis not present

## 2015-12-05 ENCOUNTER — Telehealth: Payer: Self-pay

## 2015-12-05 NOTE — Progress Notes (Signed)
EPIC Encounter for ICM Monitoring  Patient Name: SUHAN DUMAS is a 82 y.o. male Date: 12/05/2015 Primary Care Physican: Alesia Richards, MD Primary Cardiologist: Caryl Comes Electrophysiologist: Caryl Comes Dry Weight:  unknown       Attempted ICM call and unable to reach.  Transmission reviewed.   Thoracic impedance normal.  Follow-up plan: ICM clinic phone appointment on 01/07/2016.  Copy of ICM check sent to device physician.   ICM trend: 12/04/2015       Rosalene Billings, RN 12/05/2015 10:21 AM

## 2015-12-05 NOTE — Telephone Encounter (Signed)
Remote ICM transmission received.  Attempted patient call and left detailed message regarding transmission and next ICM scheduled for 01/07/2016.  Advised to return call for any fluid symptoms or questions.

## 2016-01-07 ENCOUNTER — Ambulatory Visit (INDEPENDENT_AMBULATORY_CARE_PROVIDER_SITE_OTHER): Payer: Medicare Other

## 2016-01-07 DIAGNOSIS — I5022 Chronic systolic (congestive) heart failure: Secondary | ICD-10-CM

## 2016-01-07 DIAGNOSIS — Z95810 Presence of automatic (implantable) cardiac defibrillator: Secondary | ICD-10-CM

## 2016-01-07 NOTE — Progress Notes (Signed)
EPIC Encounter for ICM Monitoring  Patient Name: Richard Moody is a 82 y.o. male Date: 01/07/2016 Primary Care Physican: Alesia Richards, MD Primary [place] Electrophysiologist: Caryl Comes Dry Weight:     unknown      Spoke with wife (DPR). Heart Failure questions reviewed, pt asymptomatic   Thoracic impedance normal   Recommendations: No changes.  Advised to limit salt intake to 2000 mg daily.  Encouraged to call for fluid symptoms.    Follow-up plan: ICM clinic phone appointment on 03/04/2016.  Office appointment with Dr Caryl Comes on 01/31/2016.  Copy of ICM check sent to device physician.   ICM trend: 01/07/2016       Richard Billings, RN 01/07/2016 10:45 AM

## 2016-01-17 ENCOUNTER — Ambulatory Visit (INDEPENDENT_AMBULATORY_CARE_PROVIDER_SITE_OTHER): Payer: Medicare Other | Admitting: Physician Assistant

## 2016-01-17 ENCOUNTER — Encounter: Payer: Self-pay | Admitting: Physician Assistant

## 2016-01-17 VITALS — BP 118/74 | HR 78 | Temp 97.5°F | Resp 14 | Ht 71.0 in | Wt 142.2 lb

## 2016-01-17 DIAGNOSIS — R7303 Prediabetes: Secondary | ICD-10-CM

## 2016-01-17 DIAGNOSIS — E559 Vitamin D deficiency, unspecified: Secondary | ICD-10-CM

## 2016-01-17 DIAGNOSIS — I5042 Chronic combined systolic (congestive) and diastolic (congestive) heart failure: Secondary | ICD-10-CM | POA: Diagnosis not present

## 2016-01-17 DIAGNOSIS — I10 Essential (primary) hypertension: Secondary | ICD-10-CM

## 2016-01-17 DIAGNOSIS — I255 Ischemic cardiomyopathy: Secondary | ICD-10-CM

## 2016-01-17 DIAGNOSIS — E782 Mixed hyperlipidemia: Secondary | ICD-10-CM | POA: Diagnosis not present

## 2016-01-17 LAB — CBC WITH DIFFERENTIAL/PLATELET
BASOS PCT: 0 %
Basophils Absolute: 0 cells/uL (ref 0–200)
EOS ABS: 240 {cells}/uL (ref 15–500)
Eosinophils Relative: 4 %
HCT: 33.3 % — ABNORMAL LOW (ref 38.5–50.0)
Hemoglobin: 11.3 g/dL — ABNORMAL LOW (ref 13.2–17.1)
LYMPHS ABS: 1320 {cells}/uL (ref 850–3900)
Lymphocytes Relative: 22 %
MCH: 32 pg (ref 27.0–33.0)
MCHC: 33.9 g/dL (ref 32.0–36.0)
MCV: 94.3 fL (ref 80.0–100.0)
MONOS PCT: 12 %
MPV: 10.2 fL (ref 7.5–12.5)
Monocytes Absolute: 720 cells/uL (ref 200–950)
NEUTROS ABS: 3720 {cells}/uL (ref 1500–7800)
Neutrophils Relative %: 62 %
PLATELETS: 246 10*3/uL (ref 140–400)
RBC: 3.53 MIL/uL — ABNORMAL LOW (ref 4.20–5.80)
RDW: 14.4 % (ref 11.0–15.0)
WBC: 6 10*3/uL (ref 3.8–10.8)

## 2016-01-17 LAB — TSH: TSH: 4.26 mIU/L (ref 0.40–4.50)

## 2016-01-17 LAB — BASIC METABOLIC PANEL WITH GFR
BUN: 27 mg/dL — ABNORMAL HIGH (ref 7–25)
CHLORIDE: 103 mmol/L (ref 98–110)
CO2: 27 mmol/L (ref 20–31)
Calcium: 8.9 mg/dL (ref 8.6–10.3)
Creat: 1.04 mg/dL (ref 0.70–1.11)
GFR, Est African American: 75 mL/min (ref >=60)
GFR, Est Non African American: 65 mL/min (ref >=60)
Glucose, Bld: 103 mg/dL — ABNORMAL HIGH (ref 65–99)
POTASSIUM: 4.7 mmol/L (ref 3.5–5.3)
SODIUM: 137 mmol/L (ref 135–146)

## 2016-01-17 LAB — LIPID PANEL
CHOL/HDL RATIO: 2.6 ratio (ref <5.0)
Cholesterol: 177 mg/dL (ref <200)
HDL: 69 mg/dL (ref >40)
LDL Cholesterol: 91 mg/dL (ref <100)
Triglycerides: 84 mg/dL (ref <150)
VLDL: 17 mg/dL (ref <30)

## 2016-01-17 LAB — HEPATIC FUNCTION PANEL
ALK PHOS: 99 U/L (ref 40–115)
ALT: 10 U/L (ref 9–46)
AST: 15 U/L (ref 10–35)
Albumin: 3.6 g/dL (ref 3.6–5.1)
BILIRUBIN DIRECT: 0.1 mg/dL (ref <=0.2)
BILIRUBIN TOTAL: 0.3 mg/dL (ref 0.2–1.2)
Indirect Bilirubin: 0.2 mg/dL (ref 0.2–1.2)
Total Protein: 6.3 g/dL (ref 6.1–8.1)

## 2016-01-17 NOTE — Progress Notes (Signed)
Patient ID: Richard Moody, male   DOB: June 11, 1927, 82 y.o.   MRN: [id number]  3 month OV  Assessment:   Essential hypertension - continue medications the same, doing better with hypotension, continue amiodarone - TSH   Hyperlipidemia - Lipid panel  Prediabetes - Hemoglobin A1c - Insulin, random  Vitamin D deficiency - Vit D  25 hydroxy  ASCAD s/p MI, Stents, CABG & AICD/ Defib Control blood pressure, cholesterol, glucose, increase exercise.  Discussed AICD, battery is getting near end of life, discussed with his age may not be advisable at this time, will discuss with cardio Has never had shocks Have been well controlled  Medication management - CBC with Differential/Platelet - BASIC METABOLIC PANEL WITH GFR - Hepatic function panel - Magnesium  Chronic systolic CHF (EF 123456) Has AICD, on amiodarone now, tolerating well  Future Appointments Date Time Provider [place]  01/31/2016 3:15 PM Deboraha Sprang, MD [facility]  03/04/2016 11:00 AM [facility] DEVICE REMOTES [facility]  04/23/2016 2:30 PM Unk Pinto, MD [facility] None  05/01/2016 8:00 AM [facility] DEVICE REMOTES [facility]  11/19/2016 2:00 PM Unk Pinto, MD [facility] None     Subjective:   Richard Moody is a 82 y.o. MWM who presents 3 month follow up with Hypertension, ASCAD Hyperlipidemia, Pre-Diabetes and Vitamin D Deficiency.   Patient is treated for HTN since 2003 & BP has been controlled at home. Today's BP: 118/74. Patient had a MI  And angioplasty in 1989 and then in 1992 underwent CABG. In 2012 he was dx'd with CHF, EF 35-40%, and had a AICD/Defib placed. Patient has had no complaints of any cardiac type chest pain, palpitations, dyspnea/orthopnea/PND, dizziness, claudication, or dependent edema. He has had hypotension recently, He is on amiodarone daily and will occ take 1/2 of the 25mg  metoprolol depending on his BP.   Patient also has  moderate COPD with DOE and denies any significant coughing. He has been wearing compression stockings that is helping decrease swelling.    He is not on cholesterol medication and denies myalgias. His cholesterol is at goal. The cholesterol last visit was:   Lab Results  Component Value Date   CHOL 158 10/15/2015   HDL 78 10/15/2015   LDLCALC 66 10/15/2015   TRIG 68 10/15/2015   CHOLHDL 2.0 10/15/2015    He has been working on diet and exercise for prediabetes, and denies paresthesia of the feet, polydipsia, polyuria and visual disturbances. Last A1C in the office was:  Lab Results  Component Value Date   HGBA1C 5.6 10/15/2015   Patient is on Vitamin D supplement.   Lab Results  Component Value Date   VD25OH 103 (H) 10/15/2015     Medication Review: Current Outpatient Prescriptions on File Prior to Visit  Medication Sig  . acetaminophen (TYLENOL) 500 MG tablet Take 500 mg by mouth as directed.  Marland Kitchen amiodarone (PACERONE) 200 MG tablet Take 1 tablet (200 mg total) by mouth daily.  Marland Kitchen aspirin 81 MG tablet Take 81 mg by mouth 3 (three) times a week.    . Cholecalciferol (VITAMIN D3) 5000 UNITS CAPS Take 2 capsules by mouth daily.    . diclofenac sodium (VOLTAREN) 1 % GEL Apply to painful Left shoulder & Knee 2-4 x/daily as needed  . diphenhydrAMINE (BENADRYL) 25 MG tablet Take 25 mg by mouth at bedtime as needed.  Marland Kitchen esomeprazole (NEXIUM) 40 MG capsule Take 1 capsule by mouth daily.  . fish oil-omega-3 fatty acids 1000 MG capsule  Take 2 g by mouth daily.    . furosemide (LASIX) 40 MG tablet Take 1 tablet (40 mg total) by mouth as needed.  Marland Kitchen glucosamine-chondroitin 500-400 MG tablet Take 1 tablet by mouth 2 (two) times daily.    . IRON PO Take 45 mg by mouth daily.   . Magnesium 250 MG TABS Take 1 tablet by mouth 2 (two) times daily.   . metoprolol succinate (TOPROL-XL) 25 MG 24 hr tablet Take 25 tablets by mouth at bedtime. Pt takes 12.5 mg daily at bedtime  . Multiple Minerals-Vitamins  (CITRACAL PLUS PO) Take 1 capsule by mouth daily.   . polyethylene glycol (MIRALAX / GLYCOLAX) packet Take 17 g by mouth as needed for moderate constipation.   . traMADol (ULTRAM) 50 MG tablet Take 1 tablet (50 mg total) by mouth every 6 (six) hours as needed.  . vitamin C (ASCORBIC ACID) 500 MG tablet Take 1,000 mg by mouth daily.     No current facility-administered medications on file prior to visit.     Current Problems (verified) Patient Active Problem List   Diagnosis Date Noted  . Chronic combined systolic and diastolic congestive heart failure ([place]) 07/25/2015  . UTI (lower urinary tract infection) 07/24/2015  . Falls 07/24/2015  . Neurodermatitis 11/02/2014  . OAB (overactive bladder) 09/27/2014  . GERD  06/20/2014  . Hyperkalemia (Hyporeninemic HypoAldosteronism) 06/20/2014  . Medication management 12/20/2013  . Hyperlipidemia 09/05/2013  . Essential hypertension 02/27/2013  . Prediabetes 02/27/2013  . Vitamin D deficiency 02/27/2013  . Obstructive chronic bronchitis without exacerbation ([place]) 02/27/2013  . Chronic systolic CHF (EF 123456) [name]  . Automatic implantable cardioverter-defibrillator in situ 04/08/2010  . ASCAD s/p CABG (1992) 09/29/2008    Objective:     Blood pressure 118/74, pulse 78, temperature 97.5 F (36.4 C), resp. rate 14, height 5\' 11"  (1.803 m), weight 142 lb 3.2 oz (64.5 kg), SpO2 98 %.  General Appearance:  Alert  WD/WN, male  in no apparent distress. Eyes: PERRLA, EOMs nl, conjunctiva normal, normal fundi and vessels. Sinuses: No frontal/maxillary tenderness ENT/Mouth: EACs patent / TMs  nl. Nares clear without erythema, swelling, mucoid exudates. Oral hygiene is good. No erythema, swelling, or exudate. Tongue normal, non-obstructing. Tonsils not swollen or erythematous. Hearing normal.  Neck: Supple, thyroid normal. No bruits, nodes or JVD. Respiratory: Respiratory effort normal.  BS equal and decreased  bilateral without rales, rhonci,  wheezing or stridor. Cardio: Heart sounds are normal with regular rate and rhythm and no murmurs, rubs or gallops. Peripheral pulses are normal and equal bilaterally without edema. No aortic or femoral bruits. Chest: Barrel configured with median sternotomy scar.  Abdomen: Flat, soft, with nl bowel sounds. Nontender, no guarding, rebound, hernias, masses, or organomegaly.  Lymphatics: Non tender without lymphadenopathy.  Musculoskeletal: Full ROM all peripheral extremities, joint stability, 4/5 strength, and  Antalgic gait with cane.  Skin: Warm and dry without rashes, lesions, cyanosis, clubbing or  ecchymosis.  Neuro: Cranial nerves intact, reflexes equal bilaterally. Normal muscle tone, no cerebellar symptoms.  Pysch: Alert and oriented X 3 with normal affect, insight and judgment appropriate.     Vicie Mutters, PA-C   01/17/2016

## 2016-01-18 LAB — HEMOGLOBIN A1C
HEMOGLOBIN A1C: 5.5 % (ref <5.7)
Mean Plasma Glucose: 111 mg/dL

## 2016-01-31 ENCOUNTER — Encounter: Payer: Self-pay | Admitting: Internal Medicine

## 2016-01-31 ENCOUNTER — Ambulatory Visit (INDEPENDENT_AMBULATORY_CARE_PROVIDER_SITE_OTHER): Payer: Medicare Other | Admitting: Internal Medicine

## 2016-01-31 VITALS — BP 122/62 | HR 75 | Ht 71.0 in | Wt 141.2 lb

## 2016-01-31 DIAGNOSIS — I493 Ventricular premature depolarization: Secondary | ICD-10-CM | POA: Diagnosis not present

## 2016-01-31 DIAGNOSIS — Z79899 Other long term (current) drug therapy: Secondary | ICD-10-CM

## 2016-01-31 DIAGNOSIS — I255 Ischemic cardiomyopathy: Secondary | ICD-10-CM

## 2016-01-31 DIAGNOSIS — I2589 Other forms of chronic ischemic heart disease: Secondary | ICD-10-CM

## 2016-01-31 DIAGNOSIS — Z95810 Presence of automatic (implantable) cardiac defibrillator: Secondary | ICD-10-CM | POA: Diagnosis not present

## 2016-01-31 DIAGNOSIS — I5022 Chronic systolic (congestive) heart failure: Secondary | ICD-10-CM | POA: Diagnosis not present

## 2016-01-31 MED ORDER — AMIODARONE HCL 200 MG PO TABS: 100.0000 mg | ORAL_TABLET | Freq: Every day | ORAL | 3 refills | Status: DC

## 2016-01-31 NOTE — Patient Instructions (Addendum)
Medication Instructions:  Your physician has recommended you make the following change in your medication:  1) DECREASE Amiodarone to 100 mg (half tablet) by mouth Monday, Wednesday, Friday    Labwork: none  Testing/Procedures: none  Follow-Up:  Remote monitoring is used to monitor your Pacemaker or ICD from home. This monitoring reduces the number of office visits required to check your device to one time per year. It allows [place] to keep an eye on the functioning of your device to ensure it is working properly. You are scheduled for a device check from home on 05/01/2016. You may send your transmission at any time that day. If you have a wireless device, the transmission will be sent automatically. After your physician reviews your transmission, you will receive a postcard with your next transmission date.  Your physician wants you to follow-up in: 6 months with Dr. Caryl Comes. You will receive a reminder letter in the mail two months in advance. If you don't receive a letter, please call our office to schedule the follow-up appointment.     Any Other Special Instructions Will Be Listed Below (If Applicable).     If you need a refill on your cardiac medications before your next appointment, please call your pharmacy.

## 2016-01-31 NOTE — Progress Notes (Signed)
Patient Care Team: Unk Pinto, MD as PCP - General (Internal Medicine) Unk Pinto, MD (Internal Medicine) Darleen Crocker, MD as Consulting Physician (Ophthalmology) Deboraha Sprang, MD as Consulting Physician (Cardiology)   HPI  Richard Moody is a 82 y.o. male seen in followup for ICD implanted in 2010.  He has a history of ischemic heart disease with prior bypass surgery depressed left ventricular function with EFof 32%. He has  chronic systolic heart failure but has done relatively well.   He was noted to have significant PVCs that we were concerned about injury to his cardiomyopathy  Amiodarone was initiated  He was also having problems with lower blood pressure. His Florinef was discontinued quinapril was decreased    Date TSH LFTs PFTs  8/17  3.8 13    11  /17  4.26 15         Past Medical History:  Diagnosis Date  . Ganglion cyst of wrist    LEFT WRIST  . GERD (gastroesophageal reflux disease)   . Hx of CABG 1992  . Hyperlipidemia   . ICD (implantable cardiac defibrillator) in place August 2010  . Ischemic cardiomyopathy    EF 32%  . MI, old    INFERIOR LATERAL  . Normal nuclear stress test June 2010   No ischemia. EF 32%  . OA (osteoarthritis) of knee   . Pre-diabetes   . S/P cardiac cath 2006   Occluded SVG to OM. Managed medically  . Vitamin D deficiency     Past Surgical History:  Procedure Laterality Date  . CARDIAC CATHETERIZATION  10/14/2004   THERE IS POSTERIOR LATERAL HYPOKINESIA. EF 30-35%  . CARDIAC CATHETERIZATION  2006   OCCLUDED SVG TO OM  . CARDIAC DEFIBRILLATOR PLACEMENT  10/2008  . CORONARY ARTERY BYPASS GRAFT  1992  . INGUINAL HERNIA REPAIR    . KNEE SURGERY     RIGHT KNEE  . NUCLEAR STRESS TEST  2010   EF 32%. No ischemia    Current Outpatient Prescriptions  Medication Sig Dispense Refill  . acetaminophen (TYLENOL) 500 MG tablet Take 500 mg by mouth as directed.    Marland Kitchen amiodarone (PACERONE) 200 MG tablet Take 1  tablet (200 mg total) by mouth daily. 10 tablet 0  . aspirin 81 MG tablet Take 81 mg by mouth 3 (three) times a week.      . Cholecalciferol (VITAMIN D3) 5000 UNITS CAPS Take 2 capsules by mouth daily.      . diclofenac sodium (VOLTAREN) 1 % GEL Apply to painful Left shoulder & Knee 2-4 x/daily as needed 400 g 3  . diphenhydrAMINE (BENADRYL) 25 MG tablet Take 25 mg by mouth at bedtime as needed.    Marland Kitchen esomeprazole (NEXIUM) 40 MG capsule Take 1 capsule by mouth daily.    . fish oil-omega-3 fatty acids 1000 MG capsule Take 2 g by mouth daily.      . furosemide (LASIX) 40 MG tablet Take 1 tablet (40 mg total) by mouth as needed. 90 tablet 1  . glucosamine-chondroitin 500-400 MG tablet Take 1 tablet by mouth 2 (two) times daily.      . IRON PO Take 45 mg by mouth daily.     . Magnesium 250 MG TABS Take 1 tablet by mouth 2 (two) times daily.     . metoprolol succinate (TOPROL-XL) 25 MG 24 hr tablet Take 25 tablets by mouth at bedtime. Pt takes 12.5 mg daily at bedtime    .  Multiple Minerals-Vitamins (CITRACAL PLUS PO) Take 1 capsule by mouth daily.     . polyethylene glycol (MIRALAX / GLYCOLAX) packet Take 17 g by mouth as needed for moderate constipation.     . traMADol (ULTRAM) 50 MG tablet Take 1 tablet (50 mg total) by mouth every 6 (six) hours as needed. 90 tablet 5  . vitamin C (ASCORBIC ACID) 500 MG tablet Take 1,000 mg by mouth daily.       No current facility-administered medications for this visit.     Allergies  Allergen Reactions  . Lipitor [Atorvastatin]     Elevated cpk   . Mobic [Meloxicam] Other (See Comments)    edema  . Nsaids Other (See Comments)    dyspepsia    Review of Systems negative except from HPI and PMH  Physical Exam BP 122/62   Pulse 75   Ht 5\' 11"  (1.803 m)   Wt 141 lb 3.2 oz (64 kg)   SpO2 97%   BMI 19.69 kg/m  Well developed and cachectic older man in no acute distress HENT normal Neck supple with JVP-flat Clear Device pocket well healed;  without hematoma or erythema.  There is no tethering  Regular rate and rhythm, no murmurs or gallops Abd-soft with active BS No Clubbing cyanosis edema Skin-warm and dry A & Oriented  Grossly normal sensory and motor function  ECG Apacing  Prior IMI LVH repol   Assessment and  Plan  Ischemic cardiomyopathy    Congestive heart failure-chronic-systolic     Implantable  defibrillator-[employer]  The patient's device was interrogated.  The information was reviewed. No changes were made in the programming.    PVCs   PVC burden continues to diminish. Over the last 4 months 32.5 per hour>> 2.1/hr  Tolerating amio will reduce dose to 200 MWF  He began to have a discussion regarding generator replacement. He is seen always, he will need pacemaker function replacement. He may not need defibrillator function replacement. His defibrillator has never gone off. We have discussed the attenuating benefit of his defibrillator in the context of age and the emerging of competing modes of dying.   More than 50% of 45 min was spent in counseling related to the above

## 2016-02-01 LAB — CUP PACEART INCLINIC DEVICE CHECK
Battery Voltage: 2.63 V
Brady Statistic AP VP Percent: 0.01 %
Brady Statistic AS VP Percent: 0 %
Brady Statistic AS VS Percent: 1.73 %
Brady Statistic RA Percent Paced: 98.24 %
HIGH POWER IMPEDANCE MEASURED VALUE: 43 Ohm
HIGH POWER IMPEDANCE MEASURED VALUE: 57 Ohm
Implantable Lead Implant Date: 20100805
Implantable Lead Location: 753860
Implantable Lead Model: 6947
Implantable Pulse Generator Implant Date: 20100805
Lead Channel Impedance Value: 418 Ohm
Lead Channel Impedance Value: 494 Ohm
Lead Channel Pacing Threshold Amplitude: 0.75 V
Lead Channel Pacing Threshold Pulse Width: 0.4 ms
Lead Channel Sensing Intrinsic Amplitude: 11.6 mV
Lead Channel Sensing Intrinsic Amplitude: 2 mV
MDC IDC LEAD IMPLANT DT: 20100805
MDC IDC LEAD LOCATION: 753859
MDC IDC MSMT LEADCHNL RA PACING THRESHOLD PULSEWIDTH: 0.4 ms
MDC IDC MSMT LEADCHNL RV PACING THRESHOLD AMPLITUDE: 1 V
MDC IDC SESS DTM: 20171130215120
MDC IDC SET LEADCHNL RA PACING AMPLITUDE: 2 V
MDC IDC SET LEADCHNL RV PACING AMPLITUDE: 2.5 V
MDC IDC SET LEADCHNL RV PACING PULSEWIDTH: 0.4 ms
MDC IDC SET LEADCHNL RV SENSING SENSITIVITY: 0.3 mV
MDC IDC STAT BRADY AP VS PERCENT: 98.26 %
MDC IDC STAT BRADY RV PERCENT PACED: 0.01 %

## 2016-02-05 ENCOUNTER — Other Ambulatory Visit: Payer: Self-pay | Admitting: *Deleted

## 2016-02-05 DIAGNOSIS — Z1211 Encounter for screening for malignant neoplasm of colon: Secondary | ICD-10-CM

## 2016-02-05 LAB — POC HEMOCCULT BLD/STL (HOME/3-CARD/SCREEN)
FECAL OCCULT BLD: NEGATIVE
FECAL OCCULT BLD: NEGATIVE
FECAL OCCULT BLD: NEGATIVE

## 2016-03-04 ENCOUNTER — Ambulatory Visit (INDEPENDENT_AMBULATORY_CARE_PROVIDER_SITE_OTHER): Payer: Medicare Other

## 2016-03-04 DIAGNOSIS — I5022 Chronic systolic (congestive) heart failure: Secondary | ICD-10-CM

## 2016-03-04 DIAGNOSIS — Z95810 Presence of automatic (implantable) cardiac defibrillator: Secondary | ICD-10-CM | POA: Diagnosis not present

## 2016-03-04 NOTE — Progress Notes (Signed)
EPIC Encounter for ICM Monitoring  Patient Name: Richard Moody is a 83 y.o. male Date: 03/04/2016 Primary Care Physican: Alesia Richards, MD Primary [place] Electrophysiologist: Faustino Congress Weight:unknown           Heart Failure questions reviewed, pt asymptomatic   Thoracic impedance normal.  Recommendations: No changes.  Reinforced to limit low salt food choices to 2000 mg day and limiting fluid intake to < 2 liters per day. Encouraged to call for fluid symptoms.    Follow-up plan: ICM clinic phone appointment on 04/04/2016.  Copy of ICM check sent to primary cardiologist and device physician.   3 month ICM trend : 03/04/2016   1 Year ICM trend:      Rosalene Billings, RN 03/04/2016 9:49 AM

## 2016-03-30 ENCOUNTER — Other Ambulatory Visit: Payer: Self-pay | Admitting: Internal Medicine

## 2016-04-04 ENCOUNTER — Ambulatory Visit (INDEPENDENT_AMBULATORY_CARE_PROVIDER_SITE_OTHER): Payer: Medicare Other

## 2016-04-04 DIAGNOSIS — Z95810 Presence of automatic (implantable) cardiac defibrillator: Secondary | ICD-10-CM

## 2016-04-04 DIAGNOSIS — I5022 Chronic systolic (congestive) heart failure: Secondary | ICD-10-CM

## 2016-04-04 NOTE — Progress Notes (Signed)
EPIC Encounter for ICM Monitoring  Patient Name: Richard Moody is a 83 y.o. male Date: 04/04/2016 Primary Care Physican: Alesia Richards, MD Primary [place] Electrophysiologist: Faustino Congress Weight:unknown       Spoke with wife and patient.  Heart Failure questions reviewed, pt asymptomatic   Thoracic impedance normal   Recommendations: No changes. Reminded to limit dietary salt intake to 2000 mg/day and fluid intake to < 2 liters/day. Encouraged to call for fluid symptoms.  Follow-up plan: ICM clinic phone appointment on 05/05/2016.  Copy of ICM check sent to device physician.   3 month ICM trend: 04/04/2016   1 Year ICM trend:      Rosalene Billings, RN 04/04/2016 8:13 AM

## 2016-04-15 DIAGNOSIS — M2041 Other hammer toe(s) (acquired), right foot: Secondary | ICD-10-CM | POA: Diagnosis not present

## 2016-04-15 DIAGNOSIS — L84 Corns and callosities: Secondary | ICD-10-CM | POA: Diagnosis not present

## 2016-04-15 DIAGNOSIS — L602 Onychogryphosis: Secondary | ICD-10-CM | POA: Diagnosis not present

## 2016-04-15 DIAGNOSIS — M2042 Other hammer toe(s) (acquired), left foot: Secondary | ICD-10-CM | POA: Diagnosis not present

## 2016-04-23 ENCOUNTER — Encounter: Payer: Self-pay | Admitting: Internal Medicine

## 2016-04-23 ENCOUNTER — Ambulatory Visit (INDEPENDENT_AMBULATORY_CARE_PROVIDER_SITE_OTHER): Payer: Medicare Other | Admitting: Internal Medicine

## 2016-04-23 VITALS — BP 106/58 | HR 64 | Temp 97.7°F | Resp 16 | Ht 71.0 in | Wt 142.0 lb

## 2016-04-23 DIAGNOSIS — I951 Orthostatic hypotension: Secondary | ICD-10-CM | POA: Diagnosis not present

## 2016-04-23 DIAGNOSIS — E782 Mixed hyperlipidemia: Secondary | ICD-10-CM

## 2016-04-23 DIAGNOSIS — E559 Vitamin D deficiency, unspecified: Secondary | ICD-10-CM

## 2016-04-23 DIAGNOSIS — R7303 Prediabetes: Secondary | ICD-10-CM | POA: Diagnosis not present

## 2016-04-23 DIAGNOSIS — I255 Ischemic cardiomyopathy: Secondary | ICD-10-CM

## 2016-04-23 DIAGNOSIS — Z79899 Other long term (current) drug therapy: Secondary | ICD-10-CM

## 2016-04-23 LAB — CBC WITH DIFFERENTIAL/PLATELET
BASOS ABS: 67 {cells}/uL (ref 0–200)
Basophils Relative: 1 %
Eosinophils Absolute: 134 cells/uL (ref 15–500)
Eosinophils Relative: 2 %
HEMATOCRIT: 34.2 % — AB (ref 38.5–50.0)
Hemoglobin: 11.4 g/dL — ABNORMAL LOW (ref 13.2–17.1)
LYMPHS PCT: 18 %
Lymphs Abs: 1206 cells/uL (ref 850–3900)
MCH: 31.5 pg (ref 27.0–33.0)
MCHC: 33.3 g/dL (ref 32.0–36.0)
MCV: 94.5 fL (ref 80.0–100.0)
MONO ABS: 737 {cells}/uL (ref 200–950)
MPV: 10.2 fL (ref 7.5–12.5)
Monocytes Relative: 11 %
NEUTROS PCT: 68 %
Neutro Abs: 4556 cells/uL (ref 1500–7800)
Platelets: 234 10*3/uL (ref 140–400)
RBC: 3.62 MIL/uL — AB (ref 4.20–5.80)
RDW: 14 % (ref 11.0–15.0)
WBC: 6.7 10*3/uL (ref 3.8–10.8)

## 2016-04-23 LAB — HEPATIC FUNCTION PANEL
ALT: 11 U/L (ref 9–46)
AST: 13 U/L (ref 10–35)
Albumin: 3.7 g/dL (ref 3.6–5.1)
Alkaline Phosphatase: 94 U/L (ref 40–115)
Bilirubin, Direct: 0.1 mg/dL (ref <=0.2)
Indirect Bilirubin: 0.3 mg/dL (ref 0.2–1.2)
TOTAL PROTEIN: 6.3 g/dL (ref 6.1–8.1)
Total Bilirubin: 0.4 mg/dL (ref 0.2–1.2)

## 2016-04-23 LAB — BASIC METABOLIC PANEL WITH GFR
BUN: 29 mg/dL — AB (ref 7–25)
CALCIUM: 8.9 mg/dL (ref 8.6–10.3)
CO2: 24 mmol/L (ref 20–31)
CREATININE: 1.35 mg/dL — AB (ref 0.70–1.11)
Chloride: 106 mmol/L (ref 98–110)
GFR, EST AFRICAN AMERICAN: 55 mL/min — AB (ref >=60)
GFR, EST NON AFRICAN AMERICAN: 47 mL/min — AB (ref >=60)
GLUCOSE: 102 mg/dL — AB (ref 65–99)
Potassium: 4.5 mmol/L (ref 3.5–5.3)
Sodium: 138 mmol/L (ref 135–146)

## 2016-04-23 LAB — LIPID PANEL
CHOLESTEROL: 172 mg/dL (ref <200)
HDL: 65 mg/dL (ref >40)
LDL Cholesterol: 88 mg/dL (ref <100)
TRIGLYCERIDES: 97 mg/dL (ref <150)
Total CHOL/HDL Ratio: 2.6 Ratio (ref <5.0)
VLDL: 19 mg/dL (ref <30)

## 2016-04-23 LAB — HEMOGLOBIN A1C
HEMOGLOBIN A1C: 5.6 % (ref <5.7)
MEAN PLASMA GLUCOSE: 114 mg/dL

## 2016-04-23 LAB — TSH: TSH: 3.38 mIU/L (ref 0.40–4.50)

## 2016-04-23 NOTE — Patient Instructions (Signed)
Orthostatic Hypotension Orthostatic hypotension is a sudden drop in blood pressure that happens when you quickly change positions, such as when you get up from a seated or lying position. Blood pressure is a measurement of how strongly, or weakly, your blood is pressing against the walls of your arteries. Arteries are blood vessels that carry blood from your heart throughout your body. When blood pressure is too low, you may not get enough blood to your brain or to the rest of your organs. This can cause weakness, light-headedness, rapid heartbeat, and fainting. This can last for just a few seconds or for up to a few minutes. Orthostatic hypotension is usually not a serious problem. However, if it happens frequently or gets worse, it may be a sign of something more serious. What are the causes? This condition may be caused by:  Sudden changes in posture, such as standing up quickly after you have been sitting or lying down.  Blood loss.  Loss of body fluids (dehydration).  Heart problems.  Hormone (endocrine) problems.  Pregnancy.  Severe infection.  Lack of certain nutrients.  Severe allergic reactions (anaphylaxis).  Certain medicines, such as blood pressure medicine or medicines that make the body lose excess fluids (diuretics). Sometimes, this condition can be caused by not taking medicine as directed, such as taking too much of a certain medicine. What increases the risk? Certain factors can make you more likely to develop orthostatic hypotension, including:  Age. Risk increases as you get older.  Conditions that affect the heart or the central nervous system.  Taking certain medicines, such as blood pressure medicine or diuretics.  Being pregnant. What are the signs or symptoms? Symptoms of this condition may include:  Weakness.  Light-headedness.  Dizziness.  Blurred vision.  Fatigue.  Rapid heartbeat.  Fainting, in severe cases. How is this diagnosed? This  condition is diagnosed based on:  Your medical history.  Your symptoms.  Your blood pressure measurement. Your health care provider will check your blood pressure when you are:  Lying down.  Sitting.  Standing. A blood pressure reading is recorded as two numbers, such as "120 over 80" (or 120/80). The first ("top") number is called the systolic pressure. It is a measure of the pressure in your arteries as your heart beats. The second ("bottom") number is called the diastolic pressure. It is a measure of the pressure in your arteries when your heart relaxes between beats. Blood pressure is measured in a unit called mm Hg. Healthy blood pressure for adults is 120/80. If your blood pressure is below 90/60, you may be diagnosed with hypotension. Other information or tests that may be used to diagnose orthostatic hypotension include:  Your other vital signs, such as your heart rate and temperature.  Blood tests.  Tilt table test. For this test, you will be safely secured to a table that moves you from a lying position to an upright position. Your heart rhythm and blood pressure will be monitored during the test. How is this treated? Treatment for this condition may include:  Changing your diet. This may involve eating more salt (sodium) or drinking more water.  Taking medicines to raise your blood pressure.  Changing the dosage of certain medicines you are taking that might be lowering your blood pressure.  Wearing compression stockings. These stockings help to prevent blood clots and reduce swelling in your legs. In some cases, you may need to go to the hospital for:  Fluid replacement. This means you will   receive fluids through an IV tube.  Blood replacement. This means you will receive donated blood through an IV tube (transfusion).  Treating an infection or heart problems, if this applies.  Monitoring. You may need to be monitored while medicines that you are taking wear  off. Follow these instructions at home: Eating and drinking  Drink enough fluid to keep your urine clear or pale yellow.  Eat a healthy diet and follow instructions from your health care provider about eating or drinking restrictions. A healthy diet includes:  Fresh fruits and vegetables.  Whole grains.  Lean meats.  Low-fat dairy products.  Eat extra salt only as directed. Do not add extra salt to your diet unless your health care provider told you to do that.  Eat frequent, small meals.  Avoid standing up suddenly after eating. Medicines  Take over-the-counter and prescription medicines only as told by your health care provider.  Follow instructions from your health care provider about changing the dosage of your current medicines, if this applies.  Do not stop or adjust any of your medicines on your own. General instructions  Wear compression stockings as told by your health care provider.  Get up slowly from lying down or sitting positions. This gives your blood pressure a chance to adjust.  Avoid hot showers and excessive heat as directed by your health care provider.  Return to your normal activities as told by your health care provider. Ask your health care provider what activities are safe for you.  Do not use any products that contain nicotine or tobacco, such as cigarettes and e-cigarettes. If you need help quitting, ask your health care provider.  Keep all follow-up visits as told by your health care provider. This is important. Contact a health care provider if:  You vomit.  You have diarrhea.  You have a fever for more than 2-3 days.  You feel more thirsty than usual.  You feel weak and tired. Get help right away if:  You have chest pain.  You have a fast or irregular heartbeat.  You develop numbness in any part of your body.  You cannot move your arms or your legs.  You have trouble speaking.  You become sweaty or feel lightheaded.  You  faint.  You feel short of breath.  You have trouble staying awake.  You feel confused. This information is not intended to replace advice given to you by your health care provider. Make sure you discuss any questions you have with your health care provider. Document Released: 02/07/2002 Document Revised: 11/06/2015 Document Reviewed: 08/10/2015 Elsevier Interactive Patient Education  2017 [place].  +++++++++++++++++++++++++++ Recommend Adult Low Dose Aspirin or  coated  Aspirin 81 mg daily  To reduce risk of Colon Cancer 20 %,  Skin Cancer 26 % ,  Melanoma 46%  and  Pancreatic cancer 60% +++++++++++++++++++++++++ Vitamin D goal  is between 70-100.  Please make sure that you are taking your Vitamin D as directed.  It is very important as a natural anti-inflammatory  helping hair, skin, and nails, as well as reducing stroke and heart attack risk.  It helps your bones and helps with mood. It also decreases numerous cancer risks so please take it as directed.  Low Vit D is associated with a 200-300% higher risk for CANCER  and 200-300% higher risk for HEART   ATTACK  &  STROKE.   .....................................Marland Kitchen It is also associated with higher death rate at younger ages,  autoimmune diseases  like Rheumatoid arthritis, Lupus, Multiple Sclerosis.    Also many other serious conditions, like depression, Alzheimer's Dementia, infertility, muscle aches, fatigue, fibromyalgia - just to name a few. ++++++++++++++++++++ Recommend the book "The END of DIETING" by Dr Excell Seltzer  & the book "The END of DIABETES " by Dr Excell Seltzer At [email or web address] - get book & Audio CD's    Being diabetic has a  300% increased risk for heart attack, stroke, cancer, and alzheimer- type vascular dementia. It is very important that you work harder with diet by avoiding all foods that are white. Avoid white rice (brown & wild rice is OK), white potatoes (sweetpotatoes in moderation is OK), White bread  or wheat bread or anything made out of white flour like bagels, donuts, rolls, buns, biscuits, cakes, pastries, cookies, pizza crust, and pasta (made from white flour & egg whites) - vegetarian pasta or spinach or wheat pasta is OK. Multigrain breads like Arnold's or Pepperidge Farm, or multigrain sandwich thins or flatbreads.  Diet, exercise and weight loss can reverse and cure diabetes in the early stages.  Diet, exercise and weight loss is very important in the control and prevention of complications of diabetes which affects every system in your body, ie. Brain - dementia/stroke, eyes - glaucoma/blindness, heart - heart attack/heart failure, kidneys - dialysis, stomach - gastric paralysis, intestines - malabsorption, nerves - severe painful neuritis, circulation - gangrene & loss of a leg(s), and finally cancer and Alzheimers.    I recommend avoid fried & greasy foods,  sweets/candy, white rice (brown or wild rice or Quinoa is OK), white potatoes (sweet potatoes are OK) - anything made from white flour - bagels, doughnuts, rolls, buns, biscuits,white and wheat breads, pizza crust and traditional pasta made of white flour & egg white(vegetarian pasta or spinach or wheat pasta is OK).  Multi-grain bread is OK - like multi-grain flat bread or sandwich thins. Avoid alcohol in excess. Exercise is also important.    Eat all the vegetables you want - avoid meat, especially red meat and dairy - especially cheese.  Cheese is the most concentrated form of trans-fats which is the worst thing to clog up our arteries. Veggie cheese is OK which can be found in the fresh produce section at [employer] or [employer] or [employer]  +++++++++++++++++++++ DASH Eating Plan  DASH stands for "Dietary Approaches to Stop Hypertension."   The DASH eating plan is a healthy eating plan that has been shown to reduce high blood pressure (hypertension). Additional health benefits may include reducing the risk of type 2 diabetes  mellitus, heart disease, and stroke. The DASH eating plan may also help with weight loss. WHAT DO I NEED TO KNOW ABOUT THE DASH EATING PLAN? For the DASH eating plan, you will follow these general guidelines:  Choose foods with a percent daily value for sodium of less than 5% (as listed on the food label).  Use salt-free seasonings or herbs instead of table salt or sea salt.  Check with your health care provider or pharmacist before using salt substitutes.  Eat lower-sodium products, often labeled as "lower sodium" or "no salt added."  Eat fresh foods.  Eat more vegetables, fruits, and low-fat dairy products.  Choose whole grains. Look for the word "whole" as the first word in the ingredient list.  Choose fish   Limit sweets, desserts, sugars, and sugary drinks.  Choose heart-healthy fats.  Eat veggie cheese   Eat more home-cooked food and less restaurant, buffet, and  fast food.  Limit fried foods.  Cook foods using methods other than frying.  Limit canned vegetables. If you do use them, rinse them well to decrease the sodium.  When eating at a restaurant, ask that your food be prepared with less salt, or no salt if possible.                      WHAT FOODS CAN I EAT? Read Dr Fara Olden Fuhrman's books on The End of Dieting & The End of Diabetes  Grains Whole grain or whole wheat bread. Brown rice. Whole grain or whole wheat pasta. Quinoa, bulgur, and whole grain cereals. Low-sodium cereals. Corn or whole wheat flour tortillas. Whole grain cornbread. Whole grain crackers. Low-sodium crackers.  Vegetables Fresh or frozen vegetables (raw, steamed, roasted, or grilled). Low-sodium or reduced-sodium tomato and vegetable juices. Low-sodium or reduced-sodium tomato sauce and paste. Low-sodium or reduced-sodium canned vegetables.   Fruits All fresh, canned (in natural juice), or frozen fruits.  Protein Products  All fish and seafood.  Dried beans, peas, or lentils. Unsalted nuts  and seeds. Unsalted canned beans.  Dairy Low-fat dairy products, such as skim or 1% milk, 2% or reduced-fat cheeses, low-fat ricotta or cottage cheese, or plain low-fat yogurt. Low-sodium or reduced-sodium cheeses.  Fats and Oils Tub margarines without trans fats. Light or reduced-fat mayonnaise and salad dressings (reduced sodium). Avocado. Safflower, olive, or canola oils. Natural peanut or almond butter.  Other Unsalted popcorn and pretzels. The items listed above may not be a complete list of recommended foods or beverages. Contact your dietitian for more options.  +++++++++++++++  WHAT FOODS ARE NOT RECOMMENDED? Grains/ White flour or wheat flour White bread. White pasta. White rice. Refined cornbread. Bagels and croissants. Crackers that contain trans fat.  Vegetables  Creamed or fried vegetables. Vegetables in a . Regular canned vegetables. Regular canned tomato sauce and paste. Regular tomato and vegetable juices.  Fruits Dried fruits. Canned fruit in light or heavy syrup. Fruit juice.  Meat and Other Protein Products Meat in general - RED meat & White meat.  Fatty cuts of meat. Ribs, chicken wings, all processed meats as bacon, sausage, bologna, salami, fatback, hot dogs, bratwurst and packaged luncheon meats.  Dairy Whole or 2% milk, cream, half-and-half, and cream cheese. Whole-fat or sweetened yogurt. Full-fat cheeses or blue cheese. Non-dairy creamers and whipped toppings. Processed cheese, cheese spreads, or cheese curds.  Condiments Onion and garlic salt, seasoned salt, table salt, and sea salt. Canned and packaged gravies. Worcestershire sauce. Tartar sauce. Barbecue sauce. Teriyaki sauce. Soy sauce, including reduced sodium. Steak sauce. Fish sauce. Oyster sauce. Cocktail sauce. Horseradish. Ketchup and mustard. Meat flavorings and tenderizers. Bouillon cubes. Hot sauce. Tabasco sauce. Marinades. Taco seasonings. Relishes.  Fats and Oils Butter, stick margarine,  lard, shortening and bacon fat. Coconut, palm kernel, or palm oils. Regular salad dressings.  Pickles and olives. Salted popcorn and pretzels.  The items listed above may not be a complete list of foods and beverages to avoid.

## 2016-04-23 NOTE — Progress Notes (Signed)
[facility] Unk Pinto, M.D.        Richard Moody. Silverio Lay, P.A.-C       Starlyn Skeans, P.A.-C  [facility]                [address] [place] [id number] Telephone [phone] Telefax [phone] ______________________________________________________     This very nice 83 y.o. MWM presents for 6 month follow up with Hypertension, Hyperlipidemia, Pre-Diabetes and Vitamin D Deficiency.      Patient is treated for HTN(2003)and had undergone CABG in 1992.  BP has been controlled at home. In 2010, he had a AICD/defib implanted by Dr Caryl Comes.  Patient has chronic systolic CHF w/EF 123456. Today's BP is 106/58. Patient has had no complaints of any cardiac type chest pain, palpitations, dyspnea/orthopnea/PND, dizziness, claudication, or dependent edema.  Patient has hx/o Hyporeneimic hypoaldosteronism with  hyperkalemia treated w/Florinef later d/c'd consequent of volume overload. .      Hyperlipidemia is controlled with diet & meds. Patient denies myalgias or other med SE's. Last Lipids were at goal: Lab Results  Component Value Date   CHOL 177 01/17/2016   HDL 69 01/17/2016   LDLCALC 91 01/17/2016   TRIG 84 01/17/2016   CHOLHDL 2.6 01/17/2016      Also, the patient has history of PreDiabetes (A1c 5.8% in 2011) and has had no symptoms of reactive hypoglycemia, diabetic polys, paresthesias or visual blurring.  Last A1c was normal & at goal: Lab Results  Component Value Date   HGBA1C 5.5 01/17/2016      Further, the patient also has history of Vitamin D Deficiency ("22" in 2008) and supplements vitamin D without any suspected side-effects. Last vitamin D was at goal:  Lab Results  Component Value Date   VD25OH 103 (H) 10/15/2015   Current Outpatient Prescriptions on File Prior to Visit  Medication Sig  . acetaminophen  500 MG Take 500 mg by mouth as directed.  Marland Kitchen amiodarone  200 MG Take 0.5 tablets  (100 mg total) by mouth daily.  Marland Kitchen aspirin 81 MG  Take  3  times a week.    Marland Kitchen VITAMIN D 5000 UNITS Take 2 capsules by mouth daily.    . diclofenac 1 % GEL Apply to painful Left shoulder & Knee 2-4 x/daily as needed  . diphenhydrAMINE 25 MG Take 25 mg by mouth at bedtime as needed.  Marland Kitchen esomeprazole  40 MG  Take 1 capsule by mouth daily.  . fish oil-omega-3  1000 MG Take 2 g by mouth daily.    . furosemide  40 MG Take 1 tablet (40 mg total) by mouth as needed.  Marland Kitchen glucosamine-chondroitin  Take 1 tablet by mouth 2 (two) times daily.    . IRON Take 45 mg by mouth daily.   . Magnesium 250 MG  Take 1 tab 2  times daily.   . metoprolol succ-XL 25 MG TAKE 1 TABLET DAILY  . Multi-Min w/Vit CITRACAL +  Take 1 caps daily.   Marland Kitchen MIRALAX  Take 17 g  as needed for moderate constipation.   . traMADol 50 MG  Take 1 tab every 6 (six) hours as needed.  . vitamin C  500 MG Take 1,000 mg by mouth daily.     Allergies  Allergen Reactions  . Lipitor [Atorvastatin] Elevated cpk  . Mobic [Meloxicam] edema  .  Nsaids dyspepsia   PMHx:   Past Medical History:  Diagnosis Date  . Ganglion cyst of wrist    LEFT WRIST  . GERD (gastroesophageal reflux disease)   . Hx of CABG 1992  . Hyperlipidemia   . ICD (implantable cardiac defibrillator) in place August 2010  . Ischemic cardiomyopathy    EF 32%  . MI, old    INFERIOR LATERAL  . Normal nuclear stress test June 2010   No ischemia. EF 32%  . OA (osteoarthritis) of knee   . Pre-diabetes   . S/P cardiac cath 2006   Occluded SVG to OM. Managed medically  . Vitamin D deficiency    Immunization History  Administered Date(s) Administered  . Influenza, High Dose Seasonal PF 12/07/2013, 01/04/2015  . Influenza-Unspecified 01/01/2013  . Pneumococcal Polysaccharide-23 07/02/2001, 06/01/2013  . Td 10/15/2015  . Tdap 03/04/2003   Past Surgical History:  Procedure Laterality Date  . CARDIAC CATHETERIZATION  10/14/2004   THERE IS POSTERIOR LATERAL HYPOKINESIA. EF  30-35%  . CARDIAC CATHETERIZATION  2006   OCCLUDED SVG TO OM  . CARDIAC DEFIBRILLATOR PLACEMENT  10/2008  . CORONARY ARTERY BYPASS GRAFT  1992  . INGUINAL HERNIA REPAIR    . KNEE SURGERY     RIGHT KNEE  . NUCLEAR STRESS TEST  2010   EF 32%. No ischemia   FHx:    Reviewed / unchanged  SHx:    Reviewed / unchanged  Systems Review:  Constitutional: Denies fever, chills, wt changes, headaches, insomnia, fatigue, night sweats, change in appetite. Eyes: Denies redness, blurred vision, diplopia, discharge, itchy, watery eyes.  ENT: Denies discharge, congestion, post nasal drip, epistaxis, sore throat, earache, hearing loss, dental pain, tinnitus, vertigo, sinus pain, snoring.  CV: Denies chest pain, palpitations, irregular heartbeat, syncope, dyspnea, diaphoresis, orthopnea, PND, claudication or edema. Respiratory: denies cough, dyspnea, DOE, pleurisy, hoarseness, laryngitis, wheezing.  Gastrointestinal: Denies dysphagia, odynophagia, heartburn, reflux, water brash, abdominal pain or cramps, nausea, vomiting, bloating, diarrhea, constipation, hematemesis, melena, hematochezia  or hemorrhoids. Genitourinary: Denies dysuria, frequency, urgency, nocturia, hesitancy, discharge, hematuria or flank pain. Musculoskeletal: Denies arthralgias, myalgias, stiffness, jt. swelling, pain, limping or strain/sprain.  Skin: Denies pruritus, rash, hives, warts, acne, eczema or change in skin lesion(s). Neuro: No weakness, tremor, incoordination, spasms, paresthesia or pain. Psychiatric: Denies confusion, memory loss or sensory loss. Endo: Denies change in weight, skin or hair change.  Heme/Lymph: No excessive bleeding, bruising or enlarged lymph nodes.  Physical Exam  BP  106/58   P 64   T 97.7 F    Resp 16   Ht 5\' 11"     Wt 142 lb    BMI 19.80   Postural BP's: Sitting 119/60 P 60 and standing 120/73 P69   Appears thin, malnourished and in no distress.  Eyes: PERRLA, EOMs, conjunctiva no swelling  or erythema. Sinuses: No frontal/maxillary tenderness ENT/Mouth: EAC's clear, TM's nl w/o erythema, bulging. Nares clear w/o erythema, swelling, exudates. Oropharynx clear without erythema or exudates. Oral hygiene is good. Tongue normal, non obstructing. Hearing intact.  Neck: Supple. Thyroid nl. Car 2+/2+ without bruits, nodes or JVD. Chest: Kyphotic & barrel configured.  Respirations nl with BS clear & equal w/o rales, rhonchi, wheezing or stridor.  Cor: Heart sounds normal w/ regular rate and rhythm without sig. murmurs, gallops, clicks, or rubs. Peripheral pulses normal and equal  without edema.  Abdomen: Soft & bowel sounds normal. Non-tender w/o guarding, rebound, hernias, masses, or organomegaly.  Lymphatics: Unremarkable.  Musculoskeletal: Generalized decrease in  muscle power, tone and bulk with broad based gait stabilized with a cane.  Skin: Warm, dry without exposed rashes, lesions or ecchymosis apparent.  Neuro: Cranial nerves intact, reflexes equal bilaterally. Sensory-motor testing grossly intact. Tendon reflexes grossly intact.  Pysch: Alert & oriented x 3.  Insight and judgement nl & appropriate. No ideations.  Assessment and Plan:  1. Autonomic postural hypotension  - Continue medication, monitor blood pressure at home.  - Continue DASH diet. Reminder to go to the ER if any CP,  SOB, nausea, dizziness, severe HA, changes vision/speech,  left arm numbness and tingling and jaw pain.  - CBC with Differential/Platelet - BASIC METABOLIC PANEL WITH GFR - TSH  2. Hyperlipidemia  - Continue diet/meds, exercise,& lifestyle modifications.  - Continue monitor periodic cholesterol/liver & renal functions   - Hepatic function panel - Lipid panel - TSH  3. Prediabetes  - Continue diet, exercise, lifestyle modifications.  - Monitor appropriate labs.   - Hemoglobin A1c - Insulin, random  4. Vitamin D deficiency  - Continue supplementation. - VITAMIN D 25 Hydroxy   5.  ASCAD s/p CABG (1992)  - Lipid panel  6. Medication management  - CBC with Differential/Platelet - BASIC METABOLIC PANEL WITH GFR - Hepatic function panel - Magnesium - Lipid panel - TSH - Hemoglobin A1c - Insulin, random - VITAMIN D 25 Hydroxy        Recommended regular exercise, BP monitoring, weight control, and discussed med and SE's. Recommended labs to assess and monitor clinical status. Further disposition pending results of labs. Over 30 minutes of exam, counseling, chart review was performed

## 2016-04-24 LAB — INSULIN, RANDOM: Insulin: 12.4 u[IU]/mL (ref 2.0–19.6)

## 2016-04-24 LAB — MAGNESIUM: MAGNESIUM: 2.1 mg/dL (ref 1.5–2.5)

## 2016-04-24 LAB — VITAMIN D 25 HYDROXY (VIT D DEFICIENCY, FRACTURES): Vit D, 25-Hydroxy: 114 ng/mL — ABNORMAL HIGH (ref 30–100)

## 2016-05-05 ENCOUNTER — Ambulatory Visit (INDEPENDENT_AMBULATORY_CARE_PROVIDER_SITE_OTHER): Payer: Medicare Other | Admitting: *Deleted

## 2016-05-05 DIAGNOSIS — Z95810 Presence of automatic (implantable) cardiac defibrillator: Secondary | ICD-10-CM | POA: Diagnosis not present

## 2016-05-05 DIAGNOSIS — I5022 Chronic systolic (congestive) heart failure: Secondary | ICD-10-CM

## 2016-05-05 DIAGNOSIS — I255 Ischemic cardiomyopathy: Secondary | ICD-10-CM

## 2016-05-05 NOTE — Progress Notes (Signed)
EPIC Encounter for ICM Monitoring  Patient Name: Richard Moody is a 83 y.o. male Date: 05/05/2016 Primary Care Physican: Alesia Richards, MD [facility] [place] Electrophysiologist: Faustino Congress Weight:unknown       Heart Failure questions reviewed, pt asymptomatic .   Thoracic impedance normal.  Prescribed and confirmed dosage: Furosemide 40 mg 1 tablet daily as needed  Labs: 04/23/2016 Creatinine 1.35, BUN 29, Potassium 4.5, Sodium 138, EGFR 47-55 01/17/2016 Creatinine 1.04, BUN 27, Potassium 4.7, Sodium 137, EGFR 65-75  10/15/2015 Creatinine 1.05, BUN 29, Potassium 4.7, Sodium 136, EGFR 64-74  09/19/2015 Creatinine 0.93, BUN 23, Potassium 4.7, Sodium 135, EGFR 75-86  08/31/2015 Creatinine 1.01, BUN 27, Potassium 5.6, Sodium 131, EGFR 68-78  07/25/2015 Creatinine 1.29, BUN 37, Potassium 4.2, Sodium 137  07/24/2015 Creatinine 1.64, BUN 50, Potassium 4.6, Sodium 140  07/18/2015 Creatinine 1.30, BUN 34, Potassium 5.0, Sodium 136, EGFR 50-58  06/01/2015 Creatinine 1.03, BUN 32, Potassium 4.6, Sodium 138  04/09/2015 Creatinine 0.89, BUN 24, Potassium 5.1, Sodium 139, EGFR 78->89   Recommendations: No changes. Reminded to limit dietary salt intake to 2000 mg/day and fluid intake to < 2 liters/day. Encouraged to call for fluid symptoms.  Follow-up plan: ICM clinic phone appointment on 06/05/2016.  Copy of ICM check sent to device physician.   3 month ICM trend: 05/05/2016   1 Year ICM trend:      Rosalene Billings, RN 05/05/2016 12:25 PM

## 2016-05-06 ENCOUNTER — Encounter: Payer: Self-pay | Admitting: Cardiology

## 2016-05-06 LAB — CUP PACEART REMOTE DEVICE CHECK
Brady Statistic AP VP Percent: 0.03 %
Brady Statistic AP VS Percent: 93.75 %
Brady Statistic AS VP Percent: 0 %
Brady Statistic AS VS Percent: 6.22 %
Brady Statistic RV Percent Paced: 0.04 %
HIGH POWER IMPEDANCE MEASURED VALUE: 53 Ohm
HighPow Impedance: 43 Ohm
Implantable Lead Implant Date: 20100805
Implantable Lead Location: 753860
Implantable Lead Model: 5076
Lead Channel Impedance Value: 418 Ohm
Lead Channel Sensing Intrinsic Amplitude: 11.5 mV
Lead Channel Sensing Intrinsic Amplitude: 11.5 mV
Lead Channel Sensing Intrinsic Amplitude: 2 mV
Lead Channel Sensing Intrinsic Amplitude: 2 mV
Lead Channel Setting Pacing Pulse Width: 0.4 ms
Lead Channel Setting Sensing Sensitivity: 0.3 mV
MDC IDC LEAD IMPLANT DT: 20100805
MDC IDC LEAD LOCATION: 753859
MDC IDC MSMT BATTERY VOLTAGE: 2.62 V
MDC IDC MSMT LEADCHNL RA IMPEDANCE VALUE: 475 Ohm
MDC IDC PG IMPLANT DT: 20100805
MDC IDC SESS DTM: 20180305081609
MDC IDC SET LEADCHNL RA PACING AMPLITUDE: 2 V
MDC IDC SET LEADCHNL RV PACING AMPLITUDE: 2.5 V
MDC IDC STAT BRADY RA PERCENT PACED: 93.54 %

## 2016-05-06 NOTE — Progress Notes (Signed)
Remote ICD transmission.   

## 2016-05-21 ENCOUNTER — Encounter: Payer: Self-pay | Admitting: Cardiology

## 2016-06-05 ENCOUNTER — Ambulatory Visit (INDEPENDENT_AMBULATORY_CARE_PROVIDER_SITE_OTHER): Payer: Medicare Other | Admitting: *Deleted

## 2016-06-05 DIAGNOSIS — H16223 Keratoconjunctivitis sicca, not specified as Sjogren's, bilateral: Secondary | ICD-10-CM | POA: Diagnosis not present

## 2016-06-05 DIAGNOSIS — I10 Essential (primary) hypertension: Secondary | ICD-10-CM | POA: Diagnosis not present

## 2016-06-05 DIAGNOSIS — Z961 Presence of intraocular lens: Secondary | ICD-10-CM | POA: Diagnosis not present

## 2016-06-05 DIAGNOSIS — Z95810 Presence of automatic (implantable) cardiac defibrillator: Secondary | ICD-10-CM

## 2016-06-05 DIAGNOSIS — I5022 Chronic systolic (congestive) heart failure: Secondary | ICD-10-CM

## 2016-06-05 DIAGNOSIS — I255 Ischemic cardiomyopathy: Secondary | ICD-10-CM

## 2016-06-05 DIAGNOSIS — H40003 Preglaucoma, unspecified, bilateral: Secondary | ICD-10-CM | POA: Diagnosis not present

## 2016-06-05 LAB — CUP PACEART REMOTE DEVICE CHECK
Battery Voltage: 2.62 V
Brady Statistic AP VS Percent: 93.67 %
Brady Statistic RA Percent Paced: 93.61 %
Date Time Interrogation Session: 20180405041607
HighPow Impedance: 44 Ohm
HighPow Impedance: 57 Ohm
Implantable Lead Implant Date: 20100805
Implantable Lead Location: 753859
Implantable Lead Model: 5076
Lead Channel Impedance Value: 475 Ohm
Lead Channel Sensing Intrinsic Amplitude: 10.625 mV
Lead Channel Setting Pacing Amplitude: 2 V
Lead Channel Setting Pacing Amplitude: 2.5 V
Lead Channel Setting Pacing Pulse Width: 0.4 ms
Lead Channel Setting Sensing Sensitivity: 0.3 mV
MDC IDC LEAD IMPLANT DT: 20100805
MDC IDC LEAD LOCATION: 753860
MDC IDC MSMT LEADCHNL RA SENSING INTR AMPL: 1.75 mV
MDC IDC MSMT LEADCHNL RA SENSING INTR AMPL: 1.75 mV
MDC IDC MSMT LEADCHNL RV IMPEDANCE VALUE: 437 Ohm
MDC IDC MSMT LEADCHNL RV SENSING INTR AMPL: 10.625 mV
MDC IDC PG IMPLANT DT: 20100805
MDC IDC STAT BRADY AP VP PERCENT: 0.02 %
MDC IDC STAT BRADY AS VP PERCENT: 0 %
MDC IDC STAT BRADY AS VS PERCENT: 6.31 %
MDC IDC STAT BRADY RV PERCENT PACED: 0.02 %

## 2016-06-05 NOTE — Progress Notes (Signed)
Remote ICD transmission.   

## 2016-06-05 NOTE — Progress Notes (Signed)
EPIC Encounter for ICM Monitoring  Patient Name: Richard Moody is a 83 y.o. male Date: 06/05/2016 Primary Care Physican: MCKEOWN,WILLIAM DAVID, MD Primary Cardiologist:Klein Electrophysiologist: Klein Dry Weight:unknown     Battery Voltage (RRT=2.63V) 2.62 V (05-Jun-2016) Last Full Charge 14.6 sec (08-Apr-2016)         Heart Failure questions reviewed, pt asymptomatic    Thoracic impedance normal.  Prescribed and confirmed dosage: Furosemide 40 mg 1 tablet daily as needed  Labs: 04/23/2016 Creatinine 1.35, BUN 29, Potassium 4.5, Sodium 138, EGFR 47-55 01/17/2016 Creatinine 1.04, BUN 27, Potassium 4.7, Sodium 137, EGFR 65-75  10/15/2015 Creatinine 1.05, BUN 29, Potassium 4.7, Sodium 136, EGFR 64-74  09/19/2015 Creatinine 0.93, BUN 23, Potassium 4.7, Sodium 135, EGFR 75-86  08/31/2015 Creatinine 1.01, BUN 27, Potassium 5.6, Sodium 131, EGFR 68-78  07/25/2015 Creatinine 1.29, BUN 37, Potassium 4.2, Sodium 137  07/24/2015 Creatinine 1.64, BUN 50, Potassium 4.6, Sodium 140  07/18/2015 Creatinine 1.30, BUN 34, Potassium 5.0, Sodium 136, EGFR 50-58  06/01/2015 Creatinine 1.03, BUN 32, Potassium 4.6, Sodium 138  04/09/2015 Creatinine 0.89, BUN 24, Potassium 5.1, Sodium 139, EGFR 78->89   Recommendations: No changes. Encouraged to call for fluid symptoms.  Follow-up plan: ICM clinic phone appointment on 07/07/2016.  Office appointment scheduled on 08/08/2016 with Dr Klein.  Copy of ICM check sent to device physician.   3 month ICM trend: 06/05/2016   1 Year ICM trend:      Laurie S Short, RN 06/05/2016 3:11 PM    

## 2016-06-06 ENCOUNTER — Encounter: Payer: Self-pay | Admitting: Cardiology

## 2016-06-20 ENCOUNTER — Encounter: Payer: Self-pay | Admitting: Cardiology

## 2016-07-07 ENCOUNTER — Ambulatory Visit (INDEPENDENT_AMBULATORY_CARE_PROVIDER_SITE_OTHER): Payer: Medicare Other | Admitting: *Deleted

## 2016-07-07 ENCOUNTER — Telehealth: Payer: Self-pay | Admitting: Cardiology

## 2016-07-07 DIAGNOSIS — I5022 Chronic systolic (congestive) heart failure: Secondary | ICD-10-CM

## 2016-07-07 DIAGNOSIS — Z95810 Presence of automatic (implantable) cardiac defibrillator: Secondary | ICD-10-CM

## 2016-07-07 NOTE — Telephone Encounter (Signed)
Spoke w/ pt and informed him that his device has reached RRT. He has an appt w/ MD on 08-08-16. Pt is aware to keep this appointment and that MD will discuss procedure at this time. Pt verbalized understanding.

## 2016-07-07 NOTE — Progress Notes (Signed)
EPIC Encounter for ICM Monitoring  Patient Name: Richard Moody is a 83 y.o. male Date: 07/07/2016 Primary Care Physican: Unk Pinto, MD Primary [place] Electrophysiologist: Faustino Congress Weight:unknown  Battery Voltage (RRT=2.63V on 07-Jul-2016) 2.61 V RRT (07-Jul-2016)     Heart Failure questions reviewed, pt asymptomatic   Thoracic impedance normal.     Prescribed and confirmed dosage: Furosemide 40 mg 1 tablet daily as needed  Labs: 04/23/2016 Creatinine 1.35, BUN 29, Potassium 4.5, Sodium 138, EGFR 47-55 11/16/2017Creatinine 1.04, BUN 27, Potassium 4.7, Sodium 137, EGFR 65-75  08/14/2017Creatinine 1.05, BUN 29, Potassium 4.7, Sodium 136, EGFR 64-74  07/19/2017Creatinine 0.93, BUN 23, Potassium 4.7, Sodium 135, EGFR 75-86  06/30/2017Creatinine 1.01, BUN 27, Potassium 5.6, Sodium 131, EGFR 68-78  07/25/2015 Creatinine 1.29, BUN 37, Potassium 4.2, Sodium 137  07/24/2015 Creatinine 1.64, BUN 50, Potassium 4.6, Sodium 140  07/18/2015 Creatinine 1.30, BUN 34, Potassium 5.0, Sodium 136, EGFR 50-58  06/01/2015 Creatinine 1.03, BUN 32, Potassium 4.6, Sodium 138  04/09/2015 Creatinine 0.89, BUN 24, Potassium 5.1, Sodium 139, EGFR 78->89   Recommendations: No changes.   Encouraged to call for fluid symptoms or use local ER for any urgent symptoms.  Follow-up plan: ICM clinic phone appointment on 08/18/2016.  Office appointment scheduled on 08/08/2016 with Dr Caryl Comes to discuss battery replacement.  Copy of ICM check sent to device physician.   3 month ICM trend: 07/07/2016   1 Year ICM trend:      Rosalene Billings, RN 07/07/2016 11:36 AM

## 2016-07-08 NOTE — Progress Notes (Signed)
Remote ICD transmission.   

## 2016-07-09 LAB — CUP PACEART REMOTE DEVICE CHECK
Brady Statistic AP VP Percent: 0.07 %
Brady Statistic AP VS Percent: 91.9 %
Brady Statistic AS VS Percent: 8.02 %
Brady Statistic RA Percent Paced: 90.9 %
Brady Statistic RV Percent Paced: 0.09 %
HIGH POWER IMPEDANCE MEASURED VALUE: 42 Ohm
HighPow Impedance: 54 Ohm
Implantable Lead Implant Date: 20100805
Implantable Lead Location: 753860
Implantable Lead Model: 5076
Implantable Lead Model: 6947
Lead Channel Impedance Value: 418 Ohm
Lead Channel Impedance Value: 475 Ohm
Lead Channel Sensing Intrinsic Amplitude: 1.375 mV
Lead Channel Sensing Intrinsic Amplitude: 1.375 mV
Lead Channel Sensing Intrinsic Amplitude: 10.25 mV
Lead Channel Sensing Intrinsic Amplitude: 11.875 mV
Lead Channel Setting Pacing Amplitude: 2 V
MDC IDC LEAD IMPLANT DT: 20100805
MDC IDC LEAD LOCATION: 753859
MDC IDC MSMT BATTERY VOLTAGE: 2.61 V
MDC IDC PG IMPLANT DT: 20100805
MDC IDC SESS DTM: 20180507061511
MDC IDC SET LEADCHNL RV PACING AMPLITUDE: 2.5 V
MDC IDC SET LEADCHNL RV PACING PULSEWIDTH: 0.4 ms
MDC IDC SET LEADCHNL RV SENSING SENSITIVITY: 0.3 mV
MDC IDC STAT BRADY AS VP PERCENT: 0.01 %

## 2016-07-28 NOTE — Progress Notes (Signed)
Patient ID: Richard Moody, male   DOB: December 31, 1927, 83 y.o.   MRN: [id number]  MEDICARE ANNUAL WELLNESS VISIT AND OV  Assessment:    ASCAD s/p CABG (1992) Control blood pressure, cholesterol, glucose, increase exercise.   Chronic systolic CHF (EF 63%) Weight is stable, continue medications, has follow up  Essential hypertension - continue medications, DASH diet, exercise and monitor at home. Call if greater than 130/80.  -     CBC with Differential/Platelet -     BASIC METABOLIC PANEL WITH GFR -     Hepatic function panel -     TSH  Chronic combined systolic and diastolic congestive heart failure (HCC) Weight is stable, continue medications, has follow up  Obstructive chronic bronchitis without exacerbation ([place]) continue meds.   Gastroesophageal reflux disease, esophagitis presence not specified Continue PPI/H2 blocker, diet discussed  Neurodermatitis monitor  OAB (overactive bladder) monitor  Automatic implantable cardioverter-defibrillator in situ Discussed   Prediabetes Discussed general issues about diabetes pathophysiology and management., Educational material distributed., Suggested low cholesterol diet., Encouraged aerobic exercise., Discussed foot care., Reminded to get yearly retinal exam.  Vitamin D deficiency -     VITAMIN D 25 Hydroxy (Vit-D Deficiency, Fractures)  Hyperlipidemia -continue medications, check lipids, decrease fatty foods, increase activity.  -     Lipid panel  Medication management -     Magnesium  Hyperkalemia (Hyporeninemic HypoAldosteronism) monitor -     BASIC METABOLIC PANEL WITH GFR  Fall, sequela  Need for prophylactic vaccination against Streptococcus pneumoniae (pneumococcus) -     Pneumococcal conjugate vaccine 13-valent IM  Anemia, unspecified type -     Iron  Other orders -     traMADol (ULTRAM) 50 MG tablet; Take 1 tablet (50 mg total) by mouth every 6 (six) hours as needed.   Future Appointments Date Time  Provider [place]  08/08/2016 11:45 AM Deboraha Sprang, MD [facility]  08/18/2016 12:00 PM [facility] DEVICE REMOTES [facility]  11/19/2016 2:00 PM Unk Pinto, MD [facility] None     Plan:   During the course of the visit the patient was educated and counseled about appropriate screening and preventive services including:    Pneumococcal vaccine   Influenza vaccine  Td vaccine  Screening electrocardiogram  Bone densitometry screening  Colorectal cancer screening  Diabetes screening  Glaucoma screening  Nutrition counseling   Advanced directives: requested   Subjective:    Richard Moody is a 83 y.o. MWM who presents for Medicare Annual Wellness Visit and OV.  Date of last medicare wellness visit was 2016. This very nice 83 y.o. MWM presents for 3 month follow up with Hypertension, ASCAD Hyperlipidemia, Pre-Diabetes and Vitamin D Deficiency.   Patient is treated for HTN since 2003 & BP has been controlled at home. Today's BP: 100/60. Patient had a MI  And angioplasty in 1989 and then in 1992 underwent CABG. In 2012 he was dx'd with CHF, EF 35-40%, and has a AICD/Defib and battery is depleted, he is on amiodarone 100mg  daily. Patient has had no complaints of any cardiac type chest pain, palpitations, dyspnea/orthopnea/PND, dizziness, claudication, or dependent edema. Follows with Dr. Caryl Comes.  Wt Readings from Last 3 Encounters:  07/30/16 144 lb 12.8 oz (65.7 kg)  04/23/16 142 lb (64.4 kg)  01/31/16 141 lb 3.2 oz (64 kg)   Patient also has moderate COPD with DOE and denies any significant coughing. He has been wearing compression stockings that is helping decrease swelling.    He  is not on cholesterol medication and denies myalgias. His cholesterol is at goal. The cholesterol last visit was:   Lab Results  Component Value Date   CHOL 172 04/23/2016   HDL 65 04/23/2016   LDLCALC 88 04/23/2016   TRIG 97 04/23/2016   CHOLHDL 2.6  04/23/2016    He has been working on diet and exercise for prediabetes, and denies paresthesia of the feet, polydipsia, polyuria and visual disturbances. Last A1C in the office was:  Lab Results  Component Value Date   HGBA1C 5.6 04/23/2016   Patient is on Vitamin D supplement, he is on 10000 daily  Lab Results  Component Value Date   VD25OH 114 (H) 04/23/2016    Body mass index is 20.2 kg/m.  Names of Other Physician/Practitioners you currently use: 1. [facility] here for primary care 2. Dr Talbert Forest, eye doctor, last visit april 2018 3. Dr Baird Cancer, [place], dentist, last visit 04/2015  Patient Care Team: Unk Pinto, MD as PCP - General (Internal Medicine) Unk Pinto, MD (Internal Medicine) Darleen Crocker, MD as Consulting Physician (Ophthalmology) Deboraha Sprang, MD as Consulting Physician (Cardiology)  Medication Review: Current Outpatient Prescriptions on File Prior to Visit  Medication Sig  . acetaminophen (TYLENOL) 500 MG tablet Take 500 mg by mouth as directed.  Marland Kitchen amiodarone (PACERONE) 200 MG tablet Take 0.5 tablets (100 mg total) by mouth daily.  Marland Kitchen aspirin 81 MG tablet Take 81 mg by mouth 3 (three) times a week.    . Cholecalciferol (VITAMIN D3) 5000 UNITS CAPS Take 2 capsules by mouth daily.    . diphenhydrAMINE (BENADRYL) 25 MG tablet Take 25 mg by mouth at bedtime as needed.  . fish oil-omega-3 fatty acids 1000 MG capsule Take 2 g by mouth daily.    Marland Kitchen glucosamine-chondroitin 500-400 MG tablet Take 1 tablet by mouth 2 (two) times daily.    . IRON PO Take 45 mg by mouth daily.   . Magnesium 250 MG TABS Take 1 tablet by mouth 2 (two) times daily.   . metoprolol succinate (TOPROL-XL) 25 MG 24 hr tablet TAKE 1 TABLET DAILY  . Multiple Minerals-Vitamins (CITRACAL PLUS PO) Take 1 capsule by mouth daily.   . polyethylene glycol (MIRALAX / GLYCOLAX) packet Take 17 g by mouth as needed for moderate constipation.   . vitamin C  (ASCORBIC ACID) 500 MG tablet Take 1,000 mg by mouth daily.     No current facility-administered medications on file prior to visit.     Current Problems (verified) Patient Active Problem List   Diagnosis Date Noted  . Chronic combined systolic and diastolic congestive heart failure ([place]) 07/25/2015  . UTI (lower urinary tract infection) 07/24/2015  . Falls 07/24/2015  . Neurodermatitis 11/02/2014  . OAB (overactive bladder) 09/27/2014  . GERD  06/20/2014  . Hyperkalemia (Hyporeninemic HypoAldosteronism) 06/20/2014  . Medication management 12/20/2013  . Hyperlipidemia 09/05/2013  . Essential hypertension 02/27/2013  . Prediabetes 02/27/2013  . Vitamin D deficiency 02/27/2013  . Obstructive chronic bronchitis without exacerbation ([place]) 02/27/2013  . Chronic systolic CHF (EF 26%) 71/24/5809  . Automatic implantable cardioverter-defibrillator in situ 04/08/2010  . ASCAD s/p CABG (1992) 09/29/2008   Screening Tests Immunization History  Administered Date(s) Administered  . Influenza, High Dose Seasonal PF 12/07/2013, 01/04/2015  . Influenza-Unspecified 01/01/2013  . Pneumococcal Conjugate-13 07/30/2016  . Pneumococcal Polysaccharide-23 07/02/2001, 06/01/2013  . Td 10/15/2015  . Tdap 03/04/2003   Preventative care: Last colonoscopy: 2009 declines another  Influenza 2017  Pneumonia 23 2015 TDAP 2005 Prenvar 2018 Shingles vaccine declines  Allergies Allergies  Allergen Reactions  . Lipitor [Atorvastatin]     Elevated cpk   . Mobic [Meloxicam] Other (See Comments)    edema  . Nsaids Other (See Comments)    dyspepsia    SURGICAL HISTORY He  has a past surgical history that includes Cardiac catheterization (10/14/2004); Coronary artery bypass graft (1992); Knee surgery; Cardiac defibrillator placement (10/2008); Inguinal hernia repair; Cardiac catheterization (2006); and NUCLEAR STRESS TEST (2010). FAMILY HISTORY His family history includes Breast cancer in his sister;  Cancer in his mother; Hypertension in his father; Stroke in his father. SOCIAL HISTORY He  reports that he quit smoking about 34 years ago. He has never used smokeless tobacco. He reports that he does not drink alcohol or use drugs.  MEDICARE WELLNESS OBJECTIVES: Physical activity: Current Exercise Habits: The patient does not participate in regular exercise at present Cardiac risk factors: Cardiac Risk Factors include: advanced age (>20men, >39 women);dyslipidemia;hypertension;male gender;sedentary lifestyle Depression/mood screen:   Depression screen [facility] 2/9 07/30/2016  Decreased Interest 0  Down, Depressed, Hopeless 0  PHQ - 2 Score 0    ADLs:  In your present state of health, do you have any difficulty performing the following activities: 07/30/2016 04/23/2016  Hearing? Tempie Donning  Vision? N N  Difficulty concentrating or making decisions? Y N  Walking or climbing stairs? Y N  Dressing or bathing? Y N  Doing errands, shopping? Y -  Conservation officer, nature and eating ? Y -  Using the Toilet? N -  In the past six months, have you accidently leaked urine? Y -  Do you have problems with loss of bowel control? N -  Managing your Medications? Y -  Managing your Finances? Y -  Housekeeping or managing your Housekeeping? Y -  Some recent data might be hidden     Cognitive Testing  Alert? Yes  Normal Appearance?Yes  Oriented to person? Yes  Place? Yes   Time? Yes  Recall of three objects?  2/3  Can perform simple calculations? Yes  Displays appropriate judgment?Yes  Can read the correct time from a watch face?Yes  EOL planning: Does Patient Have a Medical Advance Directive?: Yes Type of Advance Directive: Healthcare Power of Attorney, Living will [place] in Chart?: No - copy requested   Objective:     Blood pressure 100/60, pulse 62, temperature 97.3 F (36.3 C), resp. rate 14, height 5\' 11"  (1.803 m), weight 144 lb 12.8 oz (65.7 kg), SpO2 95 %.  General  Appearance:  Alert  WD/WN, male  in no apparent distress. Eyes: PERRLA, EOMs nl, conjunctiva normal, normal fundi and vessels. Sinuses: No frontal/maxillary tenderness ENT/Mouth: EACs patent / TMs  nl. Nares clear without erythema, swelling, mucoid exudates. Oral hygiene is good. No erythema, swelling, or exudate. Tongue normal, non-obstructing. Tonsils not swollen or erythematous. Hearing normal.  Neck: Supple, thyroid normal. No bruits, nodes or JVD. Respiratory: Respiratory effort normal.  BS equal and decreased  bilateral without rales, rhonci, wheezing or stridor. Cardio: Heart sounds are normal with regular rate and rhythm and no murmurs, rubs or gallops. Peripheral pulses are normal and equal bilaterally without edema. No aortic or femoral bruits. Chest: Barrel configured with median sternotomy scar.  Abdomen: Flat, soft, with nl bowel sounds. Nontender, no guarding, rebound, hernias, masses, or organomegaly.  Lymphatics: Non tender without lymphadenopathy.  Musculoskeletal: Full ROM all peripheral extremities, joint stability, 4/5 strength, and  Antalgic gait with cane.  Skin: Warm and dry without rashes, lesions, cyanosis, clubbing or  ecchymosis.  Neuro: Cranial nerves intact, reflexes equal bilaterally. Normal muscle tone, no cerebellar symptoms.  Pysch: Alert and oriented X 3 with normal affect, insight and judgment appropriate.   Medicare Attestation I have personally reviewed: The patient's medical and social history Their use of alcohol, tobacco or illicit drugs Their current medications and supplements The patient's functional ability including ADLs,fall risks, home safety risks, cognitive, and hearing and visual impairment Diet and physical activities Evidence for depression or mood disorders  The patient's weight, height, BMI, and visual acuity have been recorded in the chart.  I have made referrals, counseling, and provided education to the patient based on review of the  above and I have provided the patient with a written personalized care plan for preventive services.  Over 40 minutes of exam, counseling, chart review was performed.  Vicie Mutters, PA-C   07/30/2016

## 2016-07-30 ENCOUNTER — Ambulatory Visit (INDEPENDENT_AMBULATORY_CARE_PROVIDER_SITE_OTHER): Payer: Medicare Other | Admitting: Physician Assistant

## 2016-07-30 ENCOUNTER — Encounter: Payer: Self-pay | Admitting: Physician Assistant

## 2016-07-30 ENCOUNTER — Ambulatory Visit: Payer: Self-pay | Admitting: Internal Medicine

## 2016-07-30 VITALS — BP 100/60 | HR 62 | Temp 97.3°F | Resp 14 | Ht 71.0 in | Wt 144.8 lb

## 2016-07-30 DIAGNOSIS — Z23 Encounter for immunization: Secondary | ICD-10-CM | POA: Diagnosis not present

## 2016-07-30 DIAGNOSIS — I10 Essential (primary) hypertension: Secondary | ICD-10-CM

## 2016-07-30 DIAGNOSIS — Z95810 Presence of automatic (implantable) cardiac defibrillator: Secondary | ICD-10-CM | POA: Diagnosis not present

## 2016-07-30 DIAGNOSIS — N3281 Overactive bladder: Secondary | ICD-10-CM | POA: Diagnosis not present

## 2016-07-30 DIAGNOSIS — J449 Chronic obstructive pulmonary disease, unspecified: Secondary | ICD-10-CM | POA: Diagnosis not present

## 2016-07-30 DIAGNOSIS — R7303 Prediabetes: Secondary | ICD-10-CM

## 2016-07-30 DIAGNOSIS — E875 Hyperkalemia: Secondary | ICD-10-CM

## 2016-07-30 DIAGNOSIS — D649 Anemia, unspecified: Secondary | ICD-10-CM | POA: Diagnosis not present

## 2016-07-30 DIAGNOSIS — I5042 Chronic combined systolic (congestive) and diastolic (congestive) heart failure: Secondary | ICD-10-CM

## 2016-07-30 DIAGNOSIS — Z79899 Other long term (current) drug therapy: Secondary | ICD-10-CM | POA: Diagnosis not present

## 2016-07-30 DIAGNOSIS — K219 Gastro-esophageal reflux disease without esophagitis: Secondary | ICD-10-CM | POA: Diagnosis not present

## 2016-07-30 DIAGNOSIS — E559 Vitamin D deficiency, unspecified: Secondary | ICD-10-CM | POA: Diagnosis not present

## 2016-07-30 DIAGNOSIS — E782 Mixed hyperlipidemia: Secondary | ICD-10-CM | POA: Diagnosis not present

## 2016-07-30 DIAGNOSIS — W19XXXS Unspecified fall, sequela: Secondary | ICD-10-CM

## 2016-07-30 DIAGNOSIS — I5022 Chronic systolic (congestive) heart failure: Secondary | ICD-10-CM

## 2016-07-30 DIAGNOSIS — I255 Ischemic cardiomyopathy: Secondary | ICD-10-CM

## 2016-07-30 DIAGNOSIS — L280 Lichen simplex chronicus: Secondary | ICD-10-CM

## 2016-07-30 LAB — CBC WITH DIFFERENTIAL/PLATELET
BASOS PCT: 1 %
Basophils Absolute: 64 cells/uL (ref 0–200)
EOS PCT: 3 %
Eosinophils Absolute: 192 cells/uL (ref 15–500)
HEMATOCRIT: 34.9 % — AB (ref 38.5–50.0)
HEMOGLOBIN: 11.7 g/dL — AB (ref 13.2–17.1)
LYMPHS ABS: 1472 {cells}/uL (ref 850–3900)
Lymphocytes Relative: 23 %
MCH: 32.7 pg (ref 27.0–33.0)
MCHC: 33.5 g/dL (ref 32.0–36.0)
MCV: 97.5 fL (ref 80.0–100.0)
MONO ABS: 448 {cells}/uL (ref 200–950)
MPV: 10.4 fL (ref 7.5–12.5)
Monocytes Relative: 7 %
NEUTROS ABS: 4224 {cells}/uL (ref 1500–7800)
Neutrophils Relative %: 66 %
Platelets: 225 10*3/uL (ref 140–400)
RBC: 3.58 MIL/uL — AB (ref 4.20–5.80)
RDW: 13.9 % (ref 11.0–15.0)
WBC: 6.4 10*3/uL (ref 3.8–10.8)

## 2016-07-30 LAB — BASIC METABOLIC PANEL WITH GFR
BUN: 26 mg/dL — ABNORMAL HIGH (ref 7–25)
CO2: 26 mmol/L (ref 20–31)
Calcium: 9.4 mg/dL (ref 8.6–10.3)
Chloride: 103 mmol/L (ref 98–110)
Creat: 1.2 mg/dL — ABNORMAL HIGH (ref 0.70–1.11)
GFR, EST AFRICAN AMERICAN: 63 mL/min (ref >=60)
GFR, EST NON AFRICAN AMERICAN: 54 mL/min — AB (ref >=60)
GLUCOSE: 94 mg/dL (ref 65–99)
Potassium: 4.7 mmol/L (ref 3.5–5.3)
SODIUM: 139 mmol/L (ref 135–146)

## 2016-07-30 LAB — LIPID PANEL
CHOL/HDL RATIO: 2.7 ratio (ref <5.0)
CHOLESTEROL: 185 mg/dL (ref <200)
HDL: 68 mg/dL (ref >40)
LDL CALC: 93 mg/dL (ref <100)
TRIGLYCERIDES: 122 mg/dL (ref <150)
VLDL: 24 mg/dL (ref <30)

## 2016-07-30 LAB — HEPATIC FUNCTION PANEL
ALT: 11 U/L (ref 9–46)
AST: 14 U/L (ref 10–35)
Albumin: 3.7 g/dL (ref 3.6–5.1)
Alkaline Phosphatase: 96 U/L (ref 40–115)
BILIRUBIN DIRECT: 0.1 mg/dL (ref <=0.2)
BILIRUBIN INDIRECT: 0.3 mg/dL (ref 0.2–1.2)
TOTAL PROTEIN: 6.2 g/dL (ref 6.1–8.1)
Total Bilirubin: 0.4 mg/dL (ref 0.2–1.2)

## 2016-07-30 LAB — TSH: TSH: 3.09 mIU/L (ref 0.40–4.50)

## 2016-07-30 LAB — IRON: Iron: 48 ug/dL — ABNORMAL LOW (ref 50–180)

## 2016-07-30 MED ORDER — TRAMADOL HCL 50 MG PO TABS: 50.0000 mg | ORAL_TABLET | Freq: Four times a day (QID) | ORAL | 5 refills | Status: DC | PRN

## 2016-07-30 NOTE — Patient Instructions (Addendum)
[employer] with no obligation # [phone] Do not have to be a member Tues-Sat 10-6  [place]- free test with no obligation # [phone] MUST BE A MEMBER Call for store hours  Have had patient's get good cheaper hearing aids from [employer] The air version has good reviews.   Your ears and sinuses are connected by the eustachian tube. When your sinuses are inflamed, this can close off the tube and cause fluid to collect in your middle ear. This can then cause dizziness, popping, clicking, ringing, and echoing in your ears. This is often NOT an infection and does NOT require antibiotics, it is caused by inflammation so the treatments help the inflammation. This can take a long time to get better so please be patient.  Here are things you can do to help with this: - Try the Flonase or Nasonex. Remember to spray each nostril twice towards the outer part of your eye.  Do not sniff but instead pinch your nose and tilt your head back to help the medicine get into your sinuses.  The best time to do this is at bedtime.Stop if you get blurred vision or nose bleeds.  -While drinking fluids, pinch and hold nose close and swallow, to help open eustachian tubes to drain fluid behind ear drums. -Please pick one of the over the counter allergy medications below and take it once daily for allergies.  It will also help with fluid behind ear drums. Claritin or loratadine cheapest but likely the weakest  Zyrtec or certizine at night because it can make you sleepy The strongest is allegra or fexafinadine  Cheapest at [employer], [employer], [employer] -can use decongestant over the counter, please do not use if you have high blood pressure or certain heart conditions.   if worsening HA, changes vision/speech, imbalance, weakness go to the ER  You can take tylenol (500mg ) or tylenol arthritis (650mg ) with the meloxicam/antiinflammatories. The max you can take of tylenol a day is 3000mg   daily, this is a max of 6 pills a day of the regular tyelnol (500mg ) or a max of 4 a day of the tylenol arthritis (650mg ) as long as no other medications you are taking contain tylenol.

## 2016-07-31 LAB — MAGNESIUM: Magnesium: 2.4 mg/dL (ref 1.5–2.5)

## 2016-07-31 LAB — VITAMIN D 25 HYDROXY (VIT D DEFICIENCY, FRACTURES): Vit D, 25-Hydroxy: 113 ng/mL — ABNORMAL HIGH (ref 30–100)

## 2016-08-08 ENCOUNTER — Ambulatory Visit (INDEPENDENT_AMBULATORY_CARE_PROVIDER_SITE_OTHER): Payer: Medicare Other | Admitting: Internal Medicine

## 2016-08-08 ENCOUNTER — Encounter: Payer: Self-pay | Admitting: Internal Medicine

## 2016-08-08 ENCOUNTER — Encounter: Payer: Self-pay | Admitting: *Deleted

## 2016-08-08 VITALS — BP 118/70 | HR 61 | Ht 71.0 in | Wt 143.0 lb

## 2016-08-08 DIAGNOSIS — I495 Sick sinus syndrome: Secondary | ICD-10-CM | POA: Diagnosis not present

## 2016-08-08 DIAGNOSIS — Z95810 Presence of automatic (implantable) cardiac defibrillator: Secondary | ICD-10-CM

## 2016-08-08 DIAGNOSIS — I493 Ventricular premature depolarization: Secondary | ICD-10-CM | POA: Diagnosis not present

## 2016-08-08 DIAGNOSIS — I5022 Chronic systolic (congestive) heart failure: Secondary | ICD-10-CM

## 2016-08-08 DIAGNOSIS — I255 Ischemic cardiomyopathy: Secondary | ICD-10-CM

## 2016-08-08 DIAGNOSIS — Z79899 Other long term (current) drug therapy: Secondary | ICD-10-CM | POA: Diagnosis not present

## 2016-08-08 DIAGNOSIS — I2589 Other forms of chronic ischemic heart disease: Secondary | ICD-10-CM

## 2016-08-08 DIAGNOSIS — Z01812 Encounter for preprocedural laboratory examination: Secondary | ICD-10-CM | POA: Diagnosis not present

## 2016-08-08 MED ORDER — AMIODARONE HCL 100 MG PO TABS: 100.0000 mg | ORAL_TABLET | Freq: Every day | ORAL | 11 refills | Status: DC

## 2016-08-08 NOTE — Progress Notes (Signed)
Patient Care Team: Unk Pinto, MD as PCP - General (Internal Medicine) Unk Pinto, MD (Internal Medicine) Darleen Crocker, MD as Consulting Physician (Ophthalmology) Deboraha Sprang, MD as Consulting Physician (Cardiology)   HPI  Richard Moody is a 83 y.o. male seen in followup for ICD implanted in 2010.  He has a history of ischemic heart disease with prior bypass surgery depressed left ventricular function with EFof 32%. He has  chronic systolic heart failure but has done relatively well.  Echo 7/16 EF 35-40%  He was noted to have significant PVCs that we were concerned about injury to his cardiomyopathy  Amiodarone was initiated Patient denies symptoms of GI intolerance, sun sensitivity, neurological symptoms attributable to amiodarone.  Surveillance laboratories were in normal limits when checked  Date TSH LFTs Cr  8/17  3.8 13    11  /17  4.26 15    5/18 3.09 11 1.2   He is regaining his strength following his fall. He denies chest pain or shortness of breath.    Past Medical History:  Diagnosis Date  . Ganglion cyst of wrist    LEFT WRIST  . GERD (gastroesophageal reflux disease)   . Hx of CABG 1992  . Hyperlipidemia   . ICD (implantable cardiac defibrillator) in place August 2010  . Ischemic cardiomyopathy    EF 32%  . MI, old    INFERIOR LATERAL  . Normal nuclear stress test June 2010   No ischemia. EF 32%  . OA (osteoarthritis) of knee   . Pre-diabetes   . S/P cardiac cath 2006   Occluded SVG to OM. Managed medically  . Vitamin D deficiency     Past Surgical History:  Procedure Laterality Date  . CARDIAC CATHETERIZATION  10/14/2004   THERE IS POSTERIOR LATERAL HYPOKINESIA. EF 30-35%  . CARDIAC CATHETERIZATION  2006   OCCLUDED SVG TO OM  . CARDIAC DEFIBRILLATOR PLACEMENT  10/2008  . CORONARY ARTERY BYPASS GRAFT  1992  . INGUINAL HERNIA REPAIR    . KNEE SURGERY     RIGHT KNEE  . NUCLEAR STRESS TEST  2010   EF 32%. No ischemia     Current Outpatient Prescriptions  Medication Sig Dispense Refill  . acetaminophen (TYLENOL) 500 MG tablet Take 500 mg by mouth as directed.    Marland Kitchen amiodarone (PACERONE) 200 MG tablet Take 0.5 tablets (100 mg total) by mouth daily. 90 tablet 3  . aspirin 81 MG tablet Take 81 mg by mouth 3 (three) times a week.      . Cholecalciferol (VITAMIN D3) 5000 UNITS CAPS Take 2 capsules by mouth daily.      . diphenhydrAMINE (BENADRYL) 25 MG tablet Take 25 mg by mouth at bedtime as needed.    . fish oil-omega-3 fatty acids 1000 MG capsule Take 2 g by mouth daily.      Marland Kitchen glucosamine-chondroitin 500-400 MG tablet Take 1 tablet by mouth 2 (two) times daily.      . IRON PO Take 45 mg by mouth daily.     . Magnesium 250 MG TABS Take 1 tablet by mouth 2 (two) times daily.     . metoprolol succinate (TOPROL-XL) 25 MG 24 hr tablet Take 12.5 mg by mouth daily.    . Multiple Minerals-Vitamins (CITRACAL PLUS PO) Take 1 capsule by mouth daily.     . polyethylene glycol (MIRALAX / GLYCOLAX) packet Take 17 g by mouth as needed for moderate constipation.     Marland Kitchen  traMADol (ULTRAM) 50 MG tablet Take 1 tablet (50 mg total) by mouth every 6 (six) hours as needed. 90 tablet 5  . vitamin C (ASCORBIC ACID) 500 MG tablet Take 1,000 mg by mouth daily.       No current facility-administered medications for this visit.     Allergies  Allergen Reactions  . Lipitor [Atorvastatin]     Elevated cpk   . Mobic [Meloxicam] Other (See Comments)    edema  . Nsaids Other (See Comments)    dyspepsia    Review of Systems negative except from HPI and PMH  Physical Exam BP 118/70   Pulse 61   Ht 5\' 11"  (1.803 m)   Wt 143 lb (64.9 kg)   SpO2 95%   BMI 19.94 kg/m  Well developed and nourished in no acute distress HENT normal Neck supple with JVP-flat Carotids brisk and full without bruits Clear Regular rate and rhythm, no murmurs or gallops Abd-soft with active BS without hepatomegaly No Clubbing cyanosis  edema Skin-warm and dry A & Oriented  Grossly normal sensory and motor function walking with a walker  ECG Apacing @62  24/10/48    Assessment and  Plan  Ischemic cardiomyopathy    Congestive heart failure-chronic-systolic     Implantable  defibrillator-[employer]  The patient's device was interrogated.  The information was reviewed. No changes were made in the programming.    PVCs  The patient has decided to downgraded from an ICD to a pacemaker   age and no interval therapies since implant I think this is reasonable.   Without symptoms of ischemia  Euvolemic continue current meds  We have reviewed the benefits and risks of generator replacement.  These include but are not limited to lead fracture and infection.  The patient understands, agrees and is willing to proceed.     We will continue the amiodarone at low dose for PVC suppression.

## 2016-08-08 NOTE — Patient Instructions (Addendum)
Medication Instructions: - Your physician recommends that you continue on your current medications as directed. Please refer to the Current Medication list given to you today.  Labwork: - Your physician recommends that you return for lab work : 08/20/16 (BMP/CBC/INR)  Procedures/Testing: - Your physician has recommended that you have your defibrillator downgraded to a pacemaker when your battery is changed out.   Follow-Up: - Your physician recommends that you schedule a follow-up appointment in: about 14 days from 08/25/16 with the [facility] for a wound check appointment.   Any Additional Special Instructions Will Be Listed Below (If Applicable).     If you need a refill on your cardiac medications before your next appointment, please call your pharmacy.

## 2016-08-08 NOTE — Addendum Note (Signed)
Addended byAlvis Lemmings on: 08/08/2016 04:37 PM   Modules accepted: Orders

## 2016-08-12 ENCOUNTER — Encounter: Payer: Self-pay | Admitting: Physician Assistant

## 2016-08-15 LAB — CUP PACEART INCLINIC DEVICE CHECK
Battery Voltage: 2.61 V
Brady Statistic AP VP Percent: 0.03 %
Brady Statistic AP VS Percent: 94.56 %
Brady Statistic AS VP Percent: 0 %
Brady Statistic AS VS Percent: 5.41 %
Date Time Interrogation Session: 20180608161004
HIGH POWER IMPEDANCE MEASURED VALUE: 42 Ohm
HIGH POWER IMPEDANCE MEASURED VALUE: 55 Ohm
Implantable Lead Implant Date: 20100805
Implantable Lead Location: 753860
Implantable Pulse Generator Implant Date: 20100805
Lead Channel Impedance Value: 418 Ohm
Lead Channel Pacing Threshold Amplitude: 1 V
Lead Channel Pacing Threshold Pulse Width: 0.4 ms
Lead Channel Sensing Intrinsic Amplitude: 11.875 mV
Lead Channel Sensing Intrinsic Amplitude: 2.625 mV
Lead Channel Setting Pacing Amplitude: 2 V
Lead Channel Setting Pacing Pulse Width: 0.4 ms
Lead Channel Setting Sensing Sensitivity: 0.3 mV
MDC IDC LEAD IMPLANT DT: 20100805
MDC IDC LEAD LOCATION: 753859
MDC IDC MSMT LEADCHNL RA IMPEDANCE VALUE: 475 Ohm
MDC IDC MSMT LEADCHNL RA PACING THRESHOLD AMPLITUDE: 0.75 V
MDC IDC MSMT LEADCHNL RA PACING THRESHOLD PULSEWIDTH: 0.4 ms
MDC IDC SET LEADCHNL RV PACING AMPLITUDE: 2.5 V
MDC IDC STAT BRADY RA PERCENT PACED: 94.14 %
MDC IDC STAT BRADY RV PERCENT PACED: 0.04 %

## 2016-08-18 ENCOUNTER — Ambulatory Visit (INDEPENDENT_AMBULATORY_CARE_PROVIDER_SITE_OTHER): Payer: Medicare Other

## 2016-08-18 DIAGNOSIS — I5022 Chronic systolic (congestive) heart failure: Secondary | ICD-10-CM

## 2016-08-18 DIAGNOSIS — Z95810 Presence of automatic (implantable) cardiac defibrillator: Secondary | ICD-10-CM | POA: Diagnosis not present

## 2016-08-18 NOTE — Progress Notes (Signed)
EPIC Encounter for ICM Monitoring  Patient Name: Richard Moody is a 83 y.o. male Date: 08/18/2016 Primary Care Physican: Unk Pinto, MD [facility] Electrophysiologist: Faustino Congress Weight:unknown      Heart Failure questions reviewed, pt asymptomatic.  Patient will have procedure 08/25/2016 to replace battery and will be downgraded from ICD to a pacemaker      Thoracic impedance slightly abnormal suggesting fluid accumulation for approximately 2 weeks.  No diuretic  Labs: 07/30/2016 Creatinine 1.20, BUN 26, Potassium 4.7, Sodium 139, EGFR 54-63 04/23/2016 Creatinine 1.35, BUN 29, Potassium 4.5, Sodium 138, EGFR 47-55 11/16/2017Creatinine 1.04, BUN 27, Potassium 4.7, Sodium 137, EGFR 65-75  08/14/2017Creatinine 1.05, BUN 29, Potassium 4.7, Sodium 136, EGFR 64-74  07/19/2017Creatinine 0.93, BUN 23, Potassium 4.7, Sodium 135, EGFR 75-86  06/30/2017Creatinine 1.01, BUN 27, Potassium 5.6, Sodium 131, EGFR 68-78  07/25/2015 Creatinine 1.29, BUN 37, Potassium 4.2, Sodium 137  07/24/2015 Creatinine 1.64, BUN 50, Potassium 4.6, Sodium 140  07/18/2015 Creatinine 1.30, BUN 34, Potassium 5.0, Sodium 136, EGFR 50-58  06/01/2015 Creatinine 1.03, BUN 32, Potassium 4.6, Sodium 138  04/09/2015 Creatinine 0.89, BUN 24, Potassium 5.1, Sodium 139, EGFR 78->89    Recommendations: No changes.  Advised to limit salt intake to 2000 mg/day and fluid intake to < 2 liters/day.  Encouraged to call for fluid symptoms.  Follow-up plan: ICM clinic phone appointment on 09/30/2016 due to battery change  Copy of ICM check sent to device physician.   3 month ICM trend: 08/18/2016   1 Year ICM trend:      Rosalene Billings, RN 08/18/2016 4:58 PM

## 2016-08-20 ENCOUNTER — Other Ambulatory Visit: Payer: Medicare Other | Admitting: *Deleted

## 2016-08-20 DIAGNOSIS — I255 Ischemic cardiomyopathy: Secondary | ICD-10-CM

## 2016-08-20 DIAGNOSIS — Z01812 Encounter for preprocedural laboratory examination: Secondary | ICD-10-CM

## 2016-08-20 DIAGNOSIS — I5022 Chronic systolic (congestive) heart failure: Secondary | ICD-10-CM | POA: Diagnosis not present

## 2016-08-21 LAB — BASIC METABOLIC PANEL
BUN / CREAT RATIO: 26 — AB (ref 10–24)
BUN: 27 mg/dL (ref 8–27)
CO2: 24 mmol/L (ref 20–29)
CREATININE: 1.04 mg/dL (ref 0.76–1.27)
Calcium: 9.5 mg/dL (ref 8.6–10.2)
Chloride: 100 mmol/L (ref 96–106)
GFR calc non Af Amer: 65 mL/min/{1.73_m2} (ref >59)
GFR, EST AFRICAN AMERICAN: 75 mL/min/{1.73_m2} (ref >59)
GLUCOSE: 95 mg/dL (ref 65–99)
Potassium: 4.9 mmol/L (ref 3.5–5.2)
Sodium: 139 mmol/L (ref 134–144)

## 2016-08-21 LAB — CBC WITH DIFFERENTIAL/PLATELET
Basophils Absolute: 0 10*3/uL (ref 0.0–0.2)
Basos: 1 %
EOS (ABSOLUTE): 0.2 10*3/uL (ref 0.0–0.4)
EOS: 4 %
HEMATOCRIT: 33.8 % — AB (ref 37.5–51.0)
HEMOGLOBIN: 11.5 g/dL — AB (ref 13.0–17.7)
IMMATURE GRANS (ABS): 0 10*3/uL (ref 0.0–0.1)
IMMATURE GRANULOCYTES: 0 %
Lymphocytes Absolute: 1.3 10*3/uL (ref 0.7–3.1)
Lymphs: 21 %
MCH: 32 pg (ref 26.6–33.0)
MCHC: 34 g/dL (ref 31.5–35.7)
MCV: 94 fL (ref 79–97)
MONOCYTES: 11 %
Monocytes Absolute: 0.7 10*3/uL (ref 0.1–0.9)
Neutrophils Absolute: 3.7 10*3/uL (ref 1.4–7.0)
Neutrophils: 63 %
Platelets: 225 10*3/uL (ref 150–379)
RBC: 3.59 x10E6/uL — ABNORMAL LOW (ref 4.14–5.80)
RDW: 14.2 % (ref 12.3–15.4)
WBC: 5.9 10*3/uL (ref 3.4–10.8)

## 2016-08-21 LAB — PROTIME-INR
INR: 1 (ref 0.8–1.2)
Prothrombin Time: 10.3 s (ref 9.1–12.0)

## 2016-08-25 ENCOUNTER — Encounter (HOSPITAL_COMMUNITY): Payer: Self-pay | Admitting: Internal Medicine

## 2016-08-25 ENCOUNTER — Ambulatory Visit (HOSPITAL_COMMUNITY)
Admission: RE | Admit: 2016-08-25 | Discharge: 2016-08-25 | Disposition: A | Discharge status: Home / Self Care | Payer: Medicare Other | Source: Ambulatory Visit | Attending: Internal Medicine | Admitting: Internal Medicine

## 2016-08-25 ENCOUNTER — Ambulatory Visit (HOSPITAL_COMMUNITY)
Admission: RE | Disposition: A | Discharge status: Home / Self Care | Payer: Self-pay | Source: Ambulatory Visit | Attending: Internal Medicine

## 2016-08-25 DIAGNOSIS — I495 Sick sinus syndrome: Secondary | ICD-10-CM | POA: Diagnosis not present

## 2016-08-25 DIAGNOSIS — I255 Ischemic cardiomyopathy: Secondary | ICD-10-CM | POA: Insufficient documentation

## 2016-08-25 DIAGNOSIS — Z95810 Presence of automatic (implantable) cardiac defibrillator: Secondary | ICD-10-CM | POA: Diagnosis present

## 2016-08-25 DIAGNOSIS — M1710 Unilateral primary osteoarthritis, unspecified knee: Secondary | ICD-10-CM | POA: Diagnosis not present

## 2016-08-25 DIAGNOSIS — Z79899 Other long term (current) drug therapy: Secondary | ICD-10-CM | POA: Diagnosis not present

## 2016-08-25 DIAGNOSIS — Z888 Allergy status to other drugs, medicaments and biological substances status: Secondary | ICD-10-CM | POA: Diagnosis not present

## 2016-08-25 DIAGNOSIS — I5022 Chronic systolic (congestive) heart failure: Secondary | ICD-10-CM | POA: Diagnosis not present

## 2016-08-25 DIAGNOSIS — Z951 Presence of aortocoronary bypass graft: Secondary | ICD-10-CM | POA: Insufficient documentation

## 2016-08-25 DIAGNOSIS — Z4502 Encounter for adjustment and management of automatic implantable cardiac defibrillator: Secondary | ICD-10-CM | POA: Insufficient documentation

## 2016-08-25 DIAGNOSIS — K219 Gastro-esophageal reflux disease without esophagitis: Secondary | ICD-10-CM | POA: Insufficient documentation

## 2016-08-25 DIAGNOSIS — Z45010 Encounter for checking and testing of cardiac pacemaker pulse generator [battery]: Secondary | ICD-10-CM | POA: Diagnosis not present

## 2016-08-25 DIAGNOSIS — Z7982 Long term (current) use of aspirin: Secondary | ICD-10-CM | POA: Diagnosis not present

## 2016-08-25 DIAGNOSIS — E785 Hyperlipidemia, unspecified: Secondary | ICD-10-CM | POA: Diagnosis not present

## 2016-08-25 DIAGNOSIS — I252 Old myocardial infarction: Secondary | ICD-10-CM | POA: Insufficient documentation

## 2016-08-25 DIAGNOSIS — I493 Ventricular premature depolarization: Secondary | ICD-10-CM | POA: Insufficient documentation

## 2016-08-25 DIAGNOSIS — R7303 Prediabetes: Secondary | ICD-10-CM | POA: Insufficient documentation

## 2016-08-25 DIAGNOSIS — E559 Vitamin D deficiency, unspecified: Secondary | ICD-10-CM | POA: Insufficient documentation

## 2016-08-25 HISTORY — PX: PPM GENERATOR CHANGEOUT: EP1233

## 2016-08-25 LAB — SURGICAL PCR SCREEN
MRSA, PCR: NEGATIVE
STAPHYLOCOCCUS AUREUS: NEGATIVE

## 2016-08-25 SURGERY — PPM GENERATOR CHANGEOUT

## 2016-08-25 MED ORDER — HEPARIN (PORCINE) IN NACL 2-0.9 UNIT/ML-% IJ SOLN
INTRAMUSCULAR | Status: AC
  Filled 2016-08-25: qty 500

## 2016-08-25 MED ORDER — MUPIROCIN 2 % EX OINT
TOPICAL_OINTMENT | CUTANEOUS | Status: AC
  Administered 2016-08-25: 1
  Filled 2016-08-25: qty 22

## 2016-08-25 MED ORDER — LIDOCAINE HCL (PF) 1 % IJ SOLN
INTRAMUSCULAR | Status: DC | PRN
  Administered 2016-08-25: 45 mL

## 2016-08-25 MED ORDER — LIDOCAINE HCL (PF) 1 % IJ SOLN
INTRAMUSCULAR | Status: AC
  Filled 2016-08-25: qty 60

## 2016-08-25 MED ORDER — SODIUM CHLORIDE 0.9 % IR SOLN
80.0000 mg | Status: AC
  Administered 2016-08-25: 80 mg

## 2016-08-25 MED ORDER — SODIUM CHLORIDE 0.9 % IV SOLN
INTRAVENOUS | Status: DC
  Administered 2016-08-25: 10:00:00 via INTRAVENOUS

## 2016-08-25 MED ORDER — CEFAZOLIN SODIUM-DEXTROSE 2-4 GM/100ML-% IV SOLN
INTRAVENOUS | Status: AC
  Filled 2016-08-25: qty 100

## 2016-08-25 MED ORDER — SODIUM CHLORIDE 0.9 % IR SOLN
Status: AC
  Filled 2016-08-25: qty 2

## 2016-08-25 MED ORDER — CEFAZOLIN SODIUM-DEXTROSE 2-4 GM/100ML-% IV SOLN
2.0000 g | INTRAVENOUS | Status: AC
  Administered 2016-08-25: 2 g via INTRAVENOUS

## 2016-08-25 MED ORDER — CHLORHEXIDINE GLUCONATE 4 % EX LIQD: 60.0000 mL | Freq: Once | CUTANEOUS | Status: DC

## 2016-08-25 SURGICAL SUPPLY — 6 items
CABLE SURGICAL S-101-97-12 (CABLE) ×2 IMPLANT
HEMOSTAT SURGICEL 2X4 FIBR (HEMOSTASIS) ×2 IMPLANT
IPG PACE AZUR XT DR MRI W1DR01 (Pacemaker) IMPLANT
PACE AZURE XT DR MRI W1DR01 (Pacemaker) ×3 IMPLANT
PAD DEFIB LIFELINK (PAD) ×2 IMPLANT
TRAY PACEMAKER INSERTION (PACKS) ×3 IMPLANT

## 2016-08-25 NOTE — Interval H&P Note (Signed)
History and Physical Interval Note:  08/25/2016 9:53 AM  Richard Moody  has presented today for surgery, with the diagnosis of ischemic cardiomyopathy, chf  The various methods of treatment have been discussed with the patient and family. After consideration of risks, benefits and other options for treatment, the patient has consented to  Procedure(s): ICD Revision (N/A) as a surgical intervention .  The patient's history has been reviewed, patient examined, no change in status, stable for surgery.  I have reviewed the patient's chart and labs.  Questions were answered to the patient's satisfaction.     Virl Axe  For downgrade We have reviewed the benefits and risks of generator replacement.  These include but are not limited to lead fracture and infection.  The patient understands, agrees and is willing to proceed.

## 2016-08-25 NOTE — Interval H&P Note (Signed)
ICD Criteria  Current LVEF:35%. Within 12 months prior to implant: No   Heart failure history: Yes, Class II  Cardiomyopathy history: Yes, Ischemic Cardiomyopathy - Prior MI.  Atrial Fibrillation/Atrial Flutter: No.  Ventricular tachycardia history: No.  Cardiac arrest history: No.  History of syndromes with risk of sudden death: No.  Previous ICD: Yes, Reason for ICD:  Primary prevention.  Current ICD indication: none being removed   PPM indication: Yes. Pacing type: Atrial. Greater than 40% RV pacing requirement anticipated. Indication: Sick Sinus Syndrome   Class I or II Bradycardia indication present: Yes  Beta Blocker therapy for 3 or more months: Yes, prescribed.   Ace Inhibitor/ARB therapy for 3 or more months: No, medical reason.  History and Physical Interval Note:  08/25/2016 9:54 AM  [name]  has presented today for surgery, with the diagnosis of ischemic cardiomyopathy, chf  The various methods of treatment have been discussed with the patient and family. After consideration of risks, benefits and other options for treatment, the patient has consented to  Procedure(s): ICD Revision (N/A) as a surgical intervention .  The patient's history has been reviewed, patient examined, no change in status, stable for surgery.  I have reviewed the patient's chart and labs.  Questions were answered to the patient's satisfaction.     Richard Moody

## 2016-08-25 NOTE — Discharge Instructions (Signed)

## 2016-08-25 NOTE — H&P (View-Only) (Signed)
Patient Care Team: Unk Pinto, MD as PCP - General (Internal Medicine) Unk Pinto, MD (Internal Medicine) Darleen Crocker, MD as Consulting Physician (Ophthalmology) Deboraha Sprang, MD as Consulting Physician (Cardiology)   HPI  Richard Moody is a 83 y.o. male seen in followup for ICD implanted in 2010.  He has a history of ischemic heart disease with prior bypass surgery depressed left ventricular function with EFof 32%. He has  chronic systolic heart failure but has done relatively well.  Echo 7/16 EF 35-40%  He was noted to have significant PVCs that we were concerned about injury to his cardiomyopathy  Amiodarone was initiated Patient denies symptoms of GI intolerance, sun sensitivity, neurological symptoms attributable to amiodarone.  Surveillance laboratories were in normal limits when checked  Date TSH LFTs Cr  8/17  3.8 13    11  /17  4.26 15    5/18 3.09 11 1.2   He is regaining his strength following his fall. He denies chest pain or shortness of breath.    Past Medical History:  Diagnosis Date  . Ganglion cyst of wrist    LEFT WRIST  . GERD (gastroesophageal reflux disease)   . Hx of CABG 1992  . Hyperlipidemia   . ICD (implantable cardiac defibrillator) in place August 2010  . Ischemic cardiomyopathy    EF 32%  . MI, old    INFERIOR LATERAL  . Normal nuclear stress test June 2010   No ischemia. EF 32%  . OA (osteoarthritis) of knee   . Pre-diabetes   . S/P cardiac cath 2006   Occluded SVG to OM. Managed medically  . Vitamin D deficiency     Past Surgical History:  Procedure Laterality Date  . CARDIAC CATHETERIZATION  10/14/2004   THERE IS POSTERIOR LATERAL HYPOKINESIA. EF 30-35%  . CARDIAC CATHETERIZATION  2006   OCCLUDED SVG TO OM  . CARDIAC DEFIBRILLATOR PLACEMENT  10/2008  . CORONARY ARTERY BYPASS GRAFT  1992  . INGUINAL HERNIA REPAIR    . KNEE SURGERY     RIGHT KNEE  . NUCLEAR STRESS TEST  2010   EF 32%. No ischemia     Current Outpatient Prescriptions  Medication Sig Dispense Refill  . acetaminophen (TYLENOL) 500 MG tablet Take 500 mg by mouth as directed.    Marland Kitchen amiodarone (PACERONE) 200 MG tablet Take 0.5 tablets (100 mg total) by mouth daily. 90 tablet 3  . aspirin 81 MG tablet Take 81 mg by mouth 3 (three) times a week.      . Cholecalciferol (VITAMIN D3) 5000 UNITS CAPS Take 2 capsules by mouth daily.      . diphenhydrAMINE (BENADRYL) 25 MG tablet Take 25 mg by mouth at bedtime as needed.    . fish oil-omega-3 fatty acids 1000 MG capsule Take 2 g by mouth daily.      Marland Kitchen glucosamine-chondroitin 500-400 MG tablet Take 1 tablet by mouth 2 (two) times daily.      . IRON PO Take 45 mg by mouth daily.     . Magnesium 250 MG TABS Take 1 tablet by mouth 2 (two) times daily.     . metoprolol succinate (TOPROL-XL) 25 MG 24 hr tablet Take 12.5 mg by mouth daily.    . Multiple Minerals-Vitamins (CITRACAL PLUS PO) Take 1 capsule by mouth daily.     . polyethylene glycol (MIRALAX / GLYCOLAX) packet Take 17 g by mouth as needed for moderate constipation.     Marland Kitchen  traMADol (ULTRAM) 50 MG tablet Take 1 tablet (50 mg total) by mouth every 6 (six) hours as needed. 90 tablet 5  . vitamin C (ASCORBIC ACID) 500 MG tablet Take 1,000 mg by mouth daily.       No current facility-administered medications for this visit.     Allergies  Allergen Reactions  . Lipitor [Atorvastatin]     Elevated cpk   . Mobic [Meloxicam] Other (See Comments)    edema  . Nsaids Other (See Comments)    dyspepsia    Review of Systems negative except from HPI and PMH  Physical Exam BP 118/70   Pulse 61   Ht 5\' 11"  (1.803 m)   Wt 143 lb (64.9 kg)   SpO2 95%   BMI 19.94 kg/m  Well developed and nourished in no acute distress HENT normal Neck supple with JVP-flat Carotids brisk and full without bruits Clear Regular rate and rhythm, no murmurs or gallops Abd-soft with active BS without hepatomegaly No Clubbing cyanosis  edema Skin-warm and dry A & Oriented  Grossly normal sensory and motor function walking with a walker  ECG Apacing @62  24/10/48    Assessment and  Plan  Ischemic cardiomyopathy    Congestive heart failure-chronic-systolic     Implantable  defibrillator-[employer]  The patient's device was interrogated.  The information was reviewed. No changes were made in the programming.    PVCs  The patient has decided to downgraded from an ICD to a pacemaker   age and no interval therapies since implant I think this is reasonable.   Without symptoms of ischemia  Euvolemic continue current meds  We have reviewed the benefits and risks of generator replacement.  These include but are not limited to lead fracture and infection.  The patient understands, agrees and is willing to proceed.     We will continue the amiodarone at low dose for PVC suppression.

## 2016-08-26 MED FILL — Heparin Sodium (Porcine) 2 Unit/ML in Sodium Chloride 0.9%: INTRAMUSCULAR | Qty: 500 | Status: AC

## 2016-09-04 ENCOUNTER — Ambulatory Visit (INDEPENDENT_AMBULATORY_CARE_PROVIDER_SITE_OTHER): Payer: Medicare Other | Admitting: *Deleted

## 2016-09-04 DIAGNOSIS — I495 Sick sinus syndrome: Secondary | ICD-10-CM | POA: Diagnosis not present

## 2016-09-04 DIAGNOSIS — I493 Ventricular premature depolarization: Secondary | ICD-10-CM | POA: Diagnosis not present

## 2016-09-04 LAB — CUP PACEART INCLINIC DEVICE CHECK
Battery Voltage: 3.22 V
Brady Statistic AS VP Percent: 0.02 %
Brady Statistic RA Percent Paced: 92.53 %
Brady Statistic RV Percent Paced: 0.18 %
Implantable Lead Implant Date: 20100805
Implantable Lead Location: 753860
Implantable Lead Model: 6947
Implantable Pulse Generator Implant Date: 20180625
Lead Channel Impedance Value: 304 Ohm
Lead Channel Impedance Value: 361 Ohm
Lead Channel Impedance Value: 437 Ohm
Lead Channel Pacing Threshold Amplitude: 0.75 V
Lead Channel Pacing Threshold Pulse Width: 0.4 ms
Lead Channel Pacing Threshold Pulse Width: 0.4 ms
Lead Channel Sensing Intrinsic Amplitude: 2.125 mV
Lead Channel Setting Pacing Amplitude: 1.5 V
Lead Channel Setting Sensing Sensitivity: 2.8 mV
MDC IDC LEAD IMPLANT DT: 20100805
MDC IDC LEAD LOCATION: 753859
MDC IDC MSMT BATTERY REMAINING LONGEVITY: 165 mo
MDC IDC MSMT LEADCHNL RA SENSING INTR AMPL: 1.875 mV
MDC IDC MSMT LEADCHNL RV IMPEDANCE VALUE: 323 Ohm
MDC IDC MSMT LEADCHNL RV PACING THRESHOLD AMPLITUDE: 1 V
MDC IDC MSMT LEADCHNL RV SENSING INTR AMPL: 10.375 mV
MDC IDC MSMT LEADCHNL RV SENSING INTR AMPL: 13 mV
MDC IDC SESS DTM: 20180705153952
MDC IDC SET LEADCHNL RV PACING AMPLITUDE: 2 V
MDC IDC SET LEADCHNL RV PACING PULSEWIDTH: 0.4 ms
MDC IDC STAT BRADY AP VP PERCENT: 0.16 %
MDC IDC STAT BRADY AP VS PERCENT: 92.32 %
MDC IDC STAT BRADY AS VS PERCENT: 7.51 %

## 2016-09-04 NOTE — Progress Notes (Signed)
Wound check appointment s/p downgrade from ICD 08/25/16. Steri-strips removed. Wound without redness or edema. Incision edges approximated, wound well healed. Normal device function. Thresholds, sensing, and impedances consistent with implant measurements. Histogram distribution appropriate for patient and level of activity. No mode switches or high ventricular rates noted. Patient educated about wound care and [facility] monitoring. Dannial Monarch to reach out to patient/wife for monitor SN- monitor not set up in [place]. ROV with SK 11/27/16.

## 2016-10-16 NOTE — Progress Notes (Signed)
No ICM remote transmission received for 10/13/2016 and next ICM transmission scheduled for 11/13/2016.

## 2016-11-11 ENCOUNTER — Ambulatory Visit (INDEPENDENT_AMBULATORY_CARE_PROVIDER_SITE_OTHER): Payer: Medicare Other

## 2016-11-11 ENCOUNTER — Telehealth: Payer: Self-pay | Admitting: Cardiology

## 2016-11-11 DIAGNOSIS — Z95810 Presence of automatic (implantable) cardiac defibrillator: Secondary | ICD-10-CM

## 2016-11-11 DIAGNOSIS — I5022 Chronic systolic (congestive) heart failure: Secondary | ICD-10-CM

## 2016-11-11 NOTE — Progress Notes (Signed)
No further ICM follow up.  Patient does not have cardiac compass with HF diagnostics for review since change to pacemaker.

## 2016-11-11 NOTE — Telephone Encounter (Signed)
Spoke with pt and reminded pt of remote transmission that is due today. Pt verbalized understanding.   

## 2016-11-14 ENCOUNTER — Encounter: Payer: Self-pay | Admitting: Internal Medicine

## 2016-11-14 NOTE — Progress Notes (Signed)
Call to patient.  Advised his new pacemaker is not showing any heart failure fluid levels on the report.  Advised would discuss with Dr Caryl Comes next week to see if his device has the capability to recording fluid levels and will call him back.  He said that would be fine.  He said he is feeling good at this time.

## 2016-11-19 ENCOUNTER — Ambulatory Visit (INDEPENDENT_AMBULATORY_CARE_PROVIDER_SITE_OTHER): Payer: Medicare Other | Admitting: Internal Medicine

## 2016-11-19 ENCOUNTER — Encounter: Payer: Self-pay | Admitting: Internal Medicine

## 2016-11-19 VITALS — BP 132/80 | HR 76 | Temp 97.8°F | Resp 18 | Ht 70.5 in | Wt 141.4 lb

## 2016-11-19 DIAGNOSIS — I255 Ischemic cardiomyopathy: Secondary | ICD-10-CM

## 2016-11-19 DIAGNOSIS — K219 Gastro-esophageal reflux disease without esophagitis: Secondary | ICD-10-CM

## 2016-11-19 DIAGNOSIS — R7309 Other abnormal glucose: Secondary | ICD-10-CM | POA: Diagnosis not present

## 2016-11-19 DIAGNOSIS — Z125 Encounter for screening for malignant neoplasm of prostate: Secondary | ICD-10-CM

## 2016-11-19 DIAGNOSIS — I10 Essential (primary) hypertension: Secondary | ICD-10-CM | POA: Diagnosis not present

## 2016-11-19 DIAGNOSIS — R7303 Prediabetes: Secondary | ICD-10-CM

## 2016-11-19 DIAGNOSIS — N320 Bladder-neck obstruction: Secondary | ICD-10-CM | POA: Diagnosis not present

## 2016-11-19 DIAGNOSIS — E559 Vitamin D deficiency, unspecified: Secondary | ICD-10-CM | POA: Diagnosis not present

## 2016-11-19 DIAGNOSIS — Z136 Encounter for screening for cardiovascular disorders: Secondary | ICD-10-CM | POA: Diagnosis not present

## 2016-11-19 DIAGNOSIS — Z79899 Other long term (current) drug therapy: Secondary | ICD-10-CM

## 2016-11-19 DIAGNOSIS — Z1211 Encounter for screening for malignant neoplasm of colon: Secondary | ICD-10-CM

## 2016-11-19 DIAGNOSIS — E782 Mixed hyperlipidemia: Secondary | ICD-10-CM

## 2016-11-19 NOTE — Patient Instructions (Signed)

## 2016-11-19 NOTE — Progress Notes (Signed)
[facility]   Unk Pinto, M.D.     [name]. Silverio Lay, P.A.-C Liane Comber, [place]                [address] [place], [place] [address] Telephone [phone] Telefax [phone]  Comprehensive Evaluation & Examination     This very nice 83 y.o. MWM presents for a  comprehensive evaluation and management of multiple medical co-morbidities.  Patient has been followed for HTN, Prediabetes, Hyperlipidemia and Vitamin D Deficiency.     HTN predates since 2003 and hx/o ASHD from CABG in 1992. In 2010 he had a PM/AICD implanted by Dr Caryl Comes and recently had a change out  In June 2018 with a PPM since he had no discharges.  Today's BP is at goal - 132/80. Patient denies any cardiac symptoms as chest pain, palpitations, shortness of breath, dizziness or ankle swelling.     Patient's hyperlipidemia is controlled with diet and medications. Patient denies myalgias or other medication SE's. Last lipids were at goal on RYRE: Lab Results  Component Value Date   CHOL 185 07/30/2016   HDL 68 07/30/2016   LDLCALC 93 07/30/2016   TRIG 122 07/30/2016   CHOLHDL 2.7 07/30/2016      Patient has prediabetes since 2011 with A1c 5.8% and he has lost about 17-18# since that time.  Patient denies reactive hypoglycemic symptoms, visual blurring, diabetic polys or paresthesias. Last A1c was at goal: Lab Results  Component Value Date   HGBA1C 5.6 04/23/2016       Finally, patient has history of Vitamin D Deficiency of "22" in 2008 and last vitamin D was sl elevated and dose was tapered: Lab Results  Component Value Date   VD25OH 113 (H) 07/30/2016   Current Outpatient Prescriptions on File Prior to Visit  Medication Sig  . acetaminophen (TYLENOL) 500 MG tablet Take 500 mg by mouth 2 (two) times daily as needed for moderate pain.   Marland Kitchen amiodarone (PACERONE) 200 MG tablet Take 100 mg by mouth daily.  . Ascorbic  Acid (VITAMIN C) 1000 MG tablet Take 1,000 mg by mouth daily.  Marland Kitchen aspirin 81 MG tablet Take 81 mg by mouth every Monday, Wednesday, and Friday.   . Calcium Carb-Cholecalciferol (CALCIUM 600 + D PO) Take 1 tablet by mouth daily.  . Cholecalciferol (VITAMIN D3) 5000 UNITS CAPS Take 10,000 Units by mouth daily.   . diphenhydrAMINE (BENADRYL) 25 MG tablet Take 25 mg by mouth at bedtime.   . docusate sodium (COLACE) 100 MG capsule Take 100 mg by mouth daily as needed for mild constipation.  . ferrous gluconate (IRON 27) 240 (27 FE) MG tablet Take 240 mg by mouth daily with lunch.  . fexofenadine (ALLEGRA) 180 MG tablet Take 180 mg by mouth daily.  . halobetasol (ULTRAVATE) 0.05 % cream Apply 1 application topically daily as needed (itching).  . Magnesium 250 MG TABS Take 250 mg by mouth 2 (two) times daily.   . metoprolol succinate (TOPROL-XL) 25 MG 24 hr tablet Take 12.5 mg by mouth daily with supper.   . Omega-3 Fatty Acids (FISH OIL PO) Take 1 capsule by mouth at bedtime.  [name] (SYSTANE OP) Apply 1 drop to eye daily as needed (dry eyes).  . polyethylene glycol (MIRALAX / GLYCOLAX) packet Take 17 g by mouth as needed for moderate constipation.   Marland Kitchen  Red Yeast Rice 600 MG TABS Take 1,200 mg by mouth daily with lunch.   No current facility-administered medications on file prior to visit.    Allergies  Allergen Reactions  . Lipitor [Atorvastatin]     Elevated cpk   . Mobic [Meloxicam] Other (See Comments)    edema  . Nsaids Other (See Comments)    dyspepsia   Past Medical History:  Diagnosis Date  . Ganglion cyst of wrist    LEFT WRIST  . GERD (gastroesophageal reflux disease)   . Hx of CABG 1992  . Hyperlipidemia   . ICD (implantable cardiac defibrillator) in place August 2010  . Ischemic cardiomyopathy    EF 32%  . MI, old    INFERIOR LATERAL  . Normal nuclear stress test June 2010   No ischemia. EF 32%  . OA (osteoarthritis) of knee   . Pre-diabetes    . S/P cardiac cath 2006   Occluded SVG to OM. Managed medically  . Vitamin D deficiency    Health Maintenance  Topic Date Due  . INFLUENZA VACCINE  10/01/2016  . TETANUS/TDAP  10/14/2025  . PNA vac Low Risk Adult  Completed   Immunization History  Administered Date(s) Administered  . Influenza, High Dose Seasonal PF 12/07/2013, 01/04/2015  . Influenza-Unspecified 01/01/2013  . Pneumococcal Conjugate-13 07/30/2016  . Pneumococcal Polysaccharide-23 07/02/2001, 06/01/2013  . Td 10/15/2015  . Tdap 03/04/2003   Past Surgical History:  Procedure Laterality Date  . CARDIAC CATHETERIZATION  10/14/2004   THERE IS POSTERIOR LATERAL HYPOKINESIA. EF 30-35%  . CARDIAC CATHETERIZATION  2006   OCCLUDED SVG TO OM  . CARDIAC DEFIBRILLATOR PLACEMENT  10/2008  . CORONARY ARTERY BYPASS GRAFT  1992  . INGUINAL HERNIA REPAIR    . KNEE SURGERY     RIGHT KNEE  . NUCLEAR STRESS TEST  2010   EF 32%. No ischemia  . PPM GENERATOR CHANGEOUT N/A 08/25/2016   Procedure: PPM Generator Changeout;  Surgeon: Deboraha Sprang, MD;  Location: [facility];  Service: Cardiovascular;  Laterality: N/A;   Family History  Problem Relation Age of Onset  . Cancer Mother        ABDOMINAL CANCER  . Hypertension Father   . Stroke Father   . Breast cancer Sister    Social History   Social History  . Marital status: Married    Spouse name: N/A  . Number of children: N/A  . Years of education: N/A   Occupational History  . Not on file.   Social History Main Topics  . Smoking status: Former Smoker    Quit date: 03/03/1982  . Smokeless tobacco: Never Used  . Alcohol use No  . Drug use: No  . Sexual activity: None    ROS Constitutional: Denies fever, chills, weight loss/gain, headaches, insomnia,  night sweats or change in appetite. Does c/o fatigue. Eyes: Denies redness, blurred vision, diplopia, discharge, itchy or watery eyes.  ENT: Denies discharge, congestion, post nasal drip, epistaxis, sore  throat, earache, hearing loss, dental pain, Tinnitus, Vertigo, Sinus pain or snoring.  Cardio: Denies chest pain, palpitations, irregular heartbeat, syncope, dyspnea, diaphoresis, orthopnea, PND, claudication or edema Respiratory: denies cough, dyspnea, DOE, pleurisy, hoarseness, laryngitis or wheezing.  Gastrointestinal: Denies dysphagia, heartburn, reflux, water brash, pain, cramps, nausea, vomiting, bloating, diarrhea, constipation, hematemesis, melena, hematochezia, jaundice or hemorrhoids Genitourinary: Denies dysuria, frequency, urgency, nocturia, hesitancy, discharge, hematuria or flank pain Musculoskeletal: Denies arthralgia, myalgia, stiffness, Jt. Swelling, pain, limp or strain/sprain. Denies Falls.  Skin: Denies puritis, rash, hives, warts, acne, eczema or change in skin lesion Neuro: No weakness, tremor, incoordination, spasms, paresthesia or pain Psychiatric: Denies confusion, memory loss or sensory loss. Denies Depression. Endocrine: Denies change in weight, skin, hair change, nocturia, and paresthesia, diabetic polys, visual blurring or hyper / hypo glycemic episodes.  Heme/Lymph: No excessive bleeding, bruising or enlarged lymph nodes.  Physical Exam  BP 132/80   Pulse 76   Temp 97.8 F (36.6 C)   Resp 18   Ht 5' 10.5" (1.791 m)   Wt 141 lb 6.4 oz (64.1 kg)   BMI 20.00 kg/m   General Appearance: Chronically ill appearing elderly , thin white man in no apparent distress.  Eyes: PERRLA, EOMs, conjunctiva no swelling or erythema, normal fundi and vessels. Sinuses: No frontal/maxillary tenderness ENT/Mouth: EACs patent / TMs  nl. Nares clear without erythema, swelling, mucoid exudates. Oral hygiene is good. No erythema, swelling, or exudate. Tongue normal, non-obstructing. Tonsils not swollen or erythematous. Hearing normal.  Neck: Supple, thyroid normal. No bruits, nodes or JVD. Respiratory: Respiratory effort normal.  BS equal and clear bilateral without rales, rhonci,  wheezing or stridor. Cardio: Heart sounds are normal with regular rate and rhythm and no murmurs, rubs or gallops. Peripheral pulses are normal and equal bilaterally without edema. No aortic or femoral bruits. Chest: symmetric with normal excursions and percussion.  Abdomen: Soft, with Nl bowel sounds. Nontender, no guarding, rebound, hernias, masses, or organomegaly.  Lymphatics: Non tender without lymphadenopathy.  Genitourinary: Deferred to age. Musculoskeletal:  Generalized decrease in muscle power, tone, bulk and broad based gait supported by a cane. Skin: Warm and dry without rashes, lesions, cyanosis, clubbing or  ecchymosis.  Neuro: Cranial nerves intact, reflexes equal bilaterally. Normal muscle tone, no cerebellar symptoms. Sensation intact.  Pysch: Alert and oriented X 3 with normal affect, insight and judgment appropriate.   Assessment and Plan  1. Essential hypertension  - EKG 12-Lead - [place], RETROPERITNL ABD,  LTD - Urinalysis, Routine w reflex microscopic - Microalbumin / creatinine urine ratio - CBC with Differential/Platelet - BASIC METABOLIC PANEL WITH GFR - Magnesium - TSH  2. Hyperlipidemia  - EKG 12-Lead - [place], RETROPERITNL ABD,  LTD - Hepatic function panel - Lipid panel - TSH  3. Prediabetes  - EKG 12-Lead - [place], RETROPERITNL ABD,  LTD - Hemoglobin A1c - Insulin, fasting  4. Vitamin D deficiency  - VITAMIN D 25 Hydroxy   5. Other abnormal glucose  - Hemoglobin A1c - Insulin, fasting  6. Gastroesophageal reflux disease  - CBC with Differential/Platelet  7. ASCAD s/p CABG (1992)  - Lipid panel  8. Colon cancer screening  - POC Hemoccult Bld/Stl   9. Prostate cancer screening  - PSA  10. Bladder neck obstruction  - PSA  11. Screening for ischemic heart disease  - EKG 12-Lead  12. Screening for AAA (aortic abdominal aneurysm)  - [place], RETROPERITNL ABD,  LTD  13. Medication management  - Urinalysis, Routine w reflex  microscopic - Microalbumin / creatinine urine ratio - CBC with Differential/Platelet - BASIC METABOLIC PANEL WITH GFR - Hepatic function panel - Magnesium - Lipid panel - TSH - Hemoglobin A1c - Insulin, fasting - VITAMIN D 25 Hydroxy       Patient was counseled in prudent diet, weight control to achieve/maintain BMI less than 25, BP monitoring, regular exercise and medications as discussed.  Discussed med effects and SE's. Routine screening labs and tests as requested with regular follow-up as recommended. Over  40 minutes of exam, counseling, chart review and high complex critical decision making was performed

## 2016-11-20 ENCOUNTER — Other Ambulatory Visit: Payer: Self-pay | Admitting: Internal Medicine

## 2016-11-20 DIAGNOSIS — N419 Inflammatory disease of prostate, unspecified: Secondary | ICD-10-CM

## 2016-11-20 LAB — URINALYSIS, ROUTINE W REFLEX MICROSCOPIC
Bilirubin Urine: NEGATIVE
Glucose, UA: NEGATIVE
HYALINE CAST: NONE SEEN /LPF
Ketones, ur: NEGATIVE
Nitrite: POSITIVE — AB
SQUAMOUS EPITHELIAL / LPF: NONE SEEN /HPF (ref <=5)
Specific Gravity, Urine: 1.021 (ref 1.001–1.03)
pH: 5.5 (ref 5.0–8.0)

## 2016-11-20 LAB — HEMOGLOBIN A1C
Hgb A1c MFr Bld: 5.5 % of total Hgb (ref <5.7)
Mean Plasma Glucose: 111 (calc)
eAG (mmol/L): 6.2 (calc)

## 2016-11-20 LAB — BASIC METABOLIC PANEL WITH GFR
BUN/Creatinine Ratio: 21 (calc) (ref 6–22)
BUN: 23 mg/dL (ref 7–25)
CALCIUM: 9.2 mg/dL (ref 8.6–10.3)
CO2: 28 mmol/L (ref 20–32)
CREATININE: 1.12 mg/dL — AB (ref 0.70–1.11)
Chloride: 102 mmol/L (ref 98–110)
GFR, EST AFRICAN AMERICAN: 68 mL/min/{1.73_m2} (ref >=60)
GFR, Est Non African American: 59 mL/min/{1.73_m2} — ABNORMAL LOW (ref >=60)
Glucose, Bld: 101 mg/dL — ABNORMAL HIGH (ref 65–99)
Potassium: 4.7 mmol/L (ref 3.5–5.3)
Sodium: 137 mmol/L (ref 135–146)

## 2016-11-20 LAB — CBC WITH DIFFERENTIAL/PLATELET
BASOS PCT: 0.6 %
Basophils Absolute: 38 cells/uL (ref 0–200)
EOS ABS: 218 {cells}/uL (ref 15–500)
Eosinophils Relative: 3.4 %
HCT: 33.3 % — ABNORMAL LOW (ref 38.5–50.0)
HEMOGLOBIN: 11.3 g/dL — AB (ref 13.2–17.1)
LYMPHS ABS: 1325 {cells}/uL (ref 850–3900)
MCH: 32.4 pg (ref 27.0–33.0)
MCHC: 33.9 g/dL (ref 32.0–36.0)
MCV: 95.4 fL (ref 80.0–100.0)
MPV: 11 fL (ref 7.5–12.5)
Monocytes Relative: 9.2 %
NEUTROS ABS: 4230 {cells}/uL (ref 1500–7800)
Neutrophils Relative %: 66.1 %
PLATELETS: 236 10*3/uL (ref 140–400)
RBC: 3.49 10*6/uL — AB (ref 4.20–5.80)
RDW: 12.5 % (ref 11.0–15.0)
Total Lymphocyte: 20.7 %
WBC: 6.4 10*3/uL (ref 3.8–10.8)
WBCMIX: 589 {cells}/uL (ref 200–950)

## 2016-11-20 LAB — MICROALBUMIN / CREATININE URINE RATIO
CREATININE, URINE: 131 mg/dL (ref 20–320)
MICROALB/CREAT RATIO: 23 ug/mg{creat} (ref <30)
Microalb, Ur: 3 mg/dL

## 2016-11-20 LAB — LIPID PANEL
CHOLESTEROL: 198 mg/dL (ref <200)
HDL: 75 mg/dL (ref >40)
LDL Cholesterol (Calc): 104 mg/dL (calc) — ABNORMAL HIGH
Non-HDL Cholesterol (Calc): 123 mg/dL (calc) (ref <130)
TRIGLYCERIDES: 93 mg/dL (ref <150)
Total CHOL/HDL Ratio: 2.6 (calc) (ref <5.0)

## 2016-11-20 LAB — TSH: TSH: 3.24 mIU/L (ref 0.40–4.50)

## 2016-11-20 LAB — HEPATIC FUNCTION PANEL
AG RATIO: 1.6 (calc) (ref 1.0–2.5)
ALBUMIN MSPROF: 4 g/dL (ref 3.6–5.1)
ALT: 12 U/L (ref 9–46)
AST: 16 U/L (ref 10–35)
Alkaline phosphatase (APISO): 84 U/L (ref 40–115)
BILIRUBIN DIRECT: 0.1 mg/dL (ref 0.0–0.2)
GLOBULIN: 2.5 g/dL (ref 1.9–3.7)
Indirect Bilirubin: 0.3 mg/dL (calc) (ref 0.2–1.2)
TOTAL PROTEIN: 6.5 g/dL (ref 6.1–8.1)
Total Bilirubin: 0.4 mg/dL (ref 0.2–1.2)

## 2016-11-20 LAB — VITAMIN D 25 HYDROXY (VIT D DEFICIENCY, FRACTURES): Vit D, 25-Hydroxy: 123 ng/mL — ABNORMAL HIGH (ref 30–100)

## 2016-11-20 LAB — INSULIN, FASTING: INSULIN: 3.3 u[IU]/mL (ref 2.0–19.6)

## 2016-11-20 LAB — PSA

## 2016-11-20 LAB — MAGNESIUM: MAGNESIUM: 2.3 mg/dL (ref 1.5–2.5)

## 2016-11-20 MED ORDER — CIPROFLOXACIN HCL 250 MG PO TABS: ORAL_TABLET | ORAL | 0 refills | Status: AC

## 2016-11-21 ENCOUNTER — Telehealth: Payer: Self-pay

## 2016-11-21 NOTE — Telephone Encounter (Signed)
Call to wife.  Advised the current device that patient has will not show fluid levels and I will not be calling on a monthly basis.  Explained device will continue to be monitored by device clinic every 3 months and to call the office if patient has any fluid symptoms.  She understood.  Patient no longer in [facility] clinic.

## 2016-11-27 ENCOUNTER — Ambulatory Visit (INDEPENDENT_AMBULATORY_CARE_PROVIDER_SITE_OTHER): Payer: Medicare Other | Admitting: Internal Medicine

## 2016-11-27 ENCOUNTER — Encounter: Payer: Self-pay | Admitting: Internal Medicine

## 2016-11-27 VITALS — BP 120/62 | HR 86 | Ht 71.0 in | Wt 145.0 lb

## 2016-11-27 DIAGNOSIS — I255 Ischemic cardiomyopathy: Secondary | ICD-10-CM | POA: Diagnosis not present

## 2016-11-27 DIAGNOSIS — I5022 Chronic systolic (congestive) heart failure: Secondary | ICD-10-CM

## 2016-11-27 DIAGNOSIS — Z95810 Presence of automatic (implantable) cardiac defibrillator: Secondary | ICD-10-CM

## 2016-11-27 DIAGNOSIS — I2589 Other forms of chronic ischemic heart disease: Secondary | ICD-10-CM

## 2016-11-27 DIAGNOSIS — I493 Ventricular premature depolarization: Secondary | ICD-10-CM

## 2016-11-27 DIAGNOSIS — Z79899 Other long term (current) drug therapy: Secondary | ICD-10-CM | POA: Diagnosis not present

## 2016-11-27 MED ORDER — AMIODARONE HCL 200 MG PO TABS: 100.0000 mg | ORAL_TABLET | Freq: Every day | ORAL | 3 refills | Status: DC

## 2016-11-27 NOTE — Progress Notes (Signed)
Patient Care Team: Unk Pinto, MD as PCP - General (Internal Medicine) Unk Pinto, MD (Internal Medicine) Darleen Crocker, MD as Consulting Physician (Ophthalmology) Deboraha Sprang, MD as Consulting Physician (Cardiology)   HPI  Richard Moody is a 83 y.o. male seen in followup for ICD implanted in 2010. He has sinus node dysfunction and is atrial pacing.  Underwent ICD downgrade 6/18.  He has a history of ischemic heart disease with prior bypass surgery depressed left ventricular function with EFof 32%. He has  chronic systolic heart failure but has done relatively well.  Echo 7/16 EF 35-40%  He was noted to have significant PVCs that we were concerned about injury to his cardiomyopathy  Amiodarone was initiated.  Patient denies symptoms of GI intolerance, sun sensitivity, neurological symptoms attributable to amiodarone.  Surveillance laboratories were in normal limits when checked 1 months  ago  Mild peripheral edema. There has been no change in functional status. He denies chest pain shortness of breath nocturnal dyspnea and palpitations.  His brother-in-law was in town over the hurricane and the diet change with cocaine  Date TSH LFTs Cr  8/17  3.8 13    11  /17  4.26 15    5/18 3.09 11 1.2  9/18 3.24 12 1.12           Past Medical History:  Diagnosis Date  . Ganglion cyst of wrist    LEFT WRIST  . GERD (gastroesophageal reflux disease)   . Hx of CABG 1992  . Hyperlipidemia   . ICD (implantable cardiac defibrillator) in place August 2010  . Ischemic cardiomyopathy    EF 32%  . MI, old    INFERIOR LATERAL  . Normal nuclear stress test June 2010   No ischemia. EF 32%  . OA (osteoarthritis) of knee   . Pre-diabetes   . S/P cardiac cath 2006   Occluded SVG to OM. Managed medically  . Vitamin D deficiency     Past Surgical History:  Procedure Laterality Date  . CARDIAC CATHETERIZATION  10/14/2004   THERE IS POSTERIOR LATERAL HYPOKINESIA. EF  30-35%  . CARDIAC CATHETERIZATION  2006   OCCLUDED SVG TO OM  . CARDIAC DEFIBRILLATOR PLACEMENT  10/2008  . CORONARY ARTERY BYPASS GRAFT  1992  . INGUINAL HERNIA REPAIR    . KNEE SURGERY     RIGHT KNEE  . NUCLEAR STRESS TEST  2010   EF 32%. No ischemia  . PPM GENERATOR CHANGEOUT N/A 08/25/2016   Procedure: PPM Generator Changeout;  Surgeon: Deboraha Sprang, MD;  Location: [facility];  Service: Cardiovascular;  Laterality: N/A;    Current Outpatient Prescriptions  Medication Sig Dispense Refill  . acetaminophen (TYLENOL) 500 MG tablet Take 500 mg by mouth 2 (two) times daily as needed for moderate pain.     Marland Kitchen amiodarone (PACERONE) 200 MG tablet Take 100 mg by mouth daily.    . Ascorbic Acid (VITAMIN C) 1000 MG tablet Take 1,000 mg by mouth daily.    Marland Kitchen aspirin 81 MG tablet Take 81 mg by mouth every Monday, Wednesday, and Friday.     . Calcium Carb-Cholecalciferol (CALCIUM 600 + D PO) Take 1 tablet by mouth daily.    . Cholecalciferol (VITAMIN D3) 5000 UNITS CAPS Take 10,000 Units by mouth daily.     . ciprofloxacin (CIPRO) 250 MG tablet Take 1 tablet 2 x / day with food for Infection 60 tablet 0  . diphenhydrAMINE (BENADRYL) 25  MG tablet Take 25 mg by mouth at bedtime.     . docusate sodium (COLACE) 100 MG capsule Take 100 mg by mouth daily as needed for mild constipation.    . ferrous gluconate (IRON 27) 240 (27 FE) MG tablet Take 240 mg by mouth daily with lunch.    . fexofenadine (ALLEGRA) 180 MG tablet Take 180 mg by mouth daily.    . halobetasol (ULTRAVATE) 0.05 % cream Apply 1 application topically daily as needed (itching).    . Magnesium 250 MG TABS Take 250 mg by mouth 2 (two) times daily.     . metoprolol succinate (TOPROL-XL) 25 MG 24 hr tablet Take 12.5 mg by mouth daily with supper.     . Omega-3 Fatty Acids (FISH OIL PO) Take 1 capsule by mouth at bedtime.    [name] (SYSTANE OP) Apply 1 drop to eye daily as needed (dry eyes).    .  polyethylene glycol (MIRALAX / GLYCOLAX) packet Take 17 g by mouth as needed for moderate constipation.     . Red Yeast Rice 600 MG TABS Take 1,200 mg by mouth daily with lunch.     No current facility-administered medications for this visit.     Allergies  Allergen Reactions  . Lipitor [Atorvastatin]     Elevated cpk   . Mobic [Meloxicam] Other (See Comments)    edema  . Nsaids Other (See Comments)    dyspepsia    Review of Systems negative except from HPI and PMH  Physical Exam BP 120/62   Pulse 86   Ht 5\' 11"  (1.803 m)   Wt 145 lb (65.8 kg)   SpO2 97%   BMI 20.22 kg/m  Well developed and nourished in no acute distress HENT normal Neck supple with JVP-flat Carotids brisk and full without bruits Clear Regular rate and rhythm, no murmurs or gallops Abd-soft with active BS without hepatomegaly No Clubbing cyanosis edema Skin-warm and dry A & Oriented  Grossly normal sensory and motor function  ECG  Apacing 77 36/11/42   Assessment and  Plan  Ischemic cardiomyopathy    Congestive heart failure-chronic-systolic     Implantable  defibrillator-[employer]  The patient's device was interrogated.  The information was reviewed. No changes were made in the programming.    PVCs  Sinus bradycardia  1AVB   Without symptoms of ischemia  Mild edema, but without symptoms so will continue current meds And have encouraged him to decrease sodium and fluid intak  PVC well suppressed-- tolerating amio and so will continue  1AVB  Is stable

## 2016-11-27 NOTE — Patient Instructions (Addendum)
Medication Instructions:  Your physician recommends that you continue on your current medications as directed. Please refer to the Current Medication list given to you today.  -- If you need a refill on your cardiac medications before your next appointment, please call your pharmacy. --  Labwork: None ordered  Testing/Procedures: None ordered  Follow-Up: Remote monitoring is used to monitor your Pacemaker of ICD from home. This monitoring reduces the number of office visits required to check your device to one time per year. It allows [place] to keep an eye on the functioning of your device to ensure it is working properly. You are scheduled for a device check from home on 02/26/2017. You may send your transmission at any time that day. If you have a wireless device, the transmission will be sent automatically. After your physician reviews your transmission, you will receive a postcard with your next transmission date.  Your physician wants you to follow-up in: 9 months with Dr. Caryl Comes.  You will receive a reminder letter in the mail two months in advance. If you don't receive a letter, please call our office to schedule the follow-up appointment.  Thank you for choosing [facility]!!

## 2016-12-29 ENCOUNTER — Encounter: Payer: Self-pay | Admitting: Podiatry

## 2016-12-29 ENCOUNTER — Ambulatory Visit (INDEPENDENT_AMBULATORY_CARE_PROVIDER_SITE_OTHER): Payer: Medicare Other | Admitting: Podiatry

## 2016-12-29 VITALS — BP 120/66 | HR 78 | Ht 71.0 in | Wt 140.0 lb

## 2016-12-29 DIAGNOSIS — M2040 Other hammer toe(s) (acquired), unspecified foot: Secondary | ICD-10-CM

## 2016-12-29 DIAGNOSIS — I255 Ischemic cardiomyopathy: Secondary | ICD-10-CM | POA: Diagnosis not present

## 2016-12-29 DIAGNOSIS — B351 Tinea unguium: Secondary | ICD-10-CM | POA: Diagnosis not present

## 2016-12-29 DIAGNOSIS — I739 Peripheral vascular disease, unspecified: Secondary | ICD-10-CM

## 2016-12-29 NOTE — Patient Instructions (Addendum)
Seen for hypertrophic nails and lesion on 3rd toe left.. All nails debrided. 3rd toe lesion debrided and dressed. Return in 3 months or sooner if needed.

## 2016-12-29 NOTE — Progress Notes (Signed)
SUBJECTIVE: 83 y.o. year old male presents requesting toe nails trimmed. Patient is accompanied by his wife. He has problem with hearing.  HPI: Wears compression socks. Been treated for Cardiomyopathy, defibrilator was replaced with pacemaker, multiple Bypass surgery done, several heart attack, not treated for diabetes. Currently on diuretics. Right knee replacement several years ago, and Left knee hurts now.  Review of Systems  Constitutional: Negative for chills, fever and weight loss.  HENT: Positive for hearing loss.   Eyes: Negative for blurred vision, double vision and photophobia.  Respiratory: Negative for cough, shortness of breath and wheezing.   Cardiovascular: Positive for leg swelling. Negative for chest pain, palpitations and orthopnea.  Gastrointestinal: Negative for heartburn, nausea and vomiting.  Genitourinary: Negative for dysuria, frequency and urgency.  Musculoskeletal: Positive for joint pain. Negative for back pain and neck pain.  Skin: Negative for itching and rash.  Neurological: Negative.   Psychiatric/Behavioral: Negative.      OBJECTIVE: DERMATOLOGIC EXAMINATION: Nails: Dystrophic nail with pain on 3rd left. Ulcerating corn with intradermal bleeding 3rd digit at distal end left foot. VASCULAR EXAMINATION OF LOWER LIMBS: Pedal pulses are not palpable.  Mild ankle edema bilateral. Mild eschemic changes both feet. Temperature gradient from tibial crest to dorsum of foot is within normal bilateral.  NEUROLOGIC EXAMINATION OF THE LOWER LIMBS: Achilles DTR is present and within normal. Monofilament (Semmes-Weinstein 10-gm) sensory testing positive 6 out of 6, bilateral. Vibratory sensations(128Hz  turning fork) intact at medial and lateral forefoot bilateral.  Sharp and Dull discriminatory sensations at the plantar ball of hallux is intact bilateral.   MUSCULOSKELETAL EXAMINATION: Positive for contracted 3rd toe left with ulcerating digital  corn.  ASSESSMENT: Onychomycosis x 10. Ulcerating digital corn 3rd left, limited to subdermal bleeding without broken skin. Painful toes. PVD.  PLAN: Reviewed findings and available treatment options. All nails and digital lesions debrided. Amerigel ointment dressing applied to protect distal end of 3rd digit left with home care instruction to keep it covered for several days. Return in 3 months or sooner if needed.

## 2017-02-05 ENCOUNTER — Other Ambulatory Visit: Payer: Self-pay | Admitting: *Deleted

## 2017-02-05 ENCOUNTER — Ambulatory Visit (INDEPENDENT_AMBULATORY_CARE_PROVIDER_SITE_OTHER): Payer: Medicare Other | Admitting: *Deleted

## 2017-02-05 DIAGNOSIS — N419 Inflammatory disease of prostate, unspecified: Secondary | ICD-10-CM

## 2017-02-05 DIAGNOSIS — N400 Enlarged prostate without lower urinary tract symptoms: Secondary | ICD-10-CM

## 2017-02-05 DIAGNOSIS — R7303 Prediabetes: Secondary | ICD-10-CM | POA: Diagnosis not present

## 2017-02-05 NOTE — Addendum Note (Signed)
Addended by: Melbourne Abts C on: 02/05/2017 01:39 PM   Modules accepted: Orders

## 2017-02-05 NOTE — Progress Notes (Signed)
Patient here for a 3 month NV to recheck a UA and C and S due to a prostate infection.  Per the patient's daughter, she is not sure he completed the Cipro.  The patient's wife had heart surgery and the daughter states the Cipro RX was gone when she arrived 01/01/2017.  She states the patient seems more confused in recent days.

## 2017-02-06 LAB — URINALYSIS, ROUTINE W REFLEX MICROSCOPIC
Bilirubin Urine: NEGATIVE
GLUCOSE, UA: NEGATIVE
Hgb urine dipstick: NEGATIVE
Ketones, ur: NEGATIVE
LEUKOCYTES UA: NEGATIVE
Nitrite: NEGATIVE
PROTEIN: NEGATIVE
Specific Gravity, Urine: 1.025 (ref 1.001–1.03)
pH: 6 (ref 5.0–8.0)

## 2017-02-06 LAB — URINE CULTURE
MICRO NUMBER:: 81375202
Result:: NO GROWTH
SPECIMEN QUALITY:: ADEQUATE

## 2017-02-11 NOTE — Progress Notes (Signed)
Assessment and Plan:  Diagnoses and all orders for this visit:  Cold hands and feet without peripheral vascular disease Unremarkable on exam; intact/symmetrical distal pulses, blanches quickly, no nonhealing wounds  Patient and family member reassured; discussed may need to wear warmer socks/gloves at night related to recent drop in temperatures locally  Discussed vascular studies unlikely will reveal any actionable results  May consider lighter compression therapy; advised to discuss with cardiology    Essential hypertension Well controlled in office today; continue medication Monitor blood pressure at home; call if consistently over 130/80 Continue DASH diet.   Reminder to go to the ER if any CP, SOB, nausea, dizziness, severe HA, changes vision/speech, left arm numbness and tingling and jaw pain.  Further disposition pending results of labs. Discussed med's effects and SE's.   Over 15 minutes of exam, counseling, chart review, and critical decision making was performed.   Future Appointments  Date Time Provider [place]  02/26/2017  3:20 PM [facility] DEVICE REMOTES [facility]  03/17/2017 11:30 AM Liane Comber, NP [facility] None  03/31/2017 10:30 AM Camelia Phenes, DPM [facility]  05/26/2017  2:30 PM Unk Pinto, MD [facility] None  12/17/2017  2:00 PM Unk Pinto, MD [facility] None    ------------------------------------------------------------------------------------------------------------------   HPI BP 110/60   Pulse 79   Temp 97.7 F (36.5 C)   Wt 140 lb (63.5 kg)   SpO2 98%   BMI 19.53 kg/m   83 y.o.male with hx of htn, CHF, ASCAD s/p CABG (1992) presents accompanied by his son for concerns about cold extremities over the past month. He reports his home health nurses have noted that his lower extremities have been cold/somewhat injected after removing compression hose for severe combined systolic/diastolic CHF.  He denies  notable symptoms while he wears compression - numbness, tingling, pain - he reports he has also noted cold feet at night, and cold hands during the day (reports a "tingly" sensation that improves with shaking his hands). He denies LE edema, wounds, CP/palpitations, dyspnea, wheezing, cough.    Past Medical History:  Diagnosis Date  . Ganglion cyst of wrist    LEFT WRIST  . GERD (gastroesophageal reflux disease)   . Hx of CABG 1992  . Hyperlipidemia   . ICD (implantable cardiac defibrillator) in place August 2010  . Ischemic cardiomyopathy    EF 32%  . MI, old    INFERIOR LATERAL  . Normal nuclear stress test June 2010   No ischemia. EF 32%  . OA (osteoarthritis) of knee   . Pre-diabetes   . S/P cardiac cath 2006   Occluded SVG to OM. Managed medically  . Vitamin D deficiency      Allergies  Allergen Reactions  . Lipitor [Atorvastatin]     Elevated cpk   . Mobic [Meloxicam] Other (See Comments)    edema  . Nsaids Other (See Comments)    dyspepsia    Current Outpatient Medications on File Prior to Visit  Medication Sig  . acetaminophen (TYLENOL) 500 MG tablet Take 500 mg by mouth 2 (two) times daily as needed for moderate pain.   Marland Kitchen amiodarone (PACERONE) 200 MG tablet Take 0.5 tablets (100 mg total) by mouth daily.  . Ascorbic Acid (VITAMIN C) 1000 MG tablet Take 1,000 mg by mouth daily.  Marland Kitchen aspirin 81 MG tablet Take 81 mg by mouth every Monday, Wednesday, and Friday.   . Calcium Carb-Cholecalciferol (CALCIUM 600 + D PO) Take 1 tablet by mouth daily.  Marland Kitchen  Cholecalciferol (VITAMIN D3) 5000 UNITS CAPS Take 10,000 Units by mouth daily.   . diphenhydrAMINE (BENADRYL) 25 MG tablet Take 25 mg by mouth at bedtime.   . docusate sodium (COLACE) 100 MG capsule Take 100 mg by mouth daily as needed for mild constipation.  . ferrous gluconate (IRON 27) 240 (27 FE) MG tablet Take 240 mg by mouth daily with lunch.  . fexofenadine (ALLEGRA) 180 MG tablet Take 180 mg by mouth daily.  .  halobetasol (ULTRAVATE) 0.05 % cream Apply 1 application topically daily as needed (itching).  . Magnesium 250 MG TABS Take 250 mg by mouth 2 (two) times daily.   . metoprolol succinate (TOPROL-XL) 25 MG 24 hr tablet Take 12.5 mg by mouth daily with supper.   . Omega-3 Fatty Acids (FISH OIL PO) Take 1 capsule by mouth at bedtime.  [name] (SYSTANE OP) Apply 1 drop to eye daily as needed (dry eyes).  . polyethylene glycol (MIRALAX / GLYCOLAX) packet Take 17 g by mouth as needed for moderate constipation.   . Red Yeast Rice 600 MG TABS Take 1,200 mg by mouth daily with lunch.   No current facility-administered medications on file prior to visit.     ROS: Review of Systems  Respiratory: Negative for cough, shortness of breath and wheezing.   Cardiovascular: Negative for chest pain, palpitations and leg swelling.  Skin:       Denies recent injuries to extremities; has had recent venous stasis ulcer which is no longer present with compression therapy  Neurological: Negative for tingling and sensory change.   Physical Exam:  BP 110/60   Pulse 79   Temp 97.7 F (36.5 C)   Wt 140 lb (63.5 kg)   SpO2 98%   BMI 19.53 kg/m   General Appearance: Well nourished, in no apparent distress. Neck: Supple.  Respiratory: Respiratory effort normal, BS equal bilaterally without rales, rhonchi, wheezing or stridor.  Cardio: S1/S2 audible with irregular rhythmn. Symmetrical 1+ posterior tibial and pedal pulses; feet blanch quickly bilaterally, not notably cold to touch. No edema present. Bilateral feet/ankles blanch quickly throughout.  Abdomen: Soft, + BS.  Non tender, no guarding, rebound, hernias, masses.  Musculoskeletal: Full ROM, symmetrical strength, slow gait with walker.  Skin: Warm, dry without rashes, lesions, ecchymosis. Bilateral lower extremities with thickened/calloused skin to base of 3rd digits; no open wounds, non-injected, no signs of infection.  Neuro:   Sensation intact.  Psych: Awake and oriented X 3, normal affect, Insight and Judgment appropriate.     Izora Ribas, NP 12:21 PM [facility]

## 2017-02-12 ENCOUNTER — Encounter: Payer: Self-pay | Admitting: Adult Health

## 2017-02-12 ENCOUNTER — Ambulatory Visit (INDEPENDENT_AMBULATORY_CARE_PROVIDER_SITE_OTHER): Payer: Medicare Other | Admitting: Adult Health

## 2017-02-12 VITALS — BP 110/60 | HR 79 | Temp 97.7°F | Wt 140.0 lb

## 2017-02-12 DIAGNOSIS — I255 Ischemic cardiomyopathy: Secondary | ICD-10-CM

## 2017-02-12 DIAGNOSIS — I10 Essential (primary) hypertension: Secondary | ICD-10-CM

## 2017-02-12 DIAGNOSIS — R208 Other disturbances of skin sensation: Secondary | ICD-10-CM | POA: Diagnosis not present

## 2017-02-19 ENCOUNTER — Ambulatory Visit: Payer: Self-pay | Admitting: Adult Health

## 2017-02-26 ENCOUNTER — Ambulatory Visit (INDEPENDENT_AMBULATORY_CARE_PROVIDER_SITE_OTHER): Payer: Medicare Other | Admitting: *Deleted

## 2017-02-26 DIAGNOSIS — I255 Ischemic cardiomyopathy: Secondary | ICD-10-CM

## 2017-02-26 NOTE — Progress Notes (Signed)
Remote pacemaker transmission.   

## 2017-02-27 ENCOUNTER — Other Ambulatory Visit: Payer: Self-pay | Admitting: *Deleted

## 2017-02-27 ENCOUNTER — Encounter: Payer: Self-pay | Admitting: Cardiology

## 2017-02-27 MED ORDER — METOPROLOL SUCCINATE ER 25 MG PO TB24: 12.5000 mg | ORAL_TABLET | Freq: Every day | ORAL | 1 refills | Status: AC

## 2017-03-10 LAB — CUP PACEART REMOTE DEVICE CHECK
Brady Statistic AP VP Percent: 17.54 %
Brady Statistic AP VS Percent: 73.17 %
Brady Statistic AS VP Percent: 2.98 %
Brady Statistic RA Percent Paced: 90.33 %
Brady Statistic RV Percent Paced: 20.79 %
Implantable Lead Implant Date: 20100805
Implantable Lead Location: 753859
Implantable Lead Location: 753860
Implantable Lead Model: 5076
Lead Channel Impedance Value: 323 Ohm
Lead Channel Impedance Value: 361 Ohm
Lead Channel Pacing Threshold Pulse Width: 0.4 ms
Lead Channel Sensing Intrinsic Amplitude: 10.875 mV
Lead Channel Sensing Intrinsic Amplitude: 10.875 mV
Lead Channel Sensing Intrinsic Amplitude: 2.625 mV
Lead Channel Setting Pacing Amplitude: 1.5 V
Lead Channel Setting Pacing Amplitude: 2.5 V
Lead Channel Setting Pacing Pulse Width: 0.4 ms
Lead Channel Setting Sensing Sensitivity: 2.8 mV
MDC IDC LEAD IMPLANT DT: 20100805
MDC IDC MSMT BATTERY REMAINING LONGEVITY: 162 mo
MDC IDC MSMT BATTERY VOLTAGE: 3.16 V
MDC IDC MSMT LEADCHNL RA IMPEDANCE VALUE: 399 Ohm
MDC IDC MSMT LEADCHNL RA IMPEDANCE VALUE: 532 Ohm
MDC IDC MSMT LEADCHNL RA PACING THRESHOLD AMPLITUDE: 0.75 V
MDC IDC MSMT LEADCHNL RA SENSING INTR AMPL: 2.625 mV
MDC IDC MSMT LEADCHNL RV PACING THRESHOLD AMPLITUDE: 0.875 V
MDC IDC MSMT LEADCHNL RV PACING THRESHOLD PULSEWIDTH: 0.4 ms
MDC IDC PG IMPLANT DT: 20180625
MDC IDC SESS DTM: 20181227054221
MDC IDC STAT BRADY AS VS PERCENT: 6.29 %

## 2017-03-16 NOTE — Progress Notes (Signed)
MEDICARE ANNUAL WELLNESS VISIT AND FOLLOW UP Assessment:   Diagnoses and all orders for this visit:  Medicare annual wellness visit, subsequent  ASCAD s/p CABG (1992) Control blood pressure, cholesterol, glucose, increase exercise.   Essential hypertension Continue medications Monitor blood pressure at home; call if consistently over 130/80 Continue DASH diet.   Reminder to go to the ER if any CP, SOB, nausea, dizziness, severe HA, changes vision/speech, left arm numbness and tingling and jaw pain.  Chronic combined systolic and diastolic congestive heart failure (HCC) Stable; continue monitoring and follow up with cardiology  Sinus node dysfunction ([place]) S/p pacemaker; continue follow up with cardiology  Obstructive chronic bronchitis without exacerbation ([place]) Stable without medication; continue to monitor  Gastroesophageal reflux disease, esophagitis presence not specified Well managed on current regimen of OTC medications as needed Discussed diet, avoiding triggers and other lifestyle changes  Neurodermatitis Continue topical steroid; avoid scratching, moisturize daily  OAB (overactive bladder) Patient denies recent incontinence episodes; has had rarely in the past related to poor mobility  Discussed to notify us should episodes become more frequent  Automatic implantable cardioverter-defibrillator in situ       Battery is dead; on amiodarone therapy; continue follow up with cardiology  Prediabetes Discussed disease and risks of elevated glucose Discussed diet/exercise, weight management  -     BASIC METABOLIC PANEL WITH GFR  Vitamin D deficiency Above goal at last visit; adjust to maintain between 70-100 -     VITAMIN D 25 Hydroxy (Vit-D Deficiency, Fractures)  Hyperlipidemia At goal; currently treated by red rice yeast supplement - would like to trial coming off of this medication; discussed this is appropriate secondary to age, will follow up with lipid panel  in 3 months Continue low cholesterol diet and exercise.  -     Lipid panel -     TSH  Medication management -     CBC with Differential/Platelet -     BASIC METABOLIC PANEL WITH GFR -     Hepatic function panel  Hyperkalemia (Hyporeninemic HypoAldosteronism) Continue to monitor kidney function -     BASIC METABOLIC PANEL WITH GFR  Fall, sequela Falls screening and home safety reviewed today, no falls this past year. Significantly improved post PT with walker use.   Pill burden Will trial stopping red rice yeast and iron supplement for next 3 months; will follow up with labs at next visit and discuss continuation/necessity of these agents.   Over 30 minutes of exam, counseling, chart review, and critical decision making was performed  Future Appointments  Date Time Provider [place]  03/31/2017 10:30 AM Camelia Phenes, DPM [facility]  05/28/2017 11:55 AM [facility] DEVICE REMOTES [facility]  06/15/2017  2:30 PM Unk Pinto, MD [facility] None  12/17/2017  2:00 PM Unk Pinto, MD [facility] None     Plan:   During the course of the visit the patient was educated and counseled about appropriate screening and preventive services including:    Pneumococcal vaccine   Influenza vaccine  Prevnar 13  Td vaccine  Screening electrocardiogram  Colorectal cancer screening  Diabetes screening  Glaucoma screening  Nutrition counseling    Subjective:  Richard Moody is a 84 y.o. male accompanied by his daughter who presents for Medicare Annual Wellness Visit and 3 month follow up for HTN, hyperlipidemia, hx of abnormal glucose, and vitamin D Def. HTN predates since 2003 and hx/o ASHD from CABG in 1992. In 2010 he had a PM/AICD implanted by Dr  Caryl Comes and recently had a change out  In June 2018 with a PPM since he had no discharges. He is on amiodarone 100 mg daily. His daughter is concerned about pill burden today; red rice yeast supplements  in particular are difficult as very large. He is also on iron supplement for extended period of time without recent changes in hgb -    He has a history of Combined Systolic and Diastolic (EF 62-69% in 4854), denies dyspnea on exertion, orthopnea, paroxysmal nocturnal dyspnea and edema. Positive for none. Wt Readings from Last 3 Encounters:  03/17/17 142 lb (64.4 kg)  02/12/17 140 lb (63.5 kg)  12/29/16 140 lb (63.5 kg)   His blood pressure has been controlled at home, today their BP is BP: 104/64 He does not workout. He denies chest pain, shortness of breath, dizziness.   He is on cholesterol medication and denies myalgias. His cholesterol is at goal. The cholesterol last visit was:  Lab Results  Component Value Date   CHOL 198 11/19/2016   HDL 75 11/19/2016   LDLCALC 93 07/30/2016   TRIG 93 11/19/2016   CHOLHDL 2.6 11/19/2016   He has not been working on diet (poor appetite, encouraged to increase frequency of meals, high protein recommended) and exercise for glucose management, and denies foot ulcerations, nausea, paresthesia of the feet, polydipsia, polyuria, visual disturbances and vomiting. Last A1C in the office was:  Lab Results  Component Value Date   HGBA1C 5.5 11/19/2016   Last GFR Lab Results  Component Value Date   GFRNONAA 59 (L) 11/19/2016    Patient is on Vitamin D supplement and was above goal at the last visit:    Lab Results  Component Value Date   VD25OH 123 (H) 11/19/2016      Medication Review: Current Outpatient Medications on File Prior to Visit  Medication Sig Dispense Refill  . acetaminophen (TYLENOL) 500 MG tablet Take 500 mg by mouth 2 (two) times daily as needed for moderate pain.     Marland Kitchen amiodarone (PACERONE) 200 MG tablet Take 0.5 tablets (100 mg total) by mouth daily. 45 tablet 3  . Ascorbic Acid (VITAMIN C) 1000 MG tablet Take 1,000 mg by mouth daily.    Marland Kitchen aspirin 81 MG tablet Take 81 mg by mouth every Monday, Wednesday, and Friday.     .  Calcium Carb-Cholecalciferol (CALCIUM 600 + D PO) Take 1 tablet by mouth daily.    . Cholecalciferol (VITAMIN D3) 5000 UNITS CAPS Take 10,000 Units by mouth daily.     . diphenhydrAMINE (BENADRYL) 25 MG tablet Take 25 mg by mouth at bedtime.     . docusate sodium (COLACE) 100 MG capsule Take 100 mg by mouth daily as needed for mild constipation.    . ferrous gluconate (IRON 27) 240 (27 FE) MG tablet Take 240 mg by mouth daily with lunch.    . fexofenadine (ALLEGRA) 180 MG tablet Take 180 mg by mouth daily.    . halobetasol (ULTRAVATE) 0.05 % cream Apply 1 application topically daily as needed (itching).    . Magnesium 250 MG TABS Take 250 mg by mouth 2 (two) times daily.     . metoprolol succinate (TOPROL-XL) 25 MG 24 hr tablet Take 0.5 tablets (12.5 mg total) by mouth daily with supper. 45 tablet 1  . Omega-3 Fatty Acids (FISH OIL PO) Take 1 capsule by mouth at bedtime.    [name] (SYSTANE OP) Apply 1 drop to eye daily as  needed (dry eyes).    . polyethylene glycol (MIRALAX / GLYCOLAX) packet Take 17 g by mouth as needed for moderate constipation.     . Red Yeast Rice 600 MG TABS Take 1,200 mg by mouth daily with lunch.     No current facility-administered medications on file prior to visit.     Allergies: Allergies  Allergen Reactions  . Lipitor [Atorvastatin]     Elevated cpk   . Mobic [Meloxicam] Other (See Comments)    edema  . Nsaids Other (See Comments)    dyspepsia    Current Problems (verified) has ASCAD s/p CABG (1992); Chronic systolic CHF (EF 16%); Automatic implantable cardioverter-defibrillator in situ; Essential hypertension; Other abnormal glucose; Vitamin D deficiency; Obstructive chronic bronchitis without exacerbation ([place]); Hyperlipidemia; Medication management; GERD ; Hyperkalemia (Hyporeninemic HypoAldosteronism); OAB (overactive bladder); Neurodermatitis; Falls; Chronic combined systolic and diastolic congestive heart failure (HCC); and  Sinus node dysfunction (HCC) on their problem list.  Screening Tests Immunization History  Administered Date(s) Administered  . Influenza, High Dose Seasonal PF 12/07/2013, 01/04/2015  . Influenza-Unspecified 01/01/2013  . Pneumococcal Conjugate-13 07/30/2016  . Pneumococcal Polysaccharide-23 07/02/2001, 06/01/2013  . Td 10/15/2015  . Tdap 03/04/2003   Preventative care: Last colonoscopy: 2009  Influenza: 2018 - at pharmacy Pneumonia 23: 2015 TDAP: 2005 Prenvar: 2018 Shingles vaccine: declines  Names of Other Physician/Practitioners you currently use: 1. [facility] here for primary care 2. Dr. Talbert Forest, eye doctor, last visit 2018 3. , dentist, last visit 2018  Patient Care Team: Unk Pinto, MD as PCP - General (Internal Medicine) Unk Pinto, MD (Internal Medicine) Darleen Crocker, MD as Consulting Physician (Ophthalmology) Deboraha Sprang, MD as Consulting Physician (Cardiology)  Surgical: He  has a past surgical history that includes Cardiac catheterization (10/14/2004); Coronary artery bypass graft (1992); Knee surgery; Cardiac defibrillator placement (10/2008); Inguinal hernia repair; Cardiac catheterization (2006); NUCLEAR STRESS TEST (2010); and PPM GENERATOR CHANGEOUT (N/A, 08/25/2016). Family His family history includes Breast cancer in his sister; Cancer in his mother; Hypertension in his father; Stroke in his father. Social history  He reports that he quit smoking about 35 years ago. He has a 15.00 pack-year smoking history. he has never used smokeless tobacco. He reports that he does not drink alcohol or use drugs.  MEDICARE WELLNESS OBJECTIVES: Physical activity: Current Exercise Habits: The patient does not participate in regular exercise at present, Exercise limited by: orthopedic condition(s) Cardiac risk factors: Cardiac Risk Factors include: advanced age (>26men, >56 women);hypertension;male  gender;dyslipidemia;sedentary lifestyle;smoking/ tobacco exposure Depression/mood screen:   Depression screen [facility] 2/9 03/17/2017  Decreased Interest 0  Down, Depressed, Hopeless 0  PHQ - 2 Score 0    ADLs:  In your present state of health, do you have any difficulty performing the following activities: 03/17/2017 11/19/2016  Hearing? Y N  Comment Wears bilateral hearing aids -  Vision? N N  Difficulty concentrating or making decisions? N N  Walking or climbing stairs? Y N  Comment Uses walker, has worked with PT -  Dressing or bathing? Y N  Comment Has home helper -  Doing errands, shopping? Y N  Comment Does not drive; wife drives, son/daughter drive as well -  Conservation officer, nature and eating ? Y -  Comment Family prepares, can eat well -  Using the Toilet? N -  In the past six months, have you accidently leaked urine? N -  Do you have problems with loss of bowel control? N -  Managing your Medications?  Y -  Comment Wife or family manages -  Managing your Finances? Y -  Comment Wife helps -  Housekeeping or managing your Housekeeping? Y -  Comment Family members -  Some recent data might be hidden     Cognitive Testing  Alert? Yes  Normal Appearance?Yes  Oriented to person? Yes  Place? Yes   Time? Yes  Recall of three objects?  Yes  Can perform simple calculations? Yes  Displays appropriate judgment?Yes  Can read the correct time from a watch face?Yes  EOL planning: Does Patient Have a Medical Advance Directive?: Yes Type of Advance Directive: Healthcare Power of Attorney, Living will Does patient want to make changes to medical advance directive?: No - Patient declined Copy of [place] in Chart?: No - copy requested   Objective:   Today's Vitals   03/17/17 1141  BP: 104/64  Pulse: 63  Temp: (!) 97.5 F (36.4 C)  SpO2: 98%  Weight: 142 lb (64.4 kg)  Height: 5\' 11"  (1.803 m)   Body mass index is 19.8 kg/m.  General appearance: alert, frail, no  distress, WD/WN, male HEENT: normocephalic, sclerae anicteric, TMs pearly, nares patent, no discharge or erythema, pharynx normal Oral cavity: MMM, no lesions Neck: supple, no lymphadenopathy, no thyromegaly, no masses Heart: Heart sounds distant, rhythm irregular, normal, no distinct murmurs, distal pulses 2+ Lungs: CTA bilaterally, no wheezes, rhonchi, or rales Abdomen: +bs, soft, non tender, non distended, no masses, no hepatomegaly, no splenomegaly Musculoskeletal: nontender, no swelling, no obvious deformity, ambulates slowly with walker Extremities: no edema, no cyanosis, no clubbing Pulses: 2+ symmetric, upper and lower extremities, normal cap refill Neurological: alert, oriented x 3, CN2-12 intact, strength reduced but symmetrical upper extremities and lower extremities, sensation normal throughout,  gait slow with walker Psychiatric: normal affect, behavior normal, pleasant   Medicare Attestation I have personally reviewed: The patient's medical and social history Their use of alcohol, tobacco or illicit drugs Their current medications and supplements The patient's functional ability including ADLs,fall risks, home safety risks, cognitive, and hearing and visual impairment Diet and physical activities Evidence for depression or mood disorders  The patient's weight, height, BMI, and visual acuity have been recorded in the chart.  I have made referrals, counseling, and provided education to the patient based on review of the above and I have provided the patient with a written personalized care plan for preventive services.     Richard Ribas, NP   03/17/2017

## 2017-03-17 ENCOUNTER — Ambulatory Visit (INDEPENDENT_AMBULATORY_CARE_PROVIDER_SITE_OTHER): Payer: Medicare Other | Admitting: Adult Health

## 2017-03-17 ENCOUNTER — Encounter: Payer: Self-pay | Admitting: Adult Health

## 2017-03-17 VITALS — BP 104/64 | HR 63 | Temp 97.5°F | Ht 71.0 in | Wt 142.0 lb

## 2017-03-17 DIAGNOSIS — L280 Lichen simplex chronicus: Secondary | ICD-10-CM

## 2017-03-17 DIAGNOSIS — R6889 Other general symptoms and signs: Secondary | ICD-10-CM

## 2017-03-17 DIAGNOSIS — N3281 Overactive bladder: Secondary | ICD-10-CM

## 2017-03-17 DIAGNOSIS — W19XXXS Unspecified fall, sequela: Secondary | ICD-10-CM

## 2017-03-17 DIAGNOSIS — J4489 Other specified chronic obstructive pulmonary disease: Secondary | ICD-10-CM

## 2017-03-17 DIAGNOSIS — Z0001 Encounter for general adult medical examination with abnormal findings: Secondary | ICD-10-CM

## 2017-03-17 DIAGNOSIS — I5042 Chronic combined systolic (congestive) and diastolic (congestive) heart failure: Secondary | ICD-10-CM

## 2017-03-17 DIAGNOSIS — Z79899 Other long term (current) drug therapy: Secondary | ICD-10-CM | POA: Diagnosis not present

## 2017-03-17 DIAGNOSIS — E559 Vitamin D deficiency, unspecified: Secondary | ICD-10-CM

## 2017-03-17 DIAGNOSIS — E782 Mixed hyperlipidemia: Secondary | ICD-10-CM

## 2017-03-17 DIAGNOSIS — R7303 Prediabetes: Secondary | ICD-10-CM

## 2017-03-17 DIAGNOSIS — Z95810 Presence of automatic (implantable) cardiac defibrillator: Secondary | ICD-10-CM | POA: Diagnosis not present

## 2017-03-17 DIAGNOSIS — R7309 Other abnormal glucose: Secondary | ICD-10-CM

## 2017-03-17 DIAGNOSIS — K219 Gastro-esophageal reflux disease without esophagitis: Secondary | ICD-10-CM

## 2017-03-17 DIAGNOSIS — I495 Sick sinus syndrome: Secondary | ICD-10-CM

## 2017-03-17 DIAGNOSIS — I10 Essential (primary) hypertension: Secondary | ICD-10-CM

## 2017-03-17 DIAGNOSIS — J449 Chronic obstructive pulmonary disease, unspecified: Secondary | ICD-10-CM | POA: Diagnosis not present

## 2017-03-17 DIAGNOSIS — I255 Ischemic cardiomyopathy: Secondary | ICD-10-CM

## 2017-03-17 DIAGNOSIS — E875 Hyperkalemia: Secondary | ICD-10-CM

## 2017-03-17 DIAGNOSIS — Z0000 Encounter for general adult medical examination without abnormal findings: Secondary | ICD-10-CM

## 2017-03-18 LAB — HEPATIC FUNCTION PANEL
AG Ratio: 1.6 (calc) (ref 1.0–2.5)
ALKALINE PHOSPHATASE (APISO): 86 U/L (ref 40–115)
ALT: 9 U/L (ref 9–46)
AST: 14 U/L (ref 10–35)
Albumin: 4 g/dL (ref 3.6–5.1)
BILIRUBIN DIRECT: 0.1 mg/dL (ref 0.0–0.2)
BILIRUBIN TOTAL: 0.5 mg/dL (ref 0.2–1.2)
Globulin: 2.5 g/dL (calc) (ref 1.9–3.7)
Indirect Bilirubin: 0.4 mg/dL (calc) (ref 0.2–1.2)
Total Protein: 6.5 g/dL (ref 6.1–8.1)

## 2017-03-18 LAB — CBC WITH DIFFERENTIAL/PLATELET
BASOS ABS: 63 {cells}/uL (ref 0–200)
Basophils Relative: 1.1 %
EOS PCT: 2.6 %
Eosinophils Absolute: 148 cells/uL (ref 15–500)
HEMATOCRIT: 34 % — AB (ref 38.5–50.0)
Hemoglobin: 11.5 g/dL — ABNORMAL LOW (ref 13.2–17.1)
Lymphs Abs: 1191 cells/uL (ref 850–3900)
MCH: 32 pg (ref 27.0–33.0)
MCHC: 33.8 g/dL (ref 32.0–36.0)
MCV: 94.7 fL (ref 80.0–100.0)
MPV: 11.7 fL (ref 7.5–12.5)
Monocytes Relative: 9.8 %
NEUTROS PCT: 65.6 %
Neutro Abs: 3739 cells/uL (ref 1500–7800)
PLATELETS: 221 10*3/uL (ref 140–400)
RBC: 3.59 10*6/uL — ABNORMAL LOW (ref 4.20–5.80)
RDW: 12.4 % (ref 11.0–15.0)
TOTAL LYMPHOCYTE: 20.9 %
WBC mixed population: 559 cells/uL (ref 200–950)
WBC: 5.7 10*3/uL (ref 3.8–10.8)

## 2017-03-18 LAB — LIPID PANEL
Cholesterol: 177 mg/dL (ref <200)
HDL: 76 mg/dL (ref >40)
LDL Cholesterol (Calc): 83 mg/dL (calc)
Non-HDL Cholesterol (Calc): 101 mg/dL (calc) (ref <130)
Total CHOL/HDL Ratio: 2.3 (calc) (ref <5.0)
Triglycerides: 87 mg/dL (ref <150)

## 2017-03-18 LAB — BASIC METABOLIC PANEL WITH GFR
BUN/Creatinine Ratio: 26 (calc) — ABNORMAL HIGH (ref 6–22)
BUN: 27 mg/dL — ABNORMAL HIGH (ref 7–25)
CHLORIDE: 103 mmol/L (ref 98–110)
CO2: 28 mmol/L (ref 20–32)
Calcium: 9.8 mg/dL (ref 8.6–10.3)
Creat: 1.02 mg/dL (ref 0.70–1.11)
GFR, EST AFRICAN AMERICAN: 76 mL/min/{1.73_m2} (ref >=60)
GFR, EST NON AFRICAN AMERICAN: 66 mL/min/{1.73_m2} (ref >=60)
Glucose, Bld: 96 mg/dL (ref 65–99)
Potassium: 5.3 mmol/L (ref 3.5–5.3)
SODIUM: 138 mmol/L (ref 135–146)

## 2017-03-18 LAB — VITAMIN D 25 HYDROXY (VIT D DEFICIENCY, FRACTURES): Vit D, 25-Hydroxy: 98 ng/mL (ref 30–100)

## 2017-03-18 LAB — TSH: TSH: 2.75 m[IU]/L (ref 0.40–4.50)

## 2017-03-31 ENCOUNTER — Encounter: Payer: Self-pay | Admitting: Podiatry

## 2017-03-31 ENCOUNTER — Ambulatory Visit (INDEPENDENT_AMBULATORY_CARE_PROVIDER_SITE_OTHER): Payer: Medicare Other | Admitting: Podiatry

## 2017-03-31 DIAGNOSIS — I739 Peripheral vascular disease, unspecified: Secondary | ICD-10-CM

## 2017-03-31 DIAGNOSIS — B351 Tinea unguium: Secondary | ICD-10-CM | POA: Diagnosis not present

## 2017-03-31 NOTE — Progress Notes (Signed)
Subjective: 84 y.o. year old male patient presents using walker accompanied by his wife complaining of painful nails. He has a hard of hearing.  Objective: DermatolUlcogic: Thick yellow deformed nails x 10. Ulcerating digital corn 3rd left with intradermal bleeding. No associated edema or erythema noted. Vascular: Pedal pulses are not palpable. Mild forefoot edema on left. Orthopedic: Contracted lesser digits 3rd bilateral L>R. Ulcerative on 3rd left. Neurologic: All epicritic and tactile sensations grossly intact.  Assessment: Dystrophic mycotic nails x 10. Ulcerating digital corn 3rd left. PVD.  Treatment: All mycotic nails, corns, calluses debrided.  3rd left dressed with Amerigel ointment. Informed to his wife to change dressing daily x 3 weeks. Return in 3 months or sooner needed.

## 2017-03-31 NOTE — Patient Instructions (Signed)
Seen for hypertrophic nails. And follow up on ulcerating toe 4th left. All nails debrided. Lesions debrided and Amerigel dressing applied. Change dressing daily for the next 3 weeks. Return in 3 months or sooner if needed.

## 2017-05-17 ENCOUNTER — Encounter: Payer: Self-pay | Admitting: Physician Assistant

## 2017-05-26 ENCOUNTER — Ambulatory Visit: Payer: Self-pay | Admitting: Internal Medicine

## 2017-05-28 ENCOUNTER — Telehealth: Payer: Self-pay

## 2017-05-28 ENCOUNTER — Ambulatory Visit (INDEPENDENT_AMBULATORY_CARE_PROVIDER_SITE_OTHER): Payer: Medicare Other | Admitting: *Deleted

## 2017-05-28 DIAGNOSIS — Z950 Presence of cardiac pacemaker: Secondary | ICD-10-CM

## 2017-05-28 DIAGNOSIS — I255 Ischemic cardiomyopathy: Secondary | ICD-10-CM

## 2017-05-28 NOTE — Telephone Encounter (Signed)
Spoke with patient and relayed Dr. Bonita Quin recommendations that patient have a cxray and be seen by SK at next available. I offered an appointment for 4/15, the next day SK is back in the office. Patient was unsure of when he would be able to come as his wife would have to drive him and she is in cardiac rehab s/p CABG. DC number given to patient for wife to call back for scheduling.

## 2017-05-28 NOTE — Telephone Encounter (Signed)
Reviewed transmission with JA. Recommendations are for cxray and OV with SK at next available.

## 2017-05-28 NOTE — Telephone Encounter (Signed)
Spoke with patient after reviewing remote interrogation with RA polarity switch. Patient states that he has been feeling well with no changes in energy level. No new feelings of weakness or fatigue. I explained that I will review remote check with physician and call back with further recommendations.

## 2017-05-29 NOTE — Progress Notes (Signed)
Remote pacemaker transmission.   

## 2017-05-29 NOTE — Telephone Encounter (Signed)
Spoke w/ pt wife and informed her that pt will need to se MD and have a chest x-ray done. Informed her that the Device Tech RN will call her back once she has the instructions for her to complete the chest x-ray. Pt wife agreed to an appt on Monday 06-15-17 at 3:30 PM with MD. Please call Sarah at [phone] to discuss chest x-ray.

## 2017-05-29 NOTE — Telephone Encounter (Signed)
Spoke with Ms. Piano. She is agreeable to CXR for Ms. Peckham at [facility]. She is planning to come 06/02/17 in the morning. Order placed.

## 2017-06-01 ENCOUNTER — Encounter: Payer: Self-pay | Admitting: Cardiology

## 2017-06-01 DIAGNOSIS — H40013 Open angle with borderline findings, low risk, bilateral: Secondary | ICD-10-CM | POA: Diagnosis not present

## 2017-06-02 ENCOUNTER — Ambulatory Visit (HOSPITAL_COMMUNITY)
Admission: RE | Admit: 2017-06-02 | Discharge: 2017-06-02 | Disposition: A | Discharge status: Home / Self Care | Payer: Medicare Other | Source: Ambulatory Visit | Attending: Internal Medicine | Admitting: Internal Medicine

## 2017-06-02 DIAGNOSIS — I700 Atherosclerosis of aorta: Secondary | ICD-10-CM | POA: Diagnosis not present

## 2017-06-02 DIAGNOSIS — Z09 Encounter for follow-up examination after completed treatment for conditions other than malignant neoplasm: Secondary | ICD-10-CM | POA: Insufficient documentation

## 2017-06-02 DIAGNOSIS — R0989 Other specified symptoms and signs involving the circulatory and respiratory systems: Secondary | ICD-10-CM | POA: Diagnosis not present

## 2017-06-02 DIAGNOSIS — Z950 Presence of cardiac pacemaker: Secondary | ICD-10-CM | POA: Diagnosis not present

## 2017-06-15 ENCOUNTER — Encounter: Payer: Self-pay | Admitting: Internal Medicine

## 2017-06-15 ENCOUNTER — Encounter: Payer: Medicare Other | Admitting: Internal Medicine

## 2017-06-15 ENCOUNTER — Ambulatory Visit (INDEPENDENT_AMBULATORY_CARE_PROVIDER_SITE_OTHER): Payer: Medicare Other | Admitting: Internal Medicine

## 2017-06-15 VITALS — BP 110/62 | HR 68 | Ht 72.0 in | Wt 144.0 lb

## 2017-06-15 VITALS — BP 96/46 | HR 64 | Temp 97.5°F | Resp 16 | Ht 72.0 in | Wt 144.2 lb

## 2017-06-15 DIAGNOSIS — I495 Sick sinus syndrome: Secondary | ICD-10-CM | POA: Diagnosis not present

## 2017-06-15 DIAGNOSIS — I255 Ischemic cardiomyopathy: Secondary | ICD-10-CM

## 2017-06-15 DIAGNOSIS — Z79899 Other long term (current) drug therapy: Secondary | ICD-10-CM

## 2017-06-15 DIAGNOSIS — E559 Vitamin D deficiency, unspecified: Secondary | ICD-10-CM

## 2017-06-15 DIAGNOSIS — Z950 Presence of cardiac pacemaker: Secondary | ICD-10-CM

## 2017-06-15 DIAGNOSIS — E782 Mixed hyperlipidemia: Secondary | ICD-10-CM | POA: Diagnosis not present

## 2017-06-15 DIAGNOSIS — R300 Dysuria: Secondary | ICD-10-CM

## 2017-06-15 DIAGNOSIS — R7309 Other abnormal glucose: Secondary | ICD-10-CM

## 2017-06-15 DIAGNOSIS — I951 Orthostatic hypotension: Secondary | ICD-10-CM

## 2017-06-15 DIAGNOSIS — I10 Essential (primary) hypertension: Secondary | ICD-10-CM

## 2017-06-15 NOTE — Progress Notes (Signed)
Patient Care Team: Unk Pinto, MD as PCP - General (Internal Medicine) Unk Pinto, MD (Internal Medicine) Darleen Crocker, MD as Consulting Physician (Ophthalmology) Deboraha Sprang, MD as Consulting Physician (Cardiology)   HPI  Richard Moody is a 84 y.o. male seen in followup for ICD implanted in 2010. He has sinus node dysfunction and is atrial pacing.  Underwent ICD downgrade 6/18.  He has a history of ischemic heart disease with prior bypass surgery depressed left ventricular function with EFof 32%. He has  chronic systolic heart failure but has done relatively well.  Echo 7/16 EF 35-40%       Date TSH LFTs Cr  8/17  3.8 13    11  /17  4.26 15    5/18 3.09 11 1.2  9/18 3.24 12 1.12  1/19 2.75 9    He walks with a walker but has not noted any change in exercise tolerance.  His wife however has thought that his exercise tolerance is worse.  Notably, he underwent polarity switch   12/18  associated with significant noise on his atrial lead and increase his ventricular pacing  Denies edema or chest pain.    Past Medical History:  Diagnosis Date  . Ganglion cyst of wrist    LEFT WRIST  . GERD (gastroesophageal reflux disease)   . Hx of CABG 1992  . Hyperlipidemia   . ICD (implantable cardiac defibrillator) in place August 2010  . Ischemic cardiomyopathy    EF 32%  . MI, old    INFERIOR LATERAL  . Normal nuclear stress test June 2010   No ischemia. EF 32%  . OA (osteoarthritis) of knee   . Pre-diabetes   . S/P cardiac cath 2006   Occluded SVG to OM. Managed medically  . Vitamin D deficiency     Past Surgical History:  Procedure Laterality Date  . CARDIAC CATHETERIZATION  10/14/2004   THERE IS POSTERIOR LATERAL HYPOKINESIA. EF 30-35%  . CARDIAC CATHETERIZATION  2006   OCCLUDED SVG TO OM  . CARDIAC DEFIBRILLATOR PLACEMENT  10/2008  . CORONARY ARTERY BYPASS GRAFT  1992  . INGUINAL HERNIA REPAIR    . KNEE SURGERY     RIGHT KNEE  . NUCLEAR  STRESS TEST  2010   EF 32%. No ischemia  . PPM GENERATOR CHANGEOUT N/A 08/25/2016   Procedure: PPM Generator Changeout;  Surgeon: Deboraha Sprang, MD;  Location: [facility];  Service: Cardiovascular;  Laterality: N/A;    Current Outpatient Medications  Medication Sig Dispense Refill  . acetaminophen (TYLENOL) 500 MG tablet Take 500 mg by mouth 2 (two) times daily as needed for moderate pain.     Marland Kitchen amiodarone (PACERONE) 200 MG tablet Take 0.5 tablets (100 mg total) by mouth daily. 45 tablet 3  . Ascorbic Acid (VITAMIN C) 1000 MG tablet Take 1,000 mg by mouth daily.    Marland Kitchen aspirin 81 MG tablet Take 81 mg by mouth every Monday, Wednesday, and Friday.     . Calcium Carb-Cholecalciferol (CALCIUM 600 + D PO) Take 1 tablet by mouth daily.    . Cholecalciferol (VITAMIN D3) 5000 UNITS CAPS Take 10,000 Units by mouth daily.     . diphenhydrAMINE (BENADRYL) 25 MG tablet Take 25 mg by mouth at bedtime.     . docusate sodium (COLACE) 100 MG capsule Take 100 mg by mouth daily as needed for mild constipation.    . fexofenadine (ALLEGRA) 180 MG tablet Take 180 mg  by mouth daily.    . halobetasol (ULTRAVATE) 0.05 % cream Apply 1 application topically daily as needed (itching).    . Magnesium 250 MG TABS Take 250 mg by mouth 2 (two) times daily.     . metoprolol succinate (TOPROL-XL) 25 MG 24 hr tablet Take 0.5 tablets (12.5 mg total) by mouth daily with supper. 45 tablet 1  . Omega-3 Fatty Acids (FISH OIL PO) Take 1 capsule by mouth at bedtime.    [name] (SYSTANE OP) Apply 1 drop to eye daily as needed (dry eyes).    . polyethylene glycol (MIRALAX / GLYCOLAX) packet Take 17 g by mouth as needed for moderate constipation.      No current facility-administered medications for this visit.     Allergies  Allergen Reactions  . Lipitor [Atorvastatin]     Elevated cpk   . Mobic [Meloxicam] Other (See Comments)    edema  . Nsaids Other (See Comments)    dyspepsia    Review  of Systems negative except from HPI and PMH  Physical Exam BP 110/62   Pulse 68   Ht 6' (1.829 m)   Wt 144 lb (65.3 kg)   SpO2 96%   BMI 19.53 kg/m  Well developed and nourished in no acute distress HENT normal Neck supple with JVP-flat Clear Regular rate and rhythm, no murmurs or gallops Abd-soft with active BS No Clubbing cyanosis edema Skin-warm and dry A & Oriented  Grossly normal sensory and motor function   ECG  Atrial pacing at 62 with intrinsic conduction 36/10/43    Assessment and  Plan  Ischemic cardiomyopathy    Congestive heart failure-chronic-systolic     Implantable  defibrillator-[employer]  The patient's device was interrogated.  The information was reviewed. No changes were made in the programming.    PVCs  Sinus bradycardia  1AVB  Atrial lead fracture   First-degree AV block is stable.  Atrial lead fracture has been associated with a significant increase in his ventricular pacing burden we have addressed this by decreasing atrial sensitivity from 0.45--4.0.  Hopefully, the change in exercise tolerance identified by his wife will be remediated by the change in programming.  The patient himself is not so aware of the change.  We will plan to review this again in a couple of months.  Without symptoms of ischemia  ,Euvolemic continue current meds  We spent more than 50% of our >25 min visit in face to face counseling regarding the above

## 2017-06-15 NOTE — Progress Notes (Signed)
This very nice 84 y.o. MWM presents for 3 month follow up with HTN, HLD, Pre-Diabetes and Vitamin D Deficiency.  Wife whispers out of earshot that his Dementia is getting worse.      Patient is treated for HTN (2003) & CABG in 1992. Today's BP rechecked at 96/46 by nurse and rechecked  Sitting BP 108/53   P 77 and Standing BP 114/73   P 68  by myself.   Patient had a PM /AICD in 2010 changed out in June 2018 to a PPM. Patient has had no complaints of any cardiac type chest pain, palpitations, dyspnea / orthopnea / PND, dizziness, claudication, or dependent edema.     Hyperlipidemia is controlled with diet & meds. Patient denies myalgias or other med SE's. Last Lipids were at goal: Lab Results  Component Value Date   CHOL 177 03/17/2017   HDL 76 03/17/2017   LDLCALC 83 03/17/2017   TRIG 87 03/17/2017   CHOLHDL 2.3 03/17/2017      Also, the patient has history of PreDiabetes (A1c 5.8%/2011) and has lost about 20# since then with Aic monitoring WNL. He has had no symptoms of reactive hypoglycemia, diabetic polys, paresthesias or visual blurring.  Last A1c was Normal & at goal: Lab Results  Component Value Date   HGBA1C 5.5 11/19/2016      Further, the patient also has history of Vitamin D Deficiency ("22"/2008) and supplements vitamin D without any suspected side-effects. Last vitamin D was at goal: Lab Results  Component Value Date   VD25OH 98 03/17/2017   Current Outpatient Medications on File Prior to Visit  Medication Sig  . acetaminophen (TYLENOL) 500 MG tablet Take 500 mg by mouth 2 (two) times daily as needed for moderate pain.   Marland Kitchen amiodarone (PACERONE) 200 MG tablet Take 0.5 tablets (100 mg total) by mouth daily.  . Ascorbic Acid (VITAMIN C) 1000 MG tablet Take 1,000 mg by mouth daily.  Marland Kitchen aspirin 81 MG tablet Take 81 mg by mouth every Monday, Wednesday, and Friday.   . Calcium Carb-Cholecalciferol (CALCIUM 600 + D PO) Take 1 tablet by mouth daily.  . Cholecalciferol (VITAMIN  D3) 5000 UNITS CAPS Take 10,000 Units by mouth daily.   . diphenhydrAMINE (BENADRYL) 25 MG tablet Take 25 mg by mouth at bedtime.   . docusate sodium (COLACE) 100 MG capsule Take 100 mg by mouth daily as needed for mild constipation.  . fexofenadine (ALLEGRA) 180 MG tablet Take 180 mg by mouth daily.  . halobetasol (ULTRAVATE) 0.05 % cream Apply 1 application topically daily as needed (itching).  . Magnesium 250 MG TABS Take 250 mg by mouth 2 (two) times daily.   . metoprolol succinate (TOPROL-XL) 25 MG 24 hr tablet Take 0.5 tablets (12.5 mg total) by mouth daily with supper.  . Omega-3 Fatty Acids (FISH OIL PO) Take 1 capsule by mouth at bedtime.  [name] (SYSTANE OP) Apply 1 drop to eye daily as needed (dry eyes).  . polyethylene glycol (MIRALAX / GLYCOLAX) packet Take 17 g by mouth as needed for moderate constipation.    No current facility-administered medications on file prior to visit.    Allergies  Allergen Reactions  . Lipitor [Atorvastatin]     Elevated cpk   . Mobic [Meloxicam] Other (See Comments)    edema  . Nsaids Other (See Comments)    dyspepsia   PMHx:   Past Medical History:  Diagnosis Date  . Ganglion cyst  of wrist    LEFT WRIST  . GERD (gastroesophageal reflux disease)   . Hx of CABG 1992  . Hyperlipidemia   . ICD (implantable cardiac defibrillator) in place August 2010  . Ischemic cardiomyopathy    EF 32%  . MI, old    INFERIOR LATERAL  . Normal nuclear stress test June 2010   No ischemia. EF 32%  . OA (osteoarthritis) of knee   . Pre-diabetes   . S/P cardiac cath 2006   Occluded SVG to OM. Managed medically  . Vitamin D deficiency    Immunization History  Administered Date(s) Administered  . Influenza, High Dose Seasonal PF 12/07/2013, 01/04/2015  . Influenza-Unspecified 01/01/2013  . Pneumococcal Conjugate-13 07/30/2016  . Pneumococcal Polysaccharide-23 07/02/2001, 06/01/2013  . Td 10/15/2015  . Tdap 03/04/2003   Past  Surgical History:  Procedure Laterality Date  . CARDIAC CATHETERIZATION  10/14/2004   THERE IS POSTERIOR LATERAL HYPOKINESIA. EF 30-35%  . CARDIAC CATHETERIZATION  2006   OCCLUDED SVG TO OM  . CARDIAC DEFIBRILLATOR PLACEMENT  10/2008  . CORONARY ARTERY BYPASS GRAFT  1992  . INGUINAL HERNIA REPAIR    . KNEE SURGERY     RIGHT KNEE  . NUCLEAR STRESS TEST  2010   EF 32%. No ischemia  . PPM GENERATOR CHANGEOUT N/A 08/25/2016   Procedure: PPM Generator Changeout;  Surgeon: Deboraha Sprang, MD;  Location: [facility];  Service: Cardiovascular;  Laterality: N/A;   FHx:    Reviewed / unchanged  SHx:    Reviewed / unchanged   Systems Review:  Constitutional: Denies fever, chills, wt changes, headaches, insomnia, fatigue, night sweats, change in appetite. Eyes: Denies redness, blurred vision, diplopia, discharge, itchy, watery eyes.  ENT: Denies discharge, congestion, post nasal drip, epistaxis, sore throat, earache, hearing loss, dental pain, tinnitus, vertigo, sinus pain, snoring.  CV: Denies chest pain, palpitations, irregular heartbeat, syncope, dyspnea, diaphoresis, orthopnea, PND, claudication or edema. Respiratory: denies cough, dyspnea, DOE, pleurisy, hoarseness, laryngitis, wheezing.  Gastrointestinal: Denies dysphagia, odynophagia, heartburn, reflux, water brash, abdominal pain or cramps, nausea, vomiting, bloating, diarrhea, constipation, hematemesis, melena, hematochezia  or hemorrhoids. Genitourinary: Denies dysuria, frequency, urgency, nocturia, hesitancy, discharge, hematuria or flank pain. Musculoskeletal: Denies arthralgias, myalgias, stiffness, jt. swelling, pain, limping or strain/sprain.  Skin: Denies pruritus, rash, hives, warts, acne, eczema or change in skin lesion(s). Neuro: No weakness, tremor, incoordination, spasms, paresthesia or pain. Psychiatric: Denies confusion, memory loss or sensory loss. Endo: Denies change in weight, skin or hair change.  Heme/Lymph: No  excessive bleeding, bruising or enlarged lymph nodes.  Physical Exam  BP (!) 96/46   Pulse 64   Temp (!) 97.5 F (36.4 C)   Resp 16   Ht 6' (1.829 m)   Wt 144 lb 3.2 oz (65.4 kg)   BMI 19.56 kg/m   Postural Sitting BP 108/53   P 77 and Standing BP 114/73   P 68  Appears  chronically malnourished elderly thin white man in no distress.  Eyes: PERRLA, EOMs, conjunctiva no swelling or erythema. Sinuses: No frontal/maxillary tenderness ENT/Mouth: EAC's clear, TM's nl w/o erythema, bulging. Nares clear w/o erythema, swelling, exudates. Oropharynx clear without erythema or exudates. Oral hygiene is good. Tongue normal, non obstructing. Hearing intact.  Neck: Supple. Thyroid not palpable. Car 2+/2+ without bruits, nodes or JVD. Chest: Very barrel chested with increased AP diameter w/o rales, rhonchi, wheezing or stridor.  Cor: Heart sounds normal w/ regular rate and rhythm without sig. murmurs, gallops, clicks or rubs.  Peripheral pulses normal and equal  without edema.  Abdomen: Soft, scaphoid & bowel sounds normal. Non-tender w/o guarding, rebound, hernias, masses or organomegaly.  Lymphatics: Unremarkable.  Musculoskeletal: Generalized decrease in Muscle mass, power & tone with broad based gait supported with a walker.  Skin: Warm, dry without exposed rashes, lesions or ecchymosis apparent.  Neuro: Cranial nerves intact, reflexes equal bilaterally. Sensory-motor testing grossly intact. Tendon reflexes grossly intact.  Pysch: Alert & oriented x 3.  Insight and judgement limited.   Assessment and Plan:  1. Essential hypertension  - Continue medication, monitor blood pressure at home.  - Continue DASH diet.  Reminder to go to the ER if any CP,  SOB, nausea, dizziness, severe HA, changes vision/speech.  - CBC with Differential/Platelet - BASIC METABOLIC PANEL WITH GFR - Magnesium - TSH  2. Hyperlipidemia, mixed  - Continue diet/meds, exercise,& lifestyle modifications.  -  Continue monitor periodic cholesterol/liver & renal functions   - Hepatic function panel - Lipid panel - TSH  3. Abnormal glucose  - Continue diet, exercise, lifestyle modifications.  - Monitor appropriate labs.  - Hemoglobin A1c - Insulin, random  4. Vitamin D deficiency  - Continue supplementation.   - VITAMIN D 25 Hydroxy  5. Autonomic postural hypotension  - BASIC METABOLIC PANEL WITH GFR  6. Medication management  - CBC with Differential/Platelet - BASIC METABOLIC PANEL WITH GFR - Hepatic function panel - Magnesium - Lipid panel - TSH - Hemoglobin A1c - Insulin, random - VITAMIN D 25 Hydroxy   7. Dysuria - Urinalysis, Routine w reflex microscopic          Discussed  regular exercise, BP monitoring, weight control to achieve/maintain BMI less than 25 and discussed med and SE's. Recommended labs to assess and monitor clinical status with further disposition pending results of labs. Over 30 minutes of exam, counseling, chart review was performed.

## 2017-06-15 NOTE — Patient Instructions (Signed)

## 2017-06-15 NOTE — Patient Instructions (Signed)
Medication Instructions:  Your physician recommends that you continue on your current medications as directed. Please refer to the Current Medication list given to you today.  Labwork: None ordered.  Testing/Procedures: None ordered.  Follow-Up: Your physician recommends that you schedule a follow-up appointment in:   6 months with [name], NP   Remote monitoring is used to monitor your Pacemaker from home. This monitoring reduces the number of office visits required to check your device to one time per year. It allows [place] to keep an eye on the functioning of your device to ensure it is working properly. You are scheduled for a device check from home on 08/27/2017. You may send your transmission at any time that day. If you have a wireless device, the transmission will be sent automatically. After your physician reviews your transmission, you will receive a postcard with your next transmission date.   Any Other Special Instructions Will Be Listed Below (If Applicable).     If you need a refill on your cardiac medications before your next appointment, please call your pharmacy.

## 2017-06-16 LAB — CUP PACEART INCLINIC DEVICE CHECK
Battery Remaining Longevity: 152 mo
Brady Statistic AP VS Percent: 75.42 %
Brady Statistic AS VS Percent: 5.06 %
Brady Statistic RV Percent Paced: 19.9 %
Implantable Lead Implant Date: 20100805
Implantable Lead Location: 753859
Implantable Lead Model: 5076
Lead Channel Impedance Value: 342 Ohm
Lead Channel Impedance Value: 532 Ohm
Lead Channel Impedance Value: 684 Ohm
Lead Channel Pacing Threshold Amplitude: 0.875 V
Lead Channel Pacing Threshold Pulse Width: 0.4 ms
Lead Channel Sensing Intrinsic Amplitude: 1.875 mV
Lead Channel Sensing Intrinsic Amplitude: 10.625 mV
Lead Channel Sensing Intrinsic Amplitude: 2.125 mV
Lead Channel Setting Pacing Amplitude: 2.5 V
Lead Channel Setting Sensing Sensitivity: 2.8 mV
MDC IDC LEAD IMPLANT DT: 20100805
MDC IDC LEAD LOCATION: 753860
MDC IDC MSMT BATTERY VOLTAGE: 3.1 V
MDC IDC MSMT LEADCHNL RA PACING THRESHOLD AMPLITUDE: 0.875 V
MDC IDC MSMT LEADCHNL RA PACING THRESHOLD PULSEWIDTH: 0.4 ms
MDC IDC MSMT LEADCHNL RV IMPEDANCE VALUE: 323 Ohm
MDC IDC MSMT LEADCHNL RV SENSING INTR AMPL: 13.75 mV
MDC IDC PG IMPLANT DT: 20180625
MDC IDC SESS DTM: 20190415141729
MDC IDC SET LEADCHNL RA PACING AMPLITUDE: 1.75 V
MDC IDC SET LEADCHNL RV PACING PULSEWIDTH: 0.4 ms
MDC IDC STAT BRADY AP VP PERCENT: 16.25 %
MDC IDC STAT BRADY AS VP PERCENT: 3.25 %
MDC IDC STAT BRADY RA PERCENT PACED: 91.89 %

## 2017-06-16 LAB — CUP PACEART REMOTE DEVICE CHECK
Battery Remaining Longevity: 153 mo
Brady Statistic AP VS Percent: 61.16 %
Brady Statistic AS VP Percent: 5.74 %
Brady Statistic RA Percent Paced: 88.43 %
Implantable Lead Implant Date: 20100805
Implantable Lead Implant Date: 20100805
Implantable Lead Location: 753860
Implantable Lead Model: 6947
Lead Channel Impedance Value: 475 Ohm
Lead Channel Pacing Threshold Pulse Width: 0.4 ms
Lead Channel Pacing Threshold Pulse Width: 0.4 ms
Lead Channel Sensing Intrinsic Amplitude: 1.5 mV
Lead Channel Sensing Intrinsic Amplitude: 1.5 mV
Lead Channel Sensing Intrinsic Amplitude: 11 mV
Lead Channel Setting Pacing Pulse Width: 0.4 ms
MDC IDC LEAD LOCATION: 753859
MDC IDC MSMT BATTERY VOLTAGE: 3.12 V
MDC IDC MSMT LEADCHNL RA IMPEDANCE VALUE: 627 Ohm
MDC IDC MSMT LEADCHNL RA PACING THRESHOLD AMPLITUDE: 0.875 V
MDC IDC MSMT LEADCHNL RV IMPEDANCE VALUE: 323 Ohm
MDC IDC MSMT LEADCHNL RV IMPEDANCE VALUE: 342 Ohm
MDC IDC MSMT LEADCHNL RV PACING THRESHOLD AMPLITUDE: 1 V
MDC IDC MSMT LEADCHNL RV SENSING INTR AMPL: 11 mV
MDC IDC PG IMPLANT DT: 20180625
MDC IDC SESS DTM: 20190328053618
MDC IDC SET LEADCHNL RA PACING AMPLITUDE: 1.75 V
MDC IDC SET LEADCHNL RV PACING AMPLITUDE: 2.5 V
MDC IDC SET LEADCHNL RV SENSING SENSITIVITY: 2.8 mV
MDC IDC STAT BRADY AP VP PERCENT: 27.94 %
MDC IDC STAT BRADY AS VS PERCENT: 5.13 %
MDC IDC STAT BRADY RV PERCENT PACED: 34.18 %

## 2017-06-16 LAB — HEPATIC FUNCTION PANEL
AG RATIO: 1.6 (calc) (ref 1.0–2.5)
ALBUMIN MSPROF: 4.1 g/dL (ref 3.6–5.1)
ALKALINE PHOSPHATASE (APISO): 98 U/L (ref 40–115)
ALT: 13 U/L (ref 9–46)
AST: 17 U/L (ref 10–35)
Bilirubin, Direct: 0.1 mg/dL (ref 0.0–0.2)
Globulin: 2.5 g/dL (calc) (ref 1.9–3.7)
Indirect Bilirubin: 0.4 mg/dL (calc) (ref 0.2–1.2)
Total Bilirubin: 0.5 mg/dL (ref 0.2–1.2)
Total Protein: 6.6 g/dL (ref 6.1–8.1)

## 2017-06-16 LAB — LIPID PANEL
Cholesterol: 187 mg/dL (ref <200)
HDL: 59 mg/dL (ref >40)
LDL CHOLESTEROL (CALC): 108 mg/dL — AB
NON-HDL CHOLESTEROL (CALC): 128 mg/dL (ref <130)
TRIGLYCERIDES: 105 mg/dL (ref <150)
Total CHOL/HDL Ratio: 3.2 (calc) (ref <5.0)

## 2017-06-16 LAB — CBC WITH DIFFERENTIAL/PLATELET
Basophils Absolute: 50 cells/uL (ref 0–200)
Basophils Relative: 0.9 %
EOS ABS: 157 {cells}/uL (ref 15–500)
Eosinophils Relative: 2.8 %
HCT: 32.7 % — ABNORMAL LOW (ref 38.5–50.0)
Hemoglobin: 11.5 g/dL — ABNORMAL LOW (ref 13.2–17.1)
Lymphs Abs: 1036 cells/uL (ref 850–3900)
MCH: 32.9 pg (ref 27.0–33.0)
MCHC: 35.2 g/dL (ref 32.0–36.0)
MCV: 93.4 fL (ref 80.0–100.0)
MONOS PCT: 11.2 %
MPV: 11.1 fL (ref 7.5–12.5)
Neutro Abs: 3730 cells/uL (ref 1500–7800)
Neutrophils Relative %: 66.6 %
PLATELETS: 198 10*3/uL (ref 140–400)
RBC: 3.5 10*6/uL — ABNORMAL LOW (ref 4.20–5.80)
RDW: 12.2 % (ref 11.0–15.0)
TOTAL LYMPHOCYTE: 18.5 %
WBC: 5.6 10*3/uL (ref 3.8–10.8)
WBCMIX: 627 {cells}/uL (ref 200–950)

## 2017-06-16 LAB — URINALYSIS, ROUTINE W REFLEX MICROSCOPIC
Bilirubin Urine: NEGATIVE
Glucose, UA: NEGATIVE
Hgb urine dipstick: NEGATIVE
Ketones, ur: NEGATIVE
LEUKOCYTES UA: NEGATIVE
NITRITE: NEGATIVE
Protein, ur: NEGATIVE
SPECIFIC GRAVITY, URINE: 1.025 (ref 1.001–1.03)
pH: 6 (ref 5.0–8.0)

## 2017-06-16 LAB — MAGNESIUM: Magnesium: 2.6 mg/dL — ABNORMAL HIGH (ref 1.5–2.5)

## 2017-06-16 LAB — HEMOGLOBIN A1C
Hgb A1c MFr Bld: 5.7 % of total Hgb — ABNORMAL HIGH (ref <5.7)
Mean Plasma Glucose: 117 (calc)
eAG (mmol/L): 6.5 (calc)

## 2017-06-16 LAB — BASIC METABOLIC PANEL WITH GFR
BUN/Creatinine Ratio: 30 (calc) — ABNORMAL HIGH (ref 6–22)
BUN: 31 mg/dL — ABNORMAL HIGH (ref 7–25)
CO2: 30 mmol/L (ref 20–32)
Calcium: 9.4 mg/dL (ref 8.6–10.3)
Chloride: 103 mmol/L (ref 98–110)
Creat: 1.05 mg/dL (ref 0.70–1.11)
GFR, Est African American: 74 mL/min/{1.73_m2} (ref >=60)
GFR, Est Non African American: 64 mL/min/{1.73_m2} (ref >=60)
Glucose, Bld: 98 mg/dL (ref 65–99)
Potassium: 5 mmol/L (ref 3.5–5.3)
SODIUM: 138 mmol/L (ref 135–146)

## 2017-06-16 LAB — TSH: TSH: 3.56 mIU/L (ref 0.40–4.50)

## 2017-06-16 LAB — INSULIN, RANDOM: Insulin: 4.6 u[IU]/mL (ref 2.0–19.6)

## 2017-06-16 LAB — VITAMIN D 25 HYDROXY (VIT D DEFICIENCY, FRACTURES): Vit D, 25-Hydroxy: 95 ng/mL (ref 30–100)

## 2017-06-18 DIAGNOSIS — C060 Malignant neoplasm of cheek mucosa: Secondary | ICD-10-CM | POA: Diagnosis not present

## 2017-06-23 DIAGNOSIS — C060 Malignant neoplasm of cheek mucosa: Secondary | ICD-10-CM | POA: Diagnosis not present

## 2017-07-01 ENCOUNTER — Ambulatory Visit: Payer: Medicare Other | Admitting: Podiatry

## 2017-07-06 DIAGNOSIS — K1370 Unspecified lesions of oral mucosa: Secondary | ICD-10-CM | POA: Diagnosis not present

## 2017-07-06 DIAGNOSIS — C029 Malignant neoplasm of tongue, unspecified: Secondary | ICD-10-CM | POA: Diagnosis not present

## 2017-07-13 DIAGNOSIS — C060 Malignant neoplasm of cheek mucosa: Secondary | ICD-10-CM | POA: Diagnosis not present

## 2017-07-17 ENCOUNTER — Telehealth: Payer: Self-pay | Admitting: Internal Medicine

## 2017-07-17 NOTE — Telephone Encounter (Signed)
New message   Pt wife came in today about a surgery pt will be having from Dr. Hardie Shackleton an oral surgeon. He is supposed to have the procedure on Wednesday. Advised pt wife that the office needs to reach out to our office. Pt wife states that she will contact them first thing Monday morning. She just wanted Dr. Caryl Comes and nurse to know.

## 2017-07-21 NOTE — Telephone Encounter (Signed)
Spoke with pt's wife. She is concerned pt's oral surgeon has not sent in a request for surgical clearance. I advised her that decision should be made by his surgeon but if he needs clearance to please have them request it asap (surgery is scheduled for 5/22). Pt's wife states he is to go into see his surgeon today for pre-op work and will address this then. I also stated to her if his surgeon needs to speak with Dr Caryl Comes, that would be fine and to have him call the office.

## 2017-07-22 DIAGNOSIS — C060 Malignant neoplasm of cheek mucosa: Secondary | ICD-10-CM | POA: Diagnosis not present

## 2017-07-22 DIAGNOSIS — Z87891 Personal history of nicotine dependence: Secondary | ICD-10-CM | POA: Diagnosis not present

## 2017-07-22 DIAGNOSIS — Z95810 Presence of automatic (implantable) cardiac defibrillator: Secondary | ICD-10-CM | POA: Diagnosis not present

## 2017-07-22 DIAGNOSIS — C029 Malignant neoplasm of tongue, unspecified: Secondary | ICD-10-CM | POA: Diagnosis not present

## 2017-07-22 DIAGNOSIS — C021 Malignant neoplasm of border of tongue: Secondary | ICD-10-CM | POA: Diagnosis not present

## 2017-07-22 DIAGNOSIS — Z951 Presence of aortocoronary bypass graft: Secondary | ICD-10-CM | POA: Diagnosis not present

## 2017-08-27 ENCOUNTER — Encounter: Payer: Self-pay | Admitting: Cardiology

## 2017-08-27 ENCOUNTER — Ambulatory Visit (INDEPENDENT_AMBULATORY_CARE_PROVIDER_SITE_OTHER): Payer: Medicare Other | Admitting: *Deleted

## 2017-08-27 DIAGNOSIS — I495 Sick sinus syndrome: Secondary | ICD-10-CM | POA: Diagnosis not present

## 2017-08-27 NOTE — Progress Notes (Signed)
Remote pacemaker transmission.   

## 2017-08-28 LAB — CUP PACEART REMOTE DEVICE CHECK
Battery Voltage: 3.07 V
Brady Statistic AP VP Percent: 39.09 %
Brady Statistic AS VP Percent: 0.75 %
Brady Statistic RA Percent Paced: 98.74 %
Brady Statistic RV Percent Paced: 39.85 %
Date Time Interrogation Session: 20190627053141
Implantable Lead Implant Date: 20100805
Implantable Lead Implant Date: 20100805
Implantable Lead Location: 753860
Implantable Lead Model: 5076
Implantable Pulse Generator Implant Date: 20180625
Lead Channel Impedance Value: 323 Ohm
Lead Channel Impedance Value: 342 Ohm
Lead Channel Pacing Threshold Amplitude: 0.5 V
Lead Channel Pacing Threshold Pulse Width: 0.4 ms
Lead Channel Pacing Threshold Pulse Width: 0.4 ms
Lead Channel Sensing Intrinsic Amplitude: 11.375 mV
Lead Channel Setting Pacing Amplitude: 1.5 V
Lead Channel Setting Pacing Pulse Width: 0.4 ms
Lead Channel Setting Sensing Sensitivity: 2.8 mV
MDC IDC LEAD LOCATION: 753859
MDC IDC MSMT BATTERY REMAINING LONGEVITY: 159 mo
MDC IDC MSMT LEADCHNL RA IMPEDANCE VALUE: 3325 Ohm
MDC IDC MSMT LEADCHNL RA IMPEDANCE VALUE: 3325 Ohm
MDC IDC MSMT LEADCHNL RA SENSING INTR AMPL: 17.75 mV
MDC IDC MSMT LEADCHNL RA SENSING INTR AMPL: 17.75 mV
MDC IDC MSMT LEADCHNL RV PACING THRESHOLD AMPLITUDE: 1 V
MDC IDC MSMT LEADCHNL RV SENSING INTR AMPL: 11.375 mV
MDC IDC SET LEADCHNL RV PACING AMPLITUDE: 2.5 V
MDC IDC STAT BRADY AP VS PERCENT: 58.67 %
MDC IDC STAT BRADY AS VS PERCENT: 1.48 %

## 2017-09-14 NOTE — Progress Notes (Signed)
FOLLOW UP  Assessment and Plan:   ASCAD s/p CABG Control blood pressure, cholesterol, glucose, increase exercise. Followed by cardiology   Chronic combined systolic and diastolic congestive heart failure ([place]) Stable; continue monitoring and follow up with cardiology  Hypertension Well controlled with current medications  Monitor blood pressure at home; patient to call if consistently greater than 130/80 Continue DASH diet.   Reminder to go to the ER if any CP, SOB, nausea, dizziness, severe HA, changes vision/speech, left arm numbness and tingling and jaw pain.  Cholesterol Currently near goal by lifestyle; not on medications secondary to age and mild elevations only  Continue low cholesterol diet and exercise.  Check lipid panel.   Prediabetes Continue diet and exercise.  Perform daily foot/skin check, notify office of any concerning changes.  Check A1C  BMI 19 Continue to recommend diet heavy in fruits and veggies and low in animal meats, cheeses, and dairy products, appropriate calorie intake Discuss exercise recommendations routinely Continue to monitor weight at each visit   Vitamin D Def At goal at last visit; continue supplementation to maintain goal of 70-100 Defer Vit D level  Continue diet and meds as discussed. Further disposition pending results of labs. Discussed med's effects and SE's.   Over 30 minutes of exam, counseling, chart review, and critical decision making was performed.   Future Appointments  Date Time Provider [place]  11/26/2017  9:45 AM [facility]  12/17/2017  2:00 PM Unk Pinto, MD [facility] None    ----------------------------------------------------------------------------------------------------------------------  HPI 84 y.o. male  Presents accompanied for 3 month follow up on hypertension, cholesterol, prediabetes, weight and vitamin D deficiency. Patient had a PM /AICD in 2010  changed out in June 2018 to a PPM.  BMI is Body mass index is 19.12 kg/m.   He has a history of Combined Systolic and Diastolic CHF (ECHO 1884 EF 35-40%), denies dyspnea on exertion, orthopnea, paroxysmal nocturnal dyspnea and edema. Positive for none. Wt Readings from Last 3 Encounters:  09/15/17 141 lb (64 kg)  06/15/17 144 lb 3.2 oz (65.4 kg)  06/15/17 144 lb (65.3 kg)   His blood pressure has been controlled at home, today their BP is BP: (!) 100/54  He does not workout. He denies chest pain, shortness of breath, dizziness.   He is not on cholesterol medication secondary to age. His cholesterol is not at goal. The cholesterol last visit was:   Lab Results  Component Value Date   CHOL 187 06/15/2017   HDL 59 06/15/2017   LDLCALC 108 (H) 06/15/2017   TRIG 105 06/15/2017   CHOLHDL 3.2 06/15/2017    He has not been working on diet and exercise for prediabetes, and denies foot ulcerations, increased appetite, nausea, paresthesia of the feet, polydipsia, polyuria, visual disturbances, vomiting and weight loss. Last A1C in the office was:  Lab Results  Component Value Date   HGBA1C 5.7 (H) 06/15/2017   Patient is on Vitamin D supplement.   Lab Results  Component Value Date   VD25OH 95 06/15/2017        Current Medications:  Current Outpatient Medications on File Prior to Visit  Medication Sig  . acetaminophen (TYLENOL) 500 MG tablet Take 500 mg by mouth 2 (two) times daily as needed for moderate pain.   Marland Kitchen amiodarone (PACERONE) 200 MG tablet Take 0.5 tablets (100 mg total) by mouth daily.  . Ascorbic Acid (VITAMIN C) 1000 MG tablet Take 1,000 mg by mouth daily.  Marland Kitchen  aspirin 81 MG tablet Take 81 mg by mouth every Monday, Wednesday, and Friday.   . Calcium Carb-Cholecalciferol (CALCIUM 600 + D PO) Take 1 tablet by mouth daily.  . Cholecalciferol (VITAMIN D3) 5000 UNITS CAPS Take 10,000 Units by mouth daily.   . diphenhydrAMINE (BENADRYL) 25 MG tablet Take 25 mg by mouth at  bedtime.   . docusate sodium (COLACE) 100 MG capsule Take 100 mg by mouth daily as needed for mild constipation.  . fexofenadine (ALLEGRA) 180 MG tablet Take 180 mg by mouth daily.  . halobetasol (ULTRAVATE) 0.05 % cream Apply 1 application topically daily as needed (itching).  . Magnesium 250 MG TABS Take 250 mg by mouth 2 (two) times daily.   . metoprolol succinate (TOPROL-XL) 25 MG 24 hr tablet Take 0.5 tablets (12.5 mg total) by mouth daily with supper.  . Omega-3 Fatty Acids (FISH OIL PO) Take 1 capsule by mouth at bedtime.  [name] (SYSTANE OP) Apply 1 drop to eye daily as needed (dry eyes).  . polyethylene glycol (MIRALAX / GLYCOLAX) packet Take 17 g by mouth as needed for moderate constipation.    No current facility-administered medications on file prior to visit.      Allergies:  Allergies  Allergen Reactions  . Lipitor [Atorvastatin]     Elevated cpk   . Mobic [Meloxicam] Other (See Comments)    edema  . Nsaids Other (See Comments)    dyspepsia     Medical History:  Past Medical History:  Diagnosis Date  . Ganglion cyst of wrist    LEFT WRIST  . GERD (gastroesophageal reflux disease)   . Hx of CABG 1992  . Hyperlipidemia   . ICD (implantable cardiac defibrillator) in place August 2010  . Ischemic cardiomyopathy    EF 32%  . MI, old    INFERIOR LATERAL  . Normal nuclear stress test June 2010   No ischemia. EF 32%  . OA (osteoarthritis) of knee   . Pre-diabetes   . S/P cardiac cath 2006   Occluded SVG to OM. Managed medically  . Vitamin D deficiency    Family history- Reviewed and unchanged Social history- Reviewed and unchanged   Review of Systems:  Review of Systems  Constitutional: Negative for malaise/fatigue and weight loss.  HENT: Negative for hearing loss and tinnitus.   Eyes: Negative for blurred vision and double vision.  Respiratory: Negative for cough, shortness of breath and wheezing.   Cardiovascular: Negative for  chest pain, palpitations, orthopnea, claudication and leg swelling.  Gastrointestinal: Negative for abdominal pain, blood in stool, constipation, diarrhea, heartburn, melena, nausea and vomiting.  Genitourinary: Negative.   Musculoskeletal: Negative for joint pain and myalgias.  Skin: Negative for rash.  Neurological: Negative for dizziness, tingling, sensory change, weakness and headaches.  Endo/Heme/Allergies: Negative for polydipsia.  Psychiatric/Behavioral: Negative.   All other systems reviewed and are negative.   Physical Exam: BP (!) 100/54   Pulse 62   Temp 97.7 F (36.5 C)   Ht 6' (1.829 m)   Wt 141 lb (64 kg)   SpO2 98%   BMI 19.12 kg/m  Wt Readings from Last 3 Encounters:  09/15/17 141 lb (64 kg)  06/15/17 144 lb 3.2 oz (65.4 kg)  06/15/17 144 lb (65.3 kg)   General Appearance: Frail elder in no apparent distress. Eyes: PERRLA, EOMs, conjunctiva no swelling or erythema Sinuses: No Frontal/maxillary tenderness ENT/Mouth: Ext aud canals clear, TMs without erythema, bulging. No erythema, swelling, or exudate on  post pharynx.  Tonsils not swollen or erythematous. HOH with bilateral hearing aids.  Neck: Supple, thyroid normal.  Respiratory: Respiratory effort normal, BS equal bilaterally without rales, rhonchi, wheezing or stridor.  Cardio: RRR with no MRGs. Brisk peripheral pulses without edema.  Abdomen: Soft, + BS.  Non tender, no guarding, rebound, hernias, masses. Lymphatics: Non tender without lymphadenopathy.  Musculoskeletal: Full ROM, Symmetrical strength, somewhat weak sitting to stand, Ambulates slowly with walker  Skin: Warm, dry without rashes, lesions, ecchymosis.  Neuro: Cranial nerves intact. No cerebellar symptoms.  Psych: Awake and oriented X 3, normal affect, Insight and Judgment appropriate.   Izora Ribas, NP 3:46 PM [facility]

## 2017-09-15 ENCOUNTER — Encounter: Payer: Self-pay | Admitting: Adult Health

## 2017-09-15 ENCOUNTER — Ambulatory Visit (INDEPENDENT_AMBULATORY_CARE_PROVIDER_SITE_OTHER): Payer: Medicare Other | Admitting: Adult Health

## 2017-09-15 VITALS — BP 100/54 | HR 62 | Temp 97.7°F | Ht 72.0 in | Wt 141.0 lb

## 2017-09-15 DIAGNOSIS — R7309 Other abnormal glucose: Secondary | ICD-10-CM

## 2017-09-15 DIAGNOSIS — E875 Hyperkalemia: Secondary | ICD-10-CM | POA: Diagnosis not present

## 2017-09-15 DIAGNOSIS — E782 Mixed hyperlipidemia: Secondary | ICD-10-CM

## 2017-09-15 DIAGNOSIS — I10 Essential (primary) hypertension: Secondary | ICD-10-CM | POA: Diagnosis not present

## 2017-09-15 DIAGNOSIS — Z79899 Other long term (current) drug therapy: Secondary | ICD-10-CM | POA: Diagnosis not present

## 2017-09-15 DIAGNOSIS — E559 Vitamin D deficiency, unspecified: Secondary | ICD-10-CM | POA: Diagnosis not present

## 2017-09-15 DIAGNOSIS — I255 Ischemic cardiomyopathy: Secondary | ICD-10-CM

## 2017-09-15 DIAGNOSIS — I5042 Chronic combined systolic (congestive) and diastolic (congestive) heart failure: Secondary | ICD-10-CM | POA: Diagnosis not present

## 2017-09-15 NOTE — Patient Instructions (Signed)
Aim for 7+ servings of fruits and vegetables daily  80+ fluid ounces of water or unsweet tea for healthy kidneys  Limit animal fats in diet for cholesterol and heart health - choose grass fed whenever available  Aim for low stress - take time to unwind and care for your mental health  Aim for 150 min of moderate intensity exercise weekly for heart health, and weights twice weekly for bone health  Aim for 7-9 hours of sleep daily   High-Protein and High-Calorie Diet Eating high-protein and high-calorie foods can help you to gain weight, heal after an injury, and recover after an illness or surgery. What is my plan? The specific amount of daily protein and calories you need depends on:  Your body weight.  The reason this diet is recommended for you.  Generally, a high-protein, high-calorie diet involves:  Eating 250-500 extra calories each day.  Making sure that 10-35% of your daily calories come from protein.  Talk to your health care provider about how much protein and how many calories you need each day. Follow the diet as directed by your health care provider. What do I need to know about this diet?  Ask your health care provider if you should take a nutritional supplement.  Try to eat six small meals each day instead of three large meals.  Eat a balanced diet, including one food that is high in protein at each meal.  Keep nutritious snacks handy, such as nuts, trail mixes, dried fruit, and yogurt.  If you have kidney disease or diabetes, eating too much protein may put extra stress on your kidneys. Talk to your health care provider if you have either of those conditions. What are some high-protein foods? Grains Quinoa. Bulgur wheat. Vegetables Soybeans. Peas. Meats and Other Protein Sources Beef, pork, and poultry. Fish and seafood. Eggs. Tofu. Textured vegetable protein (TVP). Peanut butter. Nuts and seeds. Dried beans. Protein powders. Dairy Whole milk.  Whole-milk yogurt. Powdered milk. Cheese. Yahoo. Eggnog. Beverages High-protein supplement drinks. Soy milk. Other Protein bars. The items listed above may not be a complete list of recommended foods or beverages. Contact your dietitian for more options. What are some high-calorie foods? Grains Pasta. Quick breads. Muffins. Pancakes. Ready-to-eat cereal. Vegetables Vegetables cooked in oil or butter. Fried potatoes. Fruits Dried fruit. Fruit leather. Canned fruit in syrup. Fruit juice. Avocados. Meats and Other Protein Sources Peanut butter. Nuts and seeds. Dairy Heavy cream. Whipped cream. Cream cheese. Sour cream. Ice cream. Custard. Pudding. Beverages Meal-replacement beverages. Nutrition shakes. Fruit juice. Sugar-sweetened soft drinks. Condiments Salad dressing. Mayonnaise. Alfredo sauce. Fruit preserves or jelly. Honey. Syrup. Sweets/Desserts Cake. Cookies. Pie. Pastries. Candy bars. Chocolate. Fats and Oils Butter or margarine. Oil. Gravy. Other Meal-replacement bars. The items listed above may not be a complete list of recommended foods or beverages. Contact your dietitian for more options. What are some tips for including high-protein and high-calorie foods in my diet?  Add whole milk, half-and-half, or heavy cream to cereal, pudding, soup, or hot cocoa.  Add whole milk to instant breakfast drinks.  Add peanut butter to oatmeal or smoothies.  Add powdered milk to baked goods, smoothies, or milkshakes.  Add powdered milk, cream, or butter to mashed potatoes.  Add cheese to cooked vegetables.  Make whole-milk yogurt parfaits. Top them with granola, fruit, or nuts.  Add cottage cheese to your fruit.  Add avocados, cheese, or both to sandwiches or salads.  Add meat, poultry, or seafood to rice, pasta,  casseroles, salads, and soups.  Use mayonnaise when making egg salad, chicken salad, or tuna salad.  Use peanut butter as a topping for pretzels,  celery, or crackers.  Add beans to casseroles, dips, and spreads.  Add pureed beans to sauces and soups.  Replace calorie-free drinks with calorie-containing drinks, such as milk and fruit juice. This information is not intended to replace advice given to you by your health care provider. Make sure you discuss any questions you have with your health care provider. Document Released: 02/17/2005 Document Revised: 07/26/2015 Document Reviewed: 08/02/2013 Elsevier Interactive Patient Education  [employer].

## 2017-09-16 ENCOUNTER — Other Ambulatory Visit: Payer: Self-pay | Admitting: Adult Health

## 2017-09-16 DIAGNOSIS — N183 Chronic kidney disease, stage 3 unspecified: Secondary | ICD-10-CM

## 2017-09-16 DIAGNOSIS — D649 Anemia, unspecified: Secondary | ICD-10-CM

## 2017-09-16 LAB — COMPLETE METABOLIC PANEL WITH GFR
AG RATIO: 1.7 (calc) (ref 1.0–2.5)
ALBUMIN MSPROF: 4.1 g/dL (ref 3.6–5.1)
ALKALINE PHOSPHATASE (APISO): 90 U/L (ref 40–115)
ALT: 9 U/L (ref 9–46)
AST: 14 U/L (ref 10–35)
BILIRUBIN TOTAL: 0.6 mg/dL (ref 0.2–1.2)
BUN / CREAT RATIO: 25 (calc) — AB (ref 6–22)
BUN: 31 mg/dL — AB (ref 7–25)
CHLORIDE: 100 mmol/L (ref 98–110)
CO2: 29 mmol/L (ref 20–32)
Calcium: 9.4 mg/dL (ref 8.6–10.3)
Creat: 1.26 mg/dL — ABNORMAL HIGH (ref 0.70–1.11)
GFR, Est African American: 59 mL/min/{1.73_m2} — ABNORMAL LOW (ref >=60)
GFR, Est Non African American: 51 mL/min/{1.73_m2} — ABNORMAL LOW (ref >=60)
GLUCOSE: 92 mg/dL (ref 65–99)
Globulin: 2.4 g/dL (calc) (ref 1.9–3.7)
POTASSIUM: 4.9 mmol/L (ref 3.5–5.3)
Sodium: 138 mmol/L (ref 135–146)
Total Protein: 6.5 g/dL (ref 6.1–8.1)

## 2017-09-16 LAB — LIPID PANEL
Cholesterol: 178 mg/dL (ref <200)
HDL: 65 mg/dL (ref >40)
LDL Cholesterol (Calc): 94 mg/dL (calc)
Non-HDL Cholesterol (Calc): 113 mg/dL (calc) (ref <130)
TRIGLYCERIDES: 92 mg/dL (ref <150)
Total CHOL/HDL Ratio: 2.7 (calc) (ref <5.0)

## 2017-09-16 LAB — CBC WITH DIFFERENTIAL/PLATELET
BASOS PCT: 1 %
Basophils Absolute: 51 cells/uL (ref 0–200)
EOS ABS: 168 {cells}/uL (ref 15–500)
Eosinophils Relative: 3.3 %
HCT: 31.2 % — ABNORMAL LOW (ref 38.5–50.0)
Hemoglobin: 10.9 g/dL — ABNORMAL LOW (ref 13.2–17.1)
Lymphs Abs: 1122 cells/uL (ref 850–3900)
MCH: 32.5 pg (ref 27.0–33.0)
MCHC: 34.9 g/dL (ref 32.0–36.0)
MCV: 93.1 fL (ref 80.0–100.0)
MONOS PCT: 13.2 %
MPV: 11.1 fL (ref 7.5–12.5)
NEUTROS ABS: 3086 {cells}/uL (ref 1500–7800)
Neutrophils Relative %: 60.5 %
Platelets: 208 10*3/uL (ref 140–400)
RBC: 3.35 10*6/uL — AB (ref 4.20–5.80)
RDW: 12.6 % (ref 11.0–15.0)
Total Lymphocyte: 22 %
WBC: 5.1 10*3/uL (ref 3.8–10.8)
WBCMIX: 673 {cells}/uL (ref 200–950)

## 2017-09-16 LAB — HEMOGLOBIN A1C
EAG (MMOL/L): 6.2 (calc)
HEMOGLOBIN A1C: 5.5 %{Hb} (ref <5.7)
MEAN PLASMA GLUCOSE: 111 (calc)

## 2017-09-16 LAB — MAGNESIUM: MAGNESIUM: 2.6 mg/dL — AB (ref 1.5–2.5)

## 2017-09-16 LAB — TSH: TSH: 3.37 m[IU]/L (ref 0.40–4.50)

## 2017-10-06 ENCOUNTER — Ambulatory Visit (INDEPENDENT_AMBULATORY_CARE_PROVIDER_SITE_OTHER): Payer: Medicare Other

## 2017-10-06 DIAGNOSIS — D649 Anemia, unspecified: Secondary | ICD-10-CM | POA: Diagnosis not present

## 2017-10-06 DIAGNOSIS — N183 Chronic kidney disease, stage 3 unspecified: Secondary | ICD-10-CM

## 2017-10-06 LAB — BASIC METABOLIC PANEL WITH GFR
BUN/Creatinine Ratio: 23 (calc) — ABNORMAL HIGH (ref 6–22)
BUN: 29 mg/dL — AB (ref 7–25)
CO2: 27 mmol/L (ref 20–32)
CREATININE: 1.24 mg/dL — AB (ref 0.70–1.11)
Calcium: 9.2 mg/dL (ref 8.6–10.3)
Chloride: 101 mmol/L (ref 98–110)
GFR, Est African American: 60 mL/min/{1.73_m2} (ref >=60)
GFR, Est Non African American: 52 mL/min/{1.73_m2} — ABNORMAL LOW (ref >=60)
GLUCOSE: 87 mg/dL (ref 65–99)
Potassium: 5 mmol/L (ref 3.5–5.3)
Sodium: 137 mmol/L (ref 135–146)

## 2017-10-06 LAB — CBC WITH DIFFERENTIAL/PLATELET
BASOS PCT: 1 %
Basophils Absolute: 51 cells/uL (ref 0–200)
Eosinophils Absolute: 163 cells/uL (ref 15–500)
Eosinophils Relative: 3.2 %
HEMATOCRIT: 33.9 % — AB (ref 38.5–50.0)
Hemoglobin: 11.7 g/dL — ABNORMAL LOW (ref 13.2–17.1)
LYMPHS ABS: 974 {cells}/uL (ref 850–3900)
MCH: 32.8 pg (ref 27.0–33.0)
MCHC: 34.5 g/dL (ref 32.0–36.0)
MCV: 95 fL (ref 80.0–100.0)
MPV: 11.1 fL (ref 7.5–12.5)
Monocytes Relative: 11.6 %
Neutro Abs: 3320 cells/uL (ref 1500–7800)
Neutrophils Relative %: 65.1 %
Platelets: 207 10*3/uL (ref 140–400)
RBC: 3.57 10*6/uL — AB (ref 4.20–5.80)
RDW: 12.2 % (ref 11.0–15.0)
Total Lymphocyte: 19.1 %
WBC: 5.1 10*3/uL (ref 3.8–10.8)
WBCMIX: 592 {cells}/uL (ref 200–950)

## 2017-10-06 NOTE — Progress Notes (Signed)
Patient presents to the office for a nurse visit to have labs done and also to take home Fecal Occult test. Patient states that there aren't any changes to medications at this time.

## 2017-10-30 ENCOUNTER — Encounter: Payer: Self-pay | Admitting: *Deleted

## 2017-10-30 ENCOUNTER — Encounter: Payer: Self-pay | Admitting: Nurse Practitioner

## 2017-10-30 ENCOUNTER — Ambulatory Visit (INDEPENDENT_AMBULATORY_CARE_PROVIDER_SITE_OTHER): Payer: Medicare Other | Admitting: Nurse Practitioner

## 2017-10-30 VITALS — BP 90/64 | HR 67 | Ht 72.0 in | Wt 141.4 lb

## 2017-10-30 DIAGNOSIS — I493 Ventricular premature depolarization: Secondary | ICD-10-CM

## 2017-10-30 DIAGNOSIS — T82110A Breakdown (mechanical) of cardiac electrode, initial encounter: Secondary | ICD-10-CM | POA: Diagnosis not present

## 2017-10-30 DIAGNOSIS — I495 Sick sinus syndrome: Secondary | ICD-10-CM | POA: Diagnosis not present

## 2017-10-30 DIAGNOSIS — I255 Ischemic cardiomyopathy: Secondary | ICD-10-CM

## 2017-10-30 DIAGNOSIS — I5022 Chronic systolic (congestive) heart failure: Secondary | ICD-10-CM

## 2017-10-30 LAB — CUP PACEART INCLINIC DEVICE CHECK
Date Time Interrogation Session: 20190830101621
Implantable Lead Implant Date: 20100805
Implantable Lead Location: 753859
Implantable Lead Model: 5076
Implantable Pulse Generator Implant Date: 20180625
MDC IDC LEAD IMPLANT DT: 20100805
MDC IDC LEAD LOCATION: 753860

## 2017-10-30 LAB — BASIC METABOLIC PANEL
BUN/Creatinine Ratio: 25 — ABNORMAL HIGH (ref 10–24)
BUN: 33 mg/dL — AB (ref 8–27)
CALCIUM: 9.5 mg/dL (ref 8.6–10.2)
CO2: 24 mmol/L (ref 20–29)
CREATININE: 1.34 mg/dL — AB (ref 0.76–1.27)
Chloride: 100 mmol/L (ref 96–106)
GFR, EST AFRICAN AMERICAN: 55 mL/min/{1.73_m2} — AB (ref >59)
GFR, EST NON AFRICAN AMERICAN: 47 mL/min/{1.73_m2} — AB (ref >59)
Glucose: 93 mg/dL (ref 65–99)
Potassium: 5.2 mmol/L (ref 3.5–5.2)
Sodium: 138 mmol/L (ref 134–144)

## 2017-10-30 LAB — CBC
HEMATOCRIT: 31.7 % — AB (ref 37.5–51.0)
HEMOGLOBIN: 10.7 g/dL — AB (ref 13.0–17.7)
MCH: 31.8 pg (ref 26.6–33.0)
MCHC: 33.8 g/dL (ref 31.5–35.7)
MCV: 94 fL (ref 79–97)
Platelets: 203 10*3/uL (ref 150–450)
RBC: 3.36 x10E6/uL — AB (ref 4.14–5.80)
RDW: 12.4 % (ref 12.3–15.4)
WBC: 5.5 10*3/uL (ref 3.4–10.8)

## 2017-10-30 MED ORDER — AMIODARONE HCL 200 MG PO TABS: 200.0000 mg | ORAL_TABLET | Freq: Every day | ORAL | 3 refills | Status: DC

## 2017-10-30 NOTE — Progress Notes (Signed)
Electrophysiology Office Note Date: 10/30/2017  ID:  JACOBEY GURA, DOB Feb 02, 1928, MRN [id number]  PCP: Unk Pinto, MD Electrophysiologist: Caryl Comes  CC: Pacemaker follow-up  Richard Moody is a 84 y.o. male seen today for Dr Caryl Comes.  He presents today for routine electrophysiology followup.  Since last being seen in our clinic, the patient reports doing relatively well.  His wife has noticed bradycardia and hypotension at home.  He has also had increased fatigue.   He denies chest pain, palpitations, dyspnea, PND, orthopnea, nausea, vomiting, dizziness, syncope, edema, weight gain, or early satiety.  Device History: MDT dual chamber ICD implanted 2010 for ICM; downgrade to [facility] 2018   Past Medical History:  Diagnosis Date  . Ganglion cyst of wrist    LEFT WRIST  . GERD (gastroesophageal reflux disease)   . Hx of CABG 1992  . Hyperlipidemia   . ICD (implantable cardiac defibrillator) in place August 2010  . Ischemic cardiomyopathy    EF 32%  . MI, old    INFERIOR LATERAL  . Normal nuclear stress test June 2010   No ischemia. EF 32%  . OA (osteoarthritis) of knee   . Pre-diabetes   . S/P cardiac cath 2006   Occluded SVG to OM. Managed medically  . Vitamin D deficiency    Past Surgical History:  Procedure Laterality Date  . CARDIAC CATHETERIZATION  10/14/2004   THERE IS POSTERIOR LATERAL HYPOKINESIA. EF 30-35%  . CARDIAC CATHETERIZATION  2006   OCCLUDED SVG TO OM  . CARDIAC DEFIBRILLATOR PLACEMENT  10/2008  . CORONARY ARTERY BYPASS GRAFT  1992  . INGUINAL HERNIA REPAIR    . KNEE SURGERY     RIGHT KNEE  . NUCLEAR STRESS TEST  2010   EF 32%. No ischemia  . PPM GENERATOR CHANGEOUT N/A 08/25/2016   Procedure: PPM Generator Changeout;  Surgeon: Deboraha Sprang, MD;  Location: [place] [facility];  Service: Cardiovascular;  Laterality: N/A;    Current Outpatient Medications  Medication Sig Dispense Refill  . acetaminophen (TYLENOL) 500 MG tablet Take 500 mg by  mouth 2 (two) times daily as needed for moderate pain.     Marland Kitchen amiodarone (PACERONE) 200 MG tablet Take 1 tablet (200 mg total) by mouth daily. 90 tablet 3  . Ascorbic Acid (VITAMIN C) 1000 MG tablet Take 1,000 mg by mouth daily.    Marland Kitchen aspirin 81 MG tablet Take 81 mg by mouth every Monday, Wednesday, and Friday.     . Calcium Carb-Cholecalciferol (CALCIUM 600 + D PO) Take 1 tablet by mouth daily.    . Cholecalciferol (VITAMIN D3) 5000 UNITS CAPS Take 10,000 Units by mouth daily. Take 10,000 IU on M, W, F and 5000 IU all other days    . diphenhydrAMINE (BENADRYL) 25 MG tablet Take 25 mg by mouth at bedtime.     . docusate sodium (COLACE) 100 MG capsule Take 100 mg by mouth daily as needed for mild constipation.    . fexofenadine (ALLEGRA) 180 MG tablet Take 180 mg by mouth daily.    . halobetasol (ULTRAVATE) 0.05 % cream Apply 1 application topically daily as needed (itching).    . Magnesium 250 MG TABS Take 250 mg by mouth 2 (two) times daily.     . metoprolol succinate (TOPROL-XL) 25 MG 24 hr tablet Take 0.5 tablets (12.5 mg total) by mouth daily with supper. 45 tablet 1  . Omega-3 Fatty Acids (FISH OIL PO) Take 1 capsule by mouth at  bedtime.    [name] (SYSTANE OP) Apply 1 drop to eye daily as needed (dry eyes).    . polyethylene glycol (MIRALAX / GLYCOLAX) packet Take 17 g by mouth as needed for moderate constipation.      No current facility-administered medications for this visit.     Allergies:   Atorvastatin; Meloxicam; and Nsaids   Social History: Social History   Socioeconomic History  . Marital status: Married    Spouse name: Not on file  . Number of children: Not on file  . Years of education: Not on file  . Highest education level: Not on file  Occupational History  . Not on file  Social Needs  . Financial resource strain: Not on file  . Food insecurity:    Worry: Not on file    Inability: Not on file  . Transportation needs:    Medical: Not on  file    Non-medical: Not on file  Tobacco Use  . Smoking status: Former Smoker    Packs/day: 1.00    Years: 15.00    Pack years: 15.00    Last attempt to quit: 03/03/1982    Years since quitting: 35.6  . Smokeless tobacco: Never Used  Substance and Sexual Activity  . Alcohol use: No  . Drug use: No  . Sexual activity: Not on file  Lifestyle  . Physical activity:    Days per week: Not on file    Minutes per session: Not on file  . Stress: Not on file  Relationships  . Social connections:    Talks on phone: Not on file    Gets together: Not on file    Attends religious service: Not on file    Active member of club or organization: Not on file    Attends meetings of clubs or organizations: Not on file    Relationship status: Not on file  . Intimate partner violence:    Fear of current or ex partner: Not on file    Emotionally abused: Not on file    Physically abused: Not on file    Forced sexual activity: Not on file  Other Topics Concern  . Not on file  Social History Narrative  . Not on file    Family History: Family History  Problem Relation Age of Onset  . Cancer Mother        ABDOMINAL CANCER  . Hypertension Father   . Stroke Father   . Breast cancer Sister      Review of Systems: All other systems reviewed and are otherwise negative except as noted above.   Physical Exam: VS:  BP 90/64   Pulse 67   Ht 6' (1.829 m)   Wt 141 lb 6.4 oz (64.1 kg)   SpO2 99%   BMI 19.18 kg/m  , BMI Body mass index is 19.18 kg/m.  GEN- The patient is elderly and chronically ill appearing, alert and oriented x 3 today.   HEENT: normocephalic, atraumatic; sclera clear, conjunctiva pink; hearing intact; oropharynx clear; neck supple  Lungs- Clear to ausculation bilaterally, normal work of breathing.  No wheezes, rales, rhonchi Heart- Irregular rate and rhythm  GI- soft, non-tender, non-distended, bowel sounds present  Extremities- no clubbing, cyanosis, or edema  MS- no  significant deformity or atrophy Skin- warm and dry, no rash or lesion; PPM pocket well healed Psych- euthymic mood, full affect Neuro- strength and sensation are intact  PPM Interrogation- reviewed in detail today,  See  PACEART report  EKG:  EKG is not ordered today.  Recent Labs: 09/15/2017: ALT 9; Magnesium 2.6; TSH 3.37 10/06/2017: BUN 29; Creat 1.24; Hemoglobin 11.7; Platelets 207; Potassium 5.0; Sodium 137   Wt Readings from Last 3 Encounters:  10/30/17 141 lb 6.4 oz (64.1 kg)  09/15/17 141 lb (64 kg)  06/15/17 144 lb 3.2 oz (65.4 kg)     Other studies Reviewed: Additional studies/ records that were reviewed today include: Dr Olin Pia office notes  Assessment and Plan:  1.  Symptomatic bradycardia Pt with atrial lead fracture. He still has adequate capture but no sensing bipolar or unipolar.  Risks, benefits to atrial lead revision reviewed with the patient who wishes to proceed. Will plan with Dr Caryl Comes at next available time  2.  PVC's Burden increased recently ?if due to atrial lead malfunction For now, will increase amiodarone to 200mg  daily and follow   Current medicines are reviewed at length with the patient today.   The patient does not have concerns regarding his medicines.  The following changes were made today:  none  Labs/ tests ordered today include: pre-procedure labs Orders Placed This Encounter  Procedures  . Basic metabolic panel  . CBC  . CUP PACEART [place]  . EKG 12-Lead     Disposition:   Follow up with Dr Caryl Comes after lead revision     Signed, [name], NP 10/30/2017 11:02 AM  [facility] [address] [place] [address] [phone] (office) [phone] (fax)

## 2017-10-30 NOTE — H&P (View-Only) (Signed)
Electrophysiology Office Note Date: 10/30/2017  ID:  Richard Moody, DOB [birth year], MRN [id number]  PCP: Unk Pinto, MD Electrophysiologist: Caryl Comes  CC: Pacemaker follow-up  Richard Moody is a 84 y.o. male seen today for Dr Caryl Comes.  He presents today for routine electrophysiology followup.  Since last being seen in our clinic, the patient reports doing relatively well.  His wife has noticed bradycardia and hypotension at home.  He has also had increased fatigue.   He denies chest pain, palpitations, dyspnea, PND, orthopnea, nausea, vomiting, dizziness, syncope, edema, weight gain, or early satiety.  Device History: MDT dual chamber ICD implanted 2010 for ICM; downgrade to [facility] 2018   Past Medical History:  Diagnosis Date  . Ganglion cyst of wrist    LEFT WRIST  . GERD (gastroesophageal reflux disease)   . Hx of CABG 1992  . Hyperlipidemia   . ICD (implantable cardiac defibrillator) in place August 2010  . Ischemic cardiomyopathy    EF 32%  . MI, old    INFERIOR LATERAL  . Normal nuclear stress test June 2010   No ischemia. EF 32%  . OA (osteoarthritis) of knee   . Pre-diabetes   . S/P cardiac cath 2006   Occluded SVG to OM. Managed medically  . Vitamin D deficiency    Past Surgical History:  Procedure Laterality Date  . CARDIAC CATHETERIZATION  10/14/2004   THERE IS POSTERIOR LATERAL HYPOKINESIA. EF 30-35%  . CARDIAC CATHETERIZATION  2006   OCCLUDED SVG TO OM  . CARDIAC DEFIBRILLATOR PLACEMENT  10/2008  . CORONARY ARTERY BYPASS GRAFT  1992  . INGUINAL HERNIA REPAIR    . KNEE SURGERY     RIGHT KNEE  . NUCLEAR STRESS TEST  2010   EF 32%. No ischemia  . PPM GENERATOR CHANGEOUT N/A 08/25/2016   Procedure: PPM Generator Changeout;  Surgeon: Deboraha Sprang, MD;  Location: [place] [facility];  Service: Cardiovascular;  Laterality: N/A;    Current Outpatient Medications  Medication Sig Dispense Refill  . acetaminophen (TYLENOL) 500 MG tablet Take 500 mg by  mouth 2 (two) times daily as needed for moderate pain.     Marland Kitchen amiodarone (PACERONE) 200 MG tablet Take 1 tablet (200 mg total) by mouth daily. 90 tablet 3  . Ascorbic Acid (VITAMIN C) 1000 MG tablet Take 1,000 mg by mouth daily.    Marland Kitchen aspirin 81 MG tablet Take 81 mg by mouth every Monday, Wednesday, and Friday.     . Calcium Carb-Cholecalciferol (CALCIUM 600 + D PO) Take 1 tablet by mouth daily.    . Cholecalciferol (VITAMIN D3) 5000 UNITS CAPS Take 10,000 Units by mouth daily. Take 10,000 IU on M, W, F and 5000 IU all other days    . diphenhydrAMINE (BENADRYL) 25 MG tablet Take 25 mg by mouth at bedtime.     . docusate sodium (COLACE) 100 MG capsule Take 100 mg by mouth daily as needed for mild constipation.    . fexofenadine (ALLEGRA) 180 MG tablet Take 180 mg by mouth daily.    . halobetasol (ULTRAVATE) 0.05 % cream Apply 1 application topically daily as needed (itching).    . Magnesium 250 MG TABS Take 250 mg by mouth 2 (two) times daily.     . metoprolol succinate (TOPROL-XL) 25 MG 24 hr tablet Take 0.5 tablets (12.5 mg total) by mouth daily with supper. 45 tablet 1  . Omega-3 Fatty Acids (FISH OIL PO) Take 1 capsule by mouth at  bedtime.    [name] (SYSTANE OP) Apply 1 drop to eye daily as needed (dry eyes).    . polyethylene glycol (MIRALAX / GLYCOLAX) packet Take 17 g by mouth as needed for moderate constipation.      No current facility-administered medications for this visit.     Allergies:   Atorvastatin; Meloxicam; and Nsaids   Social History: Social History   Socioeconomic History  . Marital status: Married    Spouse name: Not on file  . Number of children: Not on file  . Years of education: Not on file  . Highest education level: Not on file  Occupational History  . Not on file  Social Needs  . Financial resource strain: Not on file  . Food insecurity:    Worry: Not on file    Inability: Not on file  . Transportation needs:    Medical: Not on  file    Non-medical: Not on file  Tobacco Use  . Smoking status: Former Smoker    Packs/day: 1.00    Years: 15.00    Pack years: 15.00    Last attempt to quit: 03/03/1982    Years since quitting: 35.6  . Smokeless tobacco: Never Used  Substance and Sexual Activity  . Alcohol use: No  . Drug use: No  . Sexual activity: Not on file  Lifestyle  . Physical activity:    Days per week: Not on file    Minutes per session: Not on file  . Stress: Not on file  Relationships  . Social connections:    Talks on phone: Not on file    Gets together: Not on file    Attends religious service: Not on file    Active member of club or organization: Not on file    Attends meetings of clubs or organizations: Not on file    Relationship status: Not on file  . Intimate partner violence:    Fear of current or ex partner: Not on file    Emotionally abused: Not on file    Physically abused: Not on file    Forced sexual activity: Not on file  Other Topics Concern  . Not on file  Social History Narrative  . Not on file    Family History: Family History  Problem Relation Age of Onset  . Cancer Mother        ABDOMINAL CANCER  . Hypertension Father   . Stroke Father   . Breast cancer Sister      Review of Systems: All other systems reviewed and are otherwise negative except as noted above.   Physical Exam: VS:  BP 90/64   Pulse 67   Ht 6' (1.829 m)   Wt 141 lb 6.4 oz (64.1 kg)   SpO2 99%   BMI 19.18 kg/m  , BMI Body mass index is 19.18 kg/m.  GEN- The patient is elderly and chronically ill appearing, alert and oriented x 3 today.   HEENT: normocephalic, atraumatic; sclera clear, conjunctiva pink; hearing intact; oropharynx clear; neck supple  Lungs- Clear to ausculation bilaterally, normal work of breathing.  No wheezes, rales, rhonchi Heart- Irregular rate and rhythm  GI- soft, non-tender, non-distended, bowel sounds present  Extremities- no clubbing, cyanosis, or edema  MS- no  significant deformity or atrophy Skin- warm and dry, no rash or lesion; PPM pocket well healed Psych- euthymic mood, full affect Neuro- strength and sensation are intact  PPM Interrogation- reviewed in detail today,  See  PACEART report  EKG:  EKG is not ordered today.  Recent Labs: 09/15/2017: ALT 9; Magnesium 2.6; TSH 3.37 10/06/2017: BUN 29; Creat 1.24; Hemoglobin 11.7; Platelets 207; Potassium 5.0; Sodium 137   Wt Readings from Last 3 Encounters:  10/30/17 141 lb 6.4 oz (64.1 kg)  09/15/17 141 lb (64 kg)  06/15/17 144 lb 3.2 oz (65.4 kg)     Other studies Reviewed: Additional studies/ records that were reviewed today include: Dr Olin Pia office notes  Assessment and Plan:  1.  Symptomatic bradycardia Pt with atrial lead fracture. He still has adequate capture but no sensing bipolar or unipolar.  Risks, benefits to atrial lead revision reviewed with the patient who wishes to proceed. Will plan with Dr Caryl Comes at next available time  2.  PVC's Burden increased recently ?if due to atrial lead malfunction For now, will increase amiodarone to 200mg  daily and follow   Current medicines are reviewed at length with the patient today.   The patient does not have concerns regarding his medicines.  The following changes were made today:  none  Labs/ tests ordered today include: pre-procedure labs Orders Placed This Encounter  Procedures  . Basic metabolic panel  . CBC  . CUP PACEART [place]  . EKG 12-Lead     Disposition:   Follow up with Dr Caryl Comes after lead revision     Signed, [name], NP 10/30/2017 11:02 AM  [facility] [address] [place] [address] [phone] (office) [phone] (fax)

## 2017-10-30 NOTE — Patient Instructions (Addendum)
Medication Instructions:   START TAKING AMIODARONE ONE TABLET ONCE  A DAY   If you need a refill on your cardiac medications before your next appointment, please call your pharmacy.   Labwork: CBC BMET  TODAY    Testing/Procedures:  DEVICE LEAD CHANGE OUT  WITH DR Caryl Comes    Follow-Up:  AFTER 11-04-17   10 DAYS WOUND CHECK WITH  DEVICE CLINIC  91 DAYS WITH DR Sunnie Nielsen PACER [facility]      Any Other Special Instructions Will Be Listed Below (If Applicable).

## 2017-11-04 ENCOUNTER — Ambulatory Visit (HOSPITAL_COMMUNITY)
Admission: RE | Admit: 2017-11-04 | Discharge: 2017-11-05 | Disposition: A | Discharge status: Home / Self Care | Payer: Medicare Other | Source: Ambulatory Visit | Attending: Internal Medicine | Admitting: Internal Medicine

## 2017-11-04 ENCOUNTER — Other Ambulatory Visit: Payer: Self-pay

## 2017-11-04 ENCOUNTER — Encounter (HOSPITAL_COMMUNITY): Payer: Self-pay | Admitting: General Practice

## 2017-11-04 ENCOUNTER — Ambulatory Visit (HOSPITAL_COMMUNITY): Payer: Medicare Other

## 2017-11-04 ENCOUNTER — Encounter (HOSPITAL_COMMUNITY)
Admission: RE | Disposition: A | Discharge status: Home / Self Care | Payer: Self-pay | Source: Ambulatory Visit | Attending: Internal Medicine

## 2017-11-04 DIAGNOSIS — Z886 Allergy status to analgesic agent status: Secondary | ICD-10-CM | POA: Insufficient documentation

## 2017-11-04 DIAGNOSIS — I6782 Cerebral ischemia: Secondary | ICD-10-CM | POA: Diagnosis not present

## 2017-11-04 DIAGNOSIS — E559 Vitamin D deficiency, unspecified: Secondary | ICD-10-CM | POA: Diagnosis not present

## 2017-11-04 DIAGNOSIS — T82118A Breakdown (mechanical) of other cardiac electronic device, initial encounter: Secondary | ICD-10-CM | POA: Insufficient documentation

## 2017-11-04 DIAGNOSIS — K219 Gastro-esophageal reflux disease without esophagitis: Secondary | ICD-10-CM | POA: Diagnosis not present

## 2017-11-04 DIAGNOSIS — M179 Osteoarthritis of knee, unspecified: Secondary | ICD-10-CM | POA: Insufficient documentation

## 2017-11-04 DIAGNOSIS — I252 Old myocardial infarction: Secondary | ICD-10-CM | POA: Diagnosis not present

## 2017-11-04 DIAGNOSIS — Y831 Surgical operation with implant of artificial internal device as the cause of abnormal reaction of the patient, or of later complication, without mention of misadventure at the time of the procedure: Secondary | ICD-10-CM | POA: Diagnosis not present

## 2017-11-04 DIAGNOSIS — T82110A Breakdown (mechanical) of cardiac electrode, initial encounter: Secondary | ICD-10-CM

## 2017-11-04 DIAGNOSIS — Z7982 Long term (current) use of aspirin: Secondary | ICD-10-CM | POA: Diagnosis not present

## 2017-11-04 DIAGNOSIS — I493 Ventricular premature depolarization: Secondary | ICD-10-CM | POA: Diagnosis not present

## 2017-11-04 DIAGNOSIS — E785 Hyperlipidemia, unspecified: Secondary | ICD-10-CM | POA: Insufficient documentation

## 2017-11-04 DIAGNOSIS — Z888 Allergy status to other drugs, medicaments and biological substances status: Secondary | ICD-10-CM | POA: Diagnosis not present

## 2017-11-04 DIAGNOSIS — Z9889 Other specified postprocedural states: Secondary | ICD-10-CM | POA: Insufficient documentation

## 2017-11-04 DIAGNOSIS — I2510 Atherosclerotic heart disease of native coronary artery without angina pectoris: Secondary | ICD-10-CM | POA: Diagnosis not present

## 2017-11-04 DIAGNOSIS — Z87891 Personal history of nicotine dependence: Secondary | ICD-10-CM | POA: Diagnosis not present

## 2017-11-04 DIAGNOSIS — Z79899 Other long term (current) drug therapy: Secondary | ICD-10-CM | POA: Insufficient documentation

## 2017-11-04 DIAGNOSIS — Z959 Presence of cardiac and vascular implant and graft, unspecified: Secondary | ICD-10-CM

## 2017-11-04 DIAGNOSIS — I083 Combined rheumatic disorders of mitral, aortic and tricuspid valves: Secondary | ICD-10-CM | POA: Insufficient documentation

## 2017-11-04 DIAGNOSIS — G319 Degenerative disease of nervous system, unspecified: Secondary | ICD-10-CM | POA: Diagnosis not present

## 2017-11-04 DIAGNOSIS — R7303 Prediabetes: Secondary | ICD-10-CM | POA: Diagnosis not present

## 2017-11-04 DIAGNOSIS — I420 Dilated cardiomyopathy: Secondary | ICD-10-CM | POA: Diagnosis not present

## 2017-11-04 DIAGNOSIS — Z8249 Family history of ischemic heart disease and other diseases of the circulatory system: Secondary | ICD-10-CM | POA: Insufficient documentation

## 2017-11-04 DIAGNOSIS — K449 Diaphragmatic hernia without obstruction or gangrene: Secondary | ICD-10-CM | POA: Diagnosis not present

## 2017-11-04 DIAGNOSIS — Z951 Presence of aortocoronary bypass graft: Secondary | ICD-10-CM | POA: Insufficient documentation

## 2017-11-04 DIAGNOSIS — I5030 Unspecified diastolic (congestive) heart failure: Secondary | ICD-10-CM | POA: Insufficient documentation

## 2017-11-04 DIAGNOSIS — I495 Sick sinus syndrome: Secondary | ICD-10-CM | POA: Diagnosis present

## 2017-11-04 DIAGNOSIS — I255 Ischemic cardiomyopathy: Secondary | ICD-10-CM | POA: Diagnosis not present

## 2017-11-04 DIAGNOSIS — H532 Diplopia: Secondary | ICD-10-CM | POA: Insufficient documentation

## 2017-11-04 DIAGNOSIS — Z823 Family history of stroke: Secondary | ICD-10-CM | POA: Insufficient documentation

## 2017-11-04 HISTORY — DX: Essential (primary) hypertension: I10

## 2017-11-04 HISTORY — PX: LEAD REVISION/REPAIR: EP1213

## 2017-11-04 LAB — LIPID PANEL
Cholesterol: 154 mg/dL (ref 0–200)
HDL: 57 mg/dL
LDL Cholesterol: 84 mg/dL (ref 0–99)
Total CHOL/HDL Ratio: 2.7 ratio
Triglycerides: 63 mg/dL
VLDL: 13 mg/dL (ref 0–40)

## 2017-11-04 LAB — SURGICAL PCR SCREEN
MRSA, PCR: NEGATIVE
Staphylococcus aureus: NEGATIVE

## 2017-11-04 LAB — HEMOGLOBIN A1C
Hgb A1c MFr Bld: 5.7 % — ABNORMAL HIGH (ref 4.8–5.6)
MEAN PLASMA GLUCOSE: 116.89 mg/dL

## 2017-11-04 LAB — GLUCOSE, CAPILLARY
GLUCOSE-CAPILLARY: 94 mg/dL (ref 70–99)
Glucose-Capillary: 90 mg/dL (ref 70–99)
Glucose-Capillary: 150 mg/dL — ABNORMAL HIGH (ref 70–99)

## 2017-11-04 SURGERY — LEAD REVISION/REPAIR

## 2017-11-04 MED ORDER — ACETAMINOPHEN 500 MG PO TABS: 500.0000 mg | ORAL_TABLET | Freq: Two times a day (BID) | ORAL | Status: DC | PRN

## 2017-11-04 MED ORDER — CEFAZOLIN SODIUM-DEXTROSE 1-4 GM/50ML-% IV SOLN
1.0000 g | Freq: Four times a day (QID) | INTRAVENOUS | Status: AC
  Administered 2017-11-04 – 2017-11-05 (×3): 1 g via INTRAVENOUS
  Filled 2017-11-04 (×3): qty 50

## 2017-11-04 MED ORDER — CEFAZOLIN SODIUM-DEXTROSE 2-4 GM/100ML-% IV SOLN
INTRAVENOUS | Status: AC
  Filled 2017-11-04: qty 100

## 2017-11-04 MED ORDER — MUPIROCIN 2 % EX OINT
TOPICAL_OINTMENT | CUTANEOUS | Status: AC
  Filled 2017-11-04: qty 22

## 2017-11-04 MED ORDER — LIDOCAINE HCL (PF) 1 % IJ SOLN
INTRAMUSCULAR | Status: AC
  Filled 2017-11-04: qty 60

## 2017-11-04 MED ORDER — HEPARIN (PORCINE) IN NACL 1000-0.9 UT/500ML-% IV SOLN
INTRAVENOUS | Status: AC
  Filled 2017-11-04: qty 500

## 2017-11-04 MED ORDER — VITAMIN D3 125 MCG (5000 UT) PO CAPS: 5000.0000 [IU] | ORAL_CAPSULE | ORAL | Status: DC

## 2017-11-04 MED ORDER — VITAMIN D 1000 UNITS PO TABS
10000.0000 [IU] | ORAL_TABLET | ORAL | Status: DC
  Filled 2017-11-04: qty 10

## 2017-11-04 MED ORDER — DOCUSATE SODIUM 100 MG PO CAPS: 100.0000 mg | ORAL_CAPSULE | Freq: Every day | ORAL | Status: DC | PRN

## 2017-11-04 MED ORDER — LIDOCAINE HCL (PF) 1 % IJ SOLN
INTRAMUSCULAR | Status: DC | PRN
  Administered 2017-11-04: 45 mL

## 2017-11-04 MED ORDER — HALOBETASOL PROPIONATE 0.05 % EX CREA: 1.0000 "application " | TOPICAL_CREAM | Freq: Every day | CUTANEOUS | Status: DC | PRN

## 2017-11-04 MED ORDER — IOPAMIDOL (ISOVUE-370) INJECTION 76%
INTRAVENOUS | Status: DC | PRN
  Administered 2017-11-04: 10 mL via INTRAVENOUS

## 2017-11-04 MED ORDER — SODIUM CHLORIDE 0.9 % IV SOLN
INTRAVENOUS | Status: DC
  Administered 2017-11-04: 09:00:00 via INTRAVENOUS

## 2017-11-04 MED ORDER — ACETAMINOPHEN 325 MG PO TABS: 325.0000 mg | ORAL_TABLET | ORAL | Status: DC | PRN

## 2017-11-04 MED ORDER — POLYETHYLENE GLYCOL 3350 17 G PO PACK: 17.0000 g | PACK | ORAL | Status: DC | PRN

## 2017-11-04 MED ORDER — IOPAMIDOL (ISOVUE-370) INJECTION 76%
INTRAVENOUS | Status: AC
  Filled 2017-11-04: qty 50

## 2017-11-04 MED ORDER — MUPIROCIN 2 % EX OINT
1.0000 "application " | TOPICAL_OINTMENT | Freq: Once | CUTANEOUS | Status: AC
  Administered 2017-11-04: 1 via TOPICAL

## 2017-11-04 MED ORDER — MAGNESIUM 250 MG PO TABS: 250.0000 mg | ORAL_TABLET | Freq: Two times a day (BID) | ORAL | Status: DC

## 2017-11-04 MED ORDER — METOPROLOL SUCCINATE ER 25 MG PO TB24
12.5000 mg | ORAL_TABLET | Freq: Every day | ORAL | Status: DC
  Administered 2017-11-04 – 2017-11-05 (×2): 12.5 mg via ORAL
  Filled 2017-11-04 (×2): qty 1

## 2017-11-04 MED ORDER — AMIODARONE HCL 200 MG PO TABS
200.0000 mg | ORAL_TABLET | Freq: Every day | ORAL | Status: DC
  Administered 2017-11-05: 200 mg via ORAL
  Filled 2017-11-04: qty 1

## 2017-11-04 MED ORDER — ONDANSETRON HCL 4 MG/2ML IJ SOLN: 4.0000 mg | Freq: Four times a day (QID) | INTRAMUSCULAR | Status: DC | PRN

## 2017-11-04 MED ORDER — CEFAZOLIN SODIUM-DEXTROSE 2-4 GM/100ML-% IV SOLN
2.0000 g | INTRAVENOUS | Status: AC
  Administered 2017-11-04: 2 g via INTRAVENOUS

## 2017-11-04 MED ORDER — SODIUM CHLORIDE 0.9 % IV SOLN
80.0000 mg | INTRAVENOUS | Status: AC
  Administered 2017-11-04: 80 mg

## 2017-11-04 MED ORDER — VITAMIN D 1000 UNITS PO TABS
5000.0000 [IU] | ORAL_TABLET | ORAL | Status: DC
  Administered 2017-11-05: 5000 [IU] via ORAL

## 2017-11-04 MED ORDER — SODIUM CHLORIDE 0.9 % IV SOLN: INTRAVENOUS | Status: AC

## 2017-11-04 MED ORDER — DIPHENHYDRAMINE HCL 25 MG PO TABS
25.0000 mg | ORAL_TABLET | Freq: Every evening | ORAL | Status: DC | PRN
  Administered 2017-11-04: 25 mg via ORAL
  Filled 2017-11-04 (×6): qty 1

## 2017-11-04 MED ORDER — SODIUM CHLORIDE 0.9 % IV SOLN
INTRAVENOUS | Status: AC
  Filled 2017-11-04: qty 2

## 2017-11-04 MED ORDER — ASPIRIN EC 81 MG PO TBEC
81.0000 mg | DELAYED_RELEASE_TABLET | ORAL | Status: DC
  Filled 2017-11-04: qty 1

## 2017-11-04 MED ORDER — VITAMIN C 500 MG PO TABS
1000.0000 mg | ORAL_TABLET | Freq: Every day | ORAL | Status: DC
  Administered 2017-11-05: 1000 mg via ORAL
  Filled 2017-11-04: qty 2

## 2017-11-04 SURGICAL SUPPLY — 10 items
CABLE SURGICAL S-101-97-12 (CABLE) ×3 IMPLANT
HEMOSTAT SURGICEL 2X4 FIBR (HEMOSTASIS) ×3 IMPLANT
KIT LEAD END CAP (Cap) IMPLANT
LEAD CAPSURE NOVUS 5076-52CM (Lead) ×3 IMPLANT
LEAD END CAP (Cap) ×2 IMPLANT
PAD PRO RADIOLUCENT 2001M-C (PAD) ×3 IMPLANT
SHEATH CLASSIC 7F (SHEATH) ×2 IMPLANT
SHEATH CLASSIC 8F 25CM (SHEATH) ×3 IMPLANT
TRAY PACEMAKER INSERTION (PACKS) ×3 IMPLANT
WIRE HI TORQ VERSACORE-J 145CM (WIRE) ×3 IMPLANT

## 2017-11-04 NOTE — Progress Notes (Signed)
Called by staff to notify of new double vision, Dr. Caryl Comes recommended to call code stroke Upon my arrival to bed side the patient could see clearly at close range, though reported seeing double at a distance.  This is reported new. He had no unilateral weakness, numbness.  BP stable, no CP, palpitations or SOB Code stroke team arrived, and double vision had resolved. At their recommendation ordered CT code stroke, lipid panel and Hgb A1c. They will follow  Tommye Standard, PA-C

## 2017-11-04 NOTE — Consult Note (Addendum)
Neurology Consultation  Reason for Consult: Code stroke Referring Physician: Caryl Comes   History is obtained from: Patient and physician assistant at bedside  HPI: Richard Moody is a 84 y.o. male with history of status post cardiac cath, prediabetes, MI, ischemic cardiomyopathy, ICD placement, hyperlipidemia.  Patient had a right atrial lead replacement today.  Status post replacement patient complained of seeing double vision which was side-by-side however approximately 3 to 4 minutes after complaining he had no double vision.  When he did have the double vision it was noted to be at a distance but not close up.  Multiple times his vision was tested both close-up and this did not any continues a he had no further double vision.  Patient does use reading glasses but had no difficulty reading the NIH stroke scale cards.  Patient denied any weakness but does state he has some numbness and his left hand however this is not new this is old.   LKW: 6045 on 11/04/2017 tpa given?: no, resolved Premorbid modified Rankin scale (mRS): 0 NIHSS 0  ROS: A 14 point ROS was performed and is negative except as noted in the HPI.   Past Medical History:  Diagnosis Date  . Ganglion cyst of wrist    LEFT WRIST  . GERD (gastroesophageal reflux disease)   . Hx of CABG 1992  . Hyperlipidemia   . ICD (implantable cardiac defibrillator) in place August 2010  . Ischemic cardiomyopathy    EF 32%  . MI, old    INFERIOR LATERAL  . Normal nuclear stress test June 2010   No ischemia. EF 32%  . OA (osteoarthritis) of knee   . Pre-diabetes   . S/P cardiac cath 2006   Occluded SVG to OM. Managed medically  . Vitamin D deficiency      Family History  Problem Relation Age of Onset  . Cancer Mother        ABDOMINAL CANCER  . Hypertension Father   . Stroke Father   . Breast cancer Sister      Social History:   reports that he quit smoking about 35 years ago. He has a 15.00 pack-year smoking history. He has  never used smokeless tobacco. He reports that he does not drink alcohol or use drugs.  Medications  Current Facility-Administered Medications:  .  0.9 %  sodium chloride infusion, , Intravenous, Continuous, Deboraha Sprang, MD, Last Rate: 50 mL/hr at 11/04/17 0925 .  0.9 %  sodium chloride infusion, , Intravenous, Continuous, Deboraha Sprang, MD, Last Rate: 50 mL/hr at 11/04/17 0926 .  0.9 %  sodium chloride infusion, , Intravenous, Continuous, Deboraha Sprang, MD, Last Rate: 50 mL/hr at 11/04/17 1202 .  mupirocin ointment (BACTROBAN) 2 %, , , ,    Exam: Current vital signs: BP (!) 105/57   Pulse (!) 32   Temp 98.1 F (36.7 C) (Oral)   Resp 18   Ht 6' (1.829 m)   Wt 63.5 kg   SpO2 95%   BMI 18.99 kg/m  Vital signs in last 24 hours: Temp:  [98.1 F (36.7 C)] 98.1 F (36.7 C) (09/04 0844) Pulse Rate:  [0-237] 32 (09/04 1300) Resp:  [0-29] 18 (09/04 1300) BP: (105-135)/(49-74) 105/57 (09/04 1255) SpO2:  [0 %-100 %] 95 % (09/04 1300) Weight:  [63.5 kg] 63.5 kg (09/04 0844)  GENERAL: Awake, alert in NAD HEENT: - Normocephalic and atraumatic,  CV - S1S2 in bigeminy but not A. fib noted on telemetry Ext:  warm, well perfused, intact peripheral pulses, __ edema  NEURO:  Mental Status: AA&Ox3, speech is clear.  Naming, repetition, fluency, and comprehension intact. Cranial Nerves: PERRL 2 mm/brisk. EOMI, visual fields full, no facial asymmetry, facial sensation intact, hearing intact, tongue/uvula/soft palate midline,  Motor: Left arm in sling secondary to having surgery.  Right arm shows 5 out of 5 strength.  Bilateral leg shows 5/5 strength. Tone: is normal and bulk is normal Sensation- Intact to light touch bilaterally Coordination: FTN intact on the right Gait-deferred   Labs I have reviewed labs in epic and the results pertinent to this consultation are:   CBC    Component Value Date/Time   WBC 5.5 10/30/2017 1116   WBC 5.1 10/06/2017 1022   RBC 3.36 (L) 10/30/2017  1116   RBC 3.57 (L) 10/06/2017 1022   HGB 10.7 (L) 10/30/2017 1116   HCT 31.7 (L) 10/30/2017 1116   PLT 203 10/30/2017 1116   MCV 94 10/30/2017 1116   MCH 31.8 10/30/2017 1116   MCH 32.8 10/06/2017 1022   MCHC 33.8 10/30/2017 1116   MCHC 34.5 10/06/2017 1022   RDW 12.4 10/30/2017 1116   LYMPHSABS 974 10/06/2017 1022   LYMPHSABS 1.3 08/20/2016 1450   MONOABS 448 07/30/2016 1546   EOSABS 163 10/06/2017 1022   EOSABS 0.2 08/20/2016 1450   BASOSABS 51 10/06/2017 1022   BASOSABS 0.0 08/20/2016 1450    CMP     Component Value Date/Time   NA 138 10/30/2017 1116   K 5.2 10/30/2017 1116   CL 100 10/30/2017 1116   CO2 24 10/30/2017 1116   GLUCOSE 93 10/30/2017 1116   GLUCOSE 87 10/06/2017 1022   BUN 33 (H) 10/30/2017 1116   CREATININE 1.34 (H) 10/30/2017 1116   CREATININE 1.24 (H) 10/06/2017 1022   CALCIUM 9.5 10/30/2017 1116   PROT 6.5 09/15/2017 1607   ALBUMIN 3.7 07/30/2016 1546   AST 14 09/15/2017 1607   ALT 9 09/15/2017 1607   ALKPHOS 96 07/30/2016 1546   BILITOT 0.6 09/15/2017 1607   GFRNONAA 47 (L) 10/30/2017 1116   GFRNONAA 52 (L) 10/06/2017 1022   GFRAA 55 (L) 10/30/2017 1116   GFRAA 60 10/06/2017 1022    Lipid Panel     Component Value Date/Time   CHOL 178 09/15/2017 1607   TRIG 92 09/15/2017 1607   HDL 65 09/15/2017 1607   CHOLHDL 2.7 09/15/2017 1607   VLDL 24 07/30/2016 1546   LDLCALC 94 09/15/2017 1607     Imaging I have reviewed the images obtained:  CT-scan of the brain--significant amount of atrophy with old right thalamic infarct and no acute intracranial processes  MRI examination of the brain--unable to obtain secondary to ICD  Assessment: 84 year old male with transient diplopia status post revision of right atrial lead.  I think the etiology of his diplopia is slightly unclear, it is possible that he has had a tiny ischemic infarct, but I would not favor MRI so close to his lead revision.  I am not sure if vascular imaging would really change  her management at this time and his creatinine is borderline so I will not pursue this.  I do think an echo would be reasonable to have it demonstrates clear evidence of thrombus then anticoagulation would be indicated.  Recommendations: -LDL and A1c, goal LDL less than 70 - Echo -Telemetry -Antiplatelet therapy -If he continues to complain of diplopia in the future, may consider myasthenia antibodies, ophthalmology evaluation  -neurology will continue to follow  Roland Rack, MD [facility] [phone]  If 7pm- 7am, please page neurology on call as listed in [place].

## 2017-11-04 NOTE — Progress Notes (Signed)
At 1320-Pt reported "seeing double". Dr Caryl Comes contacted-Code Stroke was called. Pt was examined by Code Stroke Team and CT of head was done. Reported to have negative findings. CBG 90. Symptoms have partially resolved and has been cleared for transfer to [place].

## 2017-11-04 NOTE — Discharge Summary (Addendum)
ELECTROPHYSIOLOGY PROCEDURE DISCHARGE SUMMARY    Patient ID: Richard Moody,  MRN: [id number], DOB/AGE: 07/31/1927 84 y.o.  Admit date: 11/04/2017 Discharge date: 11/05/2017  Primary Care Physician: Unk Pinto, MD  Electrophysiologist: Dr. Caryl Comes  Primary Discharge Diagnosis:  1. PPM A lead failure  Secondary Discharge Diagnosis:  1. PVCs 2. HTN 3. CAD 4. ICM   Allergies  Allergen Reactions  . Atorvastatin     Elevated cpk   . Meloxicam Other (See Comments)    edema  . Nsaids Other (See Comments)    dyspepsia      Procedures This Admission:  1.  Implantation of a new RA lead, Medtronic 507 [id number] cm lead serial #[id number] There were no immediate post procedure complications. 2.  CXR on 11/05/17 demonstrated no pneumothorax status post device implantation.   Brief HPI: Richard Moody is a 84 y.o. male is followed out patient by electrophysiology service where he was noted to have A lead failure.  Past medical history includes above .  Risks, benefits, and alternatives to PPM lead revision were reviewed with the patient who wished to proceed.   Hospital Course:  The patient was admitted and underwent implantation of a new RA lead with details as outlined above. In the post procedural ara he commented to the staff that he was seeing double.  This was only at distance vision, no other deficits or complaints were noted, code stroke was called, neurology team assessed the patient who's double vision had resolved spontaneously.  Code stroke CT was done without acute findings.  Neurology recommended LDL goal to below 70 and antiplatelet therapy with his ASA, as we would for his CAD.  Not clearly a neurological event.  Echo was done without thrombus noted. The patient reports having had a severe problem with Lipitor that "nearly killed him" per his wife. We will revisit statin therapy out patient.  He was monitored on telemetry overnight which demonstrated AP/VP frequent PVCs  (known for him).  Left chest was without hematoma or ecchymosis.  The device was interrogated and found to be functioning normally.  CXR was obtained and demonstrated no pneumothorax status post device implantation.  Wound care, arm mobility, and restrictions were reviewed with the patient.  The patient had no further visual or neuro symptoms of any kind, is feeling well today without CP or SOB, he was examined by Dr.Klein and considered stable for discharge to home once echo completed. The patient walks with the aid of a walker at home, the patient and his wife report that he does quite well, did not feel he would need any home health help   Physical Exam: Vitals:   11/05/17 0600 11/05/17 0817 11/05/17 0831 11/05/17 1354  BP: (!) 128/59  114/67 (!) 108/57  Pulse:    (!) 105  Resp: 17  15 12   Temp: 98.6 F (37 C) 98.5 F (36.9 C)  98.1 F (36.7 C)  TempSrc: Oral Oral  Oral  SpO2: 98%  96% 95%  Weight: 63.2 kg     Height:        GEN- The patient is well appearing, alert and oriented x 3 today.   HEENT: normocephalic, atraumatic; sclera clear, conjunctiva pink; hearing intact; oropharynx clear; neck supple, no JVP Lungs- CTA b/l, normal work of breathing.  No wheezes, rales, rhonchi Heart- RRR, no murmurs, rubs or gallops, PMI not laterally displaced GI- soft, non-tender, non-distended Extremities- no clubbing, cyanosis, or edema MS- no significant deformity, age  appropriate atrophy Skin- warm and dry, no rash or lesion, left chest without hematoma/ecchymosis Psych- euthymic mood, full affect Neuro-  no gross deficits   Labs:   Lab Results  Component Value Date   WBC 5.5 10/30/2017   HGB 10.7 (L) 10/30/2017   HCT 31.7 (L) 10/30/2017   MCV 94 10/30/2017   PLT 203 10/30/2017    Recent Labs  Lab 10/30/17 1116  NA 138  K 5.2  CL 100  CO2 24  BUN 33*  CREATININE 1.34*  CALCIUM 9.5  GLUCOSE 93    Discharge Medications:  Allergies as of 11/05/2017      Reactions    Atorvastatin    Elevated cpk   Meloxicam Other (See Comments)   edema   Nsaids Other (See Comments)   dyspepsia      Medication List    TAKE these medications   amiodarone 200 MG tablet Commonly known as:  PACERONE Take 1 tablet (200 mg total) by mouth daily.   aspirin 81 MG tablet Take 81 mg by mouth every Monday, Wednesday, and Friday.   CALCIUM 600 + D PO Take 1 tablet by mouth daily.   diphenhydrAMINE 25 MG tablet Commonly known as:  BENADRYL Take 25 mg by mouth at bedtime as needed for sleep.   docusate sodium 100 MG capsule Commonly known as:  COLACE Take 100 mg by mouth daily as needed for mild constipation.   fexofenadine 180 MG tablet Commonly known as:  ALLEGRA Take 180 mg by mouth daily as needed for allergies.   FISH OIL PO Take 1 capsule by mouth at bedtime.   halobetasol 0.05 % cream Commonly known as:  ULTRAVATE Apply 1 application topically daily as needed (itching).   Magnesium 250 MG Tabs Take 250 mg by mouth 2 (two) times daily.   metoprolol succinate 25 MG 24 hr tablet Commonly known as:  TOPROL-XL Take 0.5 tablets (12.5 mg total) by mouth daily with supper.   polyethylene glycol packet Commonly known as:  MIRALAX / GLYCOLAX Take 17 g by mouth as needed for moderate constipation.   SYSTANE OP Apply 1 drop to eye daily as needed (dry eyes).   TYLENOL 500 MG tablet Generic drug:  acetaminophen Take 500 mg by mouth 2 (two) times daily as needed for moderate pain.   vitamin C 1000 MG tablet Take 1,000 mg by mouth daily.   Vitamin D3 5000 units Caps Take 5,000-10,000 Units by mouth See admin instructions. Take 10,000 IU on M, W, F and 5000 IU all other days       Disposition:  Home  Discharge Instructions    Diet - low sodium heart healthy   Complete by:  As directed    Increase activity slowly   Complete by:  As directed      Follow-up Information    [facility] Follow up on 11/18/2017.   Specialty:   Cardiology Why:  10:00AM, wound check visit Contact information: [address], [address] [place]       Deboraha Sprang, MD Follow up on 02/04/2018.   Specialty:  Cardiology Why:  2:00PM Contact information: [address] [address] [phone]           Duration of Discharge Encounter: Greater than 30 minutes including physician time.  Venetia Night, PA-C 11/05/2017 4:25 PM

## 2017-11-04 NOTE — Code Documentation (Signed)
84 yo Male from Cath Lab post-atrial line revision of the pacemaker reported seeing double vision. Staff activated a Code Stroke. Stroke Team met patient in Cath Lab. Initial NIHSS 0. No deficits noted beside patient reporting double vision with far objects. Pt taken for CT Head. No abnormality noted on CT. Pt to be admitted to [facility]. No tPA due to too mild to treat. Handoff given to Chip, RN Cath Lab.

## 2017-11-04 NOTE — Discharge Instructions (Signed)
° ° °  Supplemental Discharge Instructions for  Pacemaker/Defibrillator Patients  Activity No heavy lifting or vigorous activity with your left/right arm for 6 to 8 weeks.  Do not raise your left/right arm above your head for one week.  Gradually raise your affected arm as drawn below.              11/08/17                      11/09/17                      11/10/17                   11/11/17 __  NO DRIVING for 1 week   ; you may begin driving on   4/69/62  .  WOUND CARE - Keep the wound area clean and dry.  Do not get this area wet for one week. No showers for one week; you may shower on  11/11/17   . - The tape/steri-strips on your wound will fall off; do not pull them off.  No bandage is needed on the site.  DO  NOT apply any creams, oils, or ointments to the wound area. - If you notice any drainage or discharge from the wound, any swelling or bruising at the site, or you develop a fever > 101? F after you are discharged home, call the office at once.  Special Instructions - You are still able to use cellular telephones; use the ear opposite the side where you have your pacemaker/defibrillator.  Avoid carrying your cellular phone near your device. - When traveling through airports, show security personnel your identification card to avoid being screened in the metal detectors.  Ask the security personnel to use the hand wand. - Avoid arc welding equipment, MRI testing (magnetic resonance imaging), TENS units (transcutaneous nerve stimulators).  Call the office for questions about other devices. - Avoid electrical appliances that are in poor condition or are not properly grounded. - Microwave ovens are safe to be near or to operate.  Additional information for defibrillator patients should your device go off: - If your device goes off ONCE and you feel fine afterward, notify the device clinic nurses. - If your device goes off ONCE and you do not feel well afterward, call 911. - If your device  goes off TWICE, call 911. - If your device goes off THREE times in one day, call 911.  DO NOT DRIVE YOURSELF OR A FAMILY MEMBER WITH A DEFIBRILLATOR TO THE HOSPITAL--CALL 911.

## 2017-11-05 ENCOUNTER — Other Ambulatory Visit (HOSPITAL_COMMUNITY): Payer: Medicare Other

## 2017-11-05 ENCOUNTER — Encounter (HOSPITAL_COMMUNITY): Payer: Self-pay | Admitting: Internal Medicine

## 2017-11-05 ENCOUNTER — Ambulatory Visit (HOSPITAL_COMMUNITY): Payer: Medicare Other

## 2017-11-05 ENCOUNTER — Ambulatory Visit (HOSPITAL_BASED_OUTPATIENT_CLINIC_OR_DEPARTMENT_OTHER): Payer: Medicare Other

## 2017-11-05 DIAGNOSIS — H532 Diplopia: Secondary | ICD-10-CM | POA: Diagnosis not present

## 2017-11-05 DIAGNOSIS — I083 Combined rheumatic disorders of mitral, aortic and tricuspid valves: Secondary | ICD-10-CM | POA: Diagnosis not present

## 2017-11-05 DIAGNOSIS — I6782 Cerebral ischemia: Secondary | ICD-10-CM | POA: Diagnosis not present

## 2017-11-05 DIAGNOSIS — I5030 Unspecified diastolic (congestive) heart failure: Secondary | ICD-10-CM | POA: Diagnosis not present

## 2017-11-05 DIAGNOSIS — I361 Nonrheumatic tricuspid (valve) insufficiency: Secondary | ICD-10-CM | POA: Diagnosis not present

## 2017-11-05 DIAGNOSIS — I420 Dilated cardiomyopathy: Secondary | ICD-10-CM | POA: Diagnosis not present

## 2017-11-05 DIAGNOSIS — G319 Degenerative disease of nervous system, unspecified: Secondary | ICD-10-CM | POA: Diagnosis not present

## 2017-11-05 DIAGNOSIS — T82118A Breakdown (mechanical) of other cardiac electronic device, initial encounter: Secondary | ICD-10-CM | POA: Diagnosis not present

## 2017-11-05 DIAGNOSIS — K449 Diaphragmatic hernia without obstruction or gangrene: Secondary | ICD-10-CM | POA: Diagnosis not present

## 2017-11-05 LAB — ECHOCARDIOGRAM COMPLETE
HEIGHTINCHES: 72 in
WEIGHTICAEL: 2228.8 [oz_av]

## 2017-11-05 LAB — GLUCOSE, CAPILLARY
Glucose-Capillary: 105 mg/dL — ABNORMAL HIGH (ref 70–99)
Glucose-Capillary: 117 mg/dL — ABNORMAL HIGH (ref 70–99)

## 2017-11-05 MED ORDER — PERFLUTREN LIPID MICROSPHERE
1.0000 mL | INTRAVENOUS | Status: AC | PRN
  Administered 2017-11-05 (×2): 2 mL via INTRAVENOUS
  Filled 2017-11-05: qty 10

## 2017-11-05 NOTE — Interval H&P Note (Signed)
History and Physical Interval Note:  11/05/2017 6:39 PM  Richard Moody  has presented today for surgery, with the diagnosis of atrial lead  The various methods of treatment have been discussed with the patient and family. After consideration of risks, benefits and other options for treatment, the patient has consented to  Procedure(s): LEAD REVISION/REPAIR (N/A) as a surgical intervention .  The patient's history has been reviewed, patient examined, no change in status, stable for surgery.  I have reviewed the patient's chart and labs.  Questions were answered to the patient's satisfaction.   Seen and examined and plan reviewed  Venogram and then revision   Virl Axe

## 2017-11-05 NOTE — Progress Notes (Signed)
Subjective: Still reports some diploplia with distance  Exam: Vitals:   11/05/17 0817 11/05/17 0831  BP:  114/67  Pulse:    Resp:  15  Temp: 98.5 F (36.9 C)   SpO2:  96%   Gen: In bed, NAD Resp: non-labored breathing, no acute distress Abd: soft, nt  Neuro: MS: awake, alert TA:EWYBR, no clear dysconjugate gaze, but reports diploplia at distance. VFF Motor: MAEW, some limitation in left arm extension due to pain.  Sensory:intact to LT  Pertinent Labs: LDL 84  Impression: 84 yo M with new diploplia after procedure. Etiology is unclear, but stroke is one possibility. If it were stroke, would use antiplatelet therapy, cholesterol control similar to ischemic heart disease. No evidence on telemetry for anticoagulation, echo is pending.   Recommendations: 1) Continue antiplatelet therapy.  2) LDL management per cardiology 3) echo, if no clear indication for anticoagulation on echo, then no further recommendations.  4) no need for rehab evals from neuro perspective as deficits are mild.   Roland Rack, MD [facility] [phone]  If 7pm- 7am, please page neurology on call as listed in [place].

## 2017-11-05 NOTE — Progress Notes (Addendum)
  Echocardiogram 2D Echocardiogram has been performed with Definity.  Richard Moody 11/05/2017, 12:57 PM

## 2017-11-05 NOTE — Progress Notes (Signed)
Pt ambulated in the hall way with no problems. Pt and wife state they are ready for discharge. Discharge instructions reviewed with pt and wife and they have no questions at this time.

## 2017-11-09 MED FILL — Heparin Sod (Porcine)-NaCl IV Soln 1000 Unit/500ML-0.9%: INTRAVENOUS | Qty: 500 | Status: AC

## 2017-11-18 ENCOUNTER — Ambulatory Visit (INDEPENDENT_AMBULATORY_CARE_PROVIDER_SITE_OTHER): Payer: Medicare Other | Admitting: *Deleted

## 2017-11-18 DIAGNOSIS — I495 Sick sinus syndrome: Secondary | ICD-10-CM | POA: Diagnosis not present

## 2017-11-18 LAB — CUP PACEART INCLINIC DEVICE CHECK
Brady Statistic AP VS Percent: 95.53 %
Brady Statistic AS VP Percent: 0.08 %
Brady Statistic RA Percent Paced: 98.02 %
Brady Statistic RV Percent Paced: 1.24 %
Date Time Interrogation Session: 20190918142831
Implantable Lead Implant Date: 20190904
Implantable Lead Location: 753859
Implantable Lead Location: 753860
Implantable Lead Model: 5076
Implantable Pulse Generator Implant Date: 20180625
Lead Channel Impedance Value: 323 Ohm
Lead Channel Impedance Value: 361 Ohm
Lead Channel Pacing Threshold Pulse Width: 0.4 ms
Lead Channel Sensing Intrinsic Amplitude: 10.125 mV
Lead Channel Sensing Intrinsic Amplitude: 9.75 mV
Lead Channel Setting Pacing Amplitude: 2.5 V
Lead Channel Setting Pacing Amplitude: 3.5 V
Lead Channel Setting Pacing Pulse Width: 0.4 ms
Lead Channel Setting Sensing Sensitivity: 2.8 mV
MDC IDC LEAD IMPLANT DT: 20100805
MDC IDC MSMT BATTERY REMAINING LONGEVITY: 96 mo
MDC IDC MSMT BATTERY VOLTAGE: 3.02 V
MDC IDC MSMT LEADCHNL RA IMPEDANCE VALUE: 304 Ohm
MDC IDC MSMT LEADCHNL RA IMPEDANCE VALUE: 418 Ohm
MDC IDC MSMT LEADCHNL RA PACING THRESHOLD AMPLITUDE: 0.5 V
MDC IDC MSMT LEADCHNL RA SENSING INTR AMPL: 2 mV
MDC IDC MSMT LEADCHNL RA SENSING INTR AMPL: 2.25 mV
MDC IDC MSMT LEADCHNL RV PACING THRESHOLD AMPLITUDE: 1 V
MDC IDC MSMT LEADCHNL RV PACING THRESHOLD PULSEWIDTH: 0.4 ms
MDC IDC STAT BRADY AP VP PERCENT: 1.16 %
MDC IDC STAT BRADY AS VS PERCENT: 3.23 %

## 2017-11-18 NOTE — Progress Notes (Signed)
Wound check appointment. Dermabond removed. Wound without redness or edema. Incision edges approximated, wound well healed. Normal device function. Thresholds, sensing, and impedances consistent with implant measurements. Device programmed at 3.5V (RA) for extra safety margin until 3 month visit. RV programmed to appropriate safety margin. Histogram distribution appropriate for patient and level of activity. No mode switches or high ventricular rates noted. Patient educated about wound care, arm mobility, lifting restrictions. ROV in 3 months with SK.

## 2017-11-20 ENCOUNTER — Other Ambulatory Visit: Payer: Self-pay | Admitting: Internal Medicine

## 2017-11-20 MED ORDER — PREDNISONE 20 MG PO TABS: ORAL_TABLET | ORAL | 0 refills | Status: DC

## 2017-11-26 ENCOUNTER — Ambulatory Visit (INDEPENDENT_AMBULATORY_CARE_PROVIDER_SITE_OTHER): Payer: Medicare Other | Admitting: *Deleted

## 2017-11-26 DIAGNOSIS — I495 Sick sinus syndrome: Secondary | ICD-10-CM | POA: Diagnosis not present

## 2017-11-26 NOTE — Progress Notes (Signed)
Remote pacemaker transmission.   

## 2017-11-27 ENCOUNTER — Encounter: Payer: Self-pay | Admitting: Cardiology

## 2017-12-16 ENCOUNTER — Encounter: Payer: Self-pay | Admitting: Internal Medicine

## 2017-12-16 NOTE — Progress Notes (Signed)
[facility]   Unk Pinto, M.D.     [name]. Silverio Lay, P.A.-C Liane Comber, [place]                [address] [place] [address] Telephone [phone] Telefax [phone]  Comprehensive Evaluation & Examination     This very nice 84 y.o. MWM presents for a comprehensive evaluation and management of multiple medical co-morbidities.  Patient has been followed for HTN, ASHD/PPM, HLD, Prediabetes and Vitamin D Deficiency. Patient has mild Dementia.     In May, patient had resection of a verrucous left tongue cancer.     HTN predates since 2003. Patient's BP has been controlled at home.  Today's BP is at goal - 120/52. Patient underwent CABG in 1992. In 2010, he had a PPM/AICD by Dr Caryl Comes, later changed out in June 2018 to a PPM.  Patient has Hx/o congestive cardiomyopathy with EF of 35%.  Patient denies any cardiac symptoms as chest pain, palpitations, shortness of breath, dizziness or ankle swelling.     Patient's hyperlipidemia is controlled with diet and medications. Patient denies myalgias or other medication SE's. Last lipids were at goal: Lab Results  Component Value Date   CHOL 154 11/04/2017   HDL 57 11/04/2017   LDLCALC 84 11/04/2017   TRIG 63 11/04/2017   CHOLHDL 2.7 11/04/2017      Patient has hx/o prediabetes (A1c 5.8%/2011) and since that time he has lost about 20#.  [place] reactive hypoglycemic symptoms, visual blurring, diabetic polys or paresthesias. Last A1c was not at goal: Lab Results  Component Value Date   HGBA1C 5.7 (H) 11/04/2017       Finally, patient has history of Vitamin D Deficiency ("22"/2008)  and last vitamin D was at goal: Lab Results  Component Value Date   VD25OH 95 06/15/2017   Current Outpatient Medications on File Prior to Visit  Medication Sig  . acetaminophen (TYLENOL) 500 MG tablet Take 500 mg by mouth 2 (two) times daily as needed  for moderate pain.   Marland Kitchen amiodarone (PACERONE) 200 MG tablet Take 1 tablet (200 mg total) by mouth daily.  . Ascorbic Acid (VITAMIN C) 1000 MG tablet Take 1,000 mg by mouth daily.  Marland Kitchen aspirin 81 MG tablet Take 81 mg by mouth every Monday, Wednesday, and Friday.   . Calcium Carb-Cholecalciferol (CALCIUM 600 + D PO) Take 1 tablet by mouth daily.  . Cholecalciferol (VITAMIN D3) 5000 UNITS CAPS Take 5,000-10,000 Units by mouth See admin instructions. Take 10,000 IU on M, W, F and 5000 IU all other days  . diphenhydrAMINE (BENADRYL) 25 MG tablet Take 25 mg by mouth at bedtime as needed for sleep.   Marland Kitchen docusate sodium (COLACE) 100 MG capsule Take 100 mg by mouth daily as needed for mild constipation.  . fexofenadine (ALLEGRA) 180 MG tablet Take 180 mg by mouth daily as needed for allergies.   . halobetasol (ULTRAVATE) 0.05 % cream Apply 1 application topically daily as needed (itching).  . Magnesium 250 MG TABS Take 250 mg by mouth 2 (two) times daily.   . metoprolol succinate (TOPROL-XL) 25 MG 24 hr tablet Take 0.5 tablets (12.5 mg total) by mouth daily with supper.  . Omega-3 Fatty Acids (FISH OIL PO) Take 1 capsule by mouth at bedtime.  [name] (SYSTANE OP) Apply 1 drop to  eye daily as needed (dry eyes).  . polyethylene glycol (MIRALAX / GLYCOLAX) packet Take 17 g by mouth as needed for moderate constipation.    No current facility-administered medications on file prior to visit.    Allergies  Allergen Reactions  . Atorvastatin     Elevated cpk   . Meloxicam Other (See Comments)    edema  . Nsaids Other (See Comments)    dyspepsia    Past Medical History:  Diagnosis Date  . Essential hypertension   . Ganglion cyst of wrist    LEFT WRIST  . GERD (gastroesophageal reflux disease)   . Hx of CABG 1992  . Hyperlipidemia   . ICD (implantable cardiac defibrillator) in place August 2010  . Ischemic cardiomyopathy    EF 32%  . MI, old    INFERIOR LATERAL  . Normal  nuclear stress test June 2010   No ischemia. EF 32%  . OA (osteoarthritis) of knee   . Pre-diabetes   . S/P cardiac cath 2006   Occluded SVG to OM. Managed medically  . Vitamin D deficiency    Health Maintenance  Topic Date Due  . INFLUENZA VACCINE  10/01/2017  . TETANUS/TDAP  10/14/2025  . PNA vac Low Risk Adult  Completed   Immunization History  Administered Date(s) Administered  . Influenza, High Dose Seasonal PF 12/07/2013, 01/04/2015  . Influenza-Unspecified 01/01/2013  . Pneumococcal Conjugate-13 07/30/2016  . Pneumococcal Polysaccharide-23 07/02/2001, 06/01/2013  . Td 10/15/2015  . Tdap 03/04/2003   Last Colon -  Past Surgical History:  Procedure Laterality Date  . CARDIAC CATHETERIZATION  10/14/2004   THERE IS POSTERIOR LATERAL HYPOKINESIA. EF 30-35%  . CARDIAC CATHETERIZATION  2006   OCCLUDED SVG TO OM  . CARDIAC DEFIBRILLATOR PLACEMENT  10/2008  . CORONARY ARTERY BYPASS GRAFT  1992  . INGUINAL HERNIA REPAIR    . KNEE SURGERY     RIGHT KNEE  . LEAD REVISION/REPAIR  11/04/2017  . LEAD REVISION/REPAIR N/A 11/04/2017   Procedure: LEAD REVISION/REPAIR;  Surgeon: Deboraha Sprang, MD;  Location: [facility];  Service: Cardiovascular;  Laterality: N/A;  . NUCLEAR STRESS TEST  2010   EF 32%. No ischemia  . PPM GENERATOR CHANGEOUT N/A 08/25/2016   Procedure: PPM Generator Changeout;  Surgeon: Deboraha Sprang, MD;  Location: [facility];  Service: Cardiovascular;  Laterality: N/A;   Family History  Problem Relation Age of Onset  . Cancer Mother        ABDOMINAL CANCER  . Hypertension Father   . Stroke Father   . Breast cancer Sister    Social History   Socioeconomic History  . Marital status: Married    Spouse name: Judson Roch  . Number of children: None  Occupational History  . Retired   Tobacco Use  . Smoking status: Former Smoker    Packs/day: 1.00    Years: 15.00    Pack years: 15.00    Last attempt to quit: 03/03/1982    Years since quitting:  35.8  . Smokeless tobacco: Never Used  Substance and Sexual Activity  . Alcohol use: No  . Drug use: No  . Sexual activity: Not on file    ROS Constitutional: Denies fever, chills, weight loss/gain, headaches, insomnia,  night sweats or change in appetite. Does c/o fatigue. Eyes: Denies redness, blurred vision, diplopia, discharge, itchy or watery eyes.  ENT: Denies discharge, congestion, post nasal drip, epistaxis, sore throat, earache, hearing loss, dental pain, Tinnitus, Vertigo, Sinus  pain or snoring.  Cardio: Denies chest pain, palpitations, irregular heartbeat, syncope, dyspnea, diaphoresis, orthopnea, PND, claudication or edema Respiratory: denies cough, dyspnea, DOE, pleurisy, hoarseness, laryngitis or wheezing.  Gastrointestinal: Denies dysphagia, heartburn, reflux, water brash, pain, cramps, nausea, vomiting, bloating, diarrhea, constipation, hematemesis, melena, hematochezia, jaundice or hemorrhoids Genitourinary: Denies dysuria, frequency, discharge, hematuria or flank pain. Has urgency, nocturia x 2-3 & occasional hesitancy. Musculoskeletal: Denies arthralgia, myalgia, stiffness, Jt. Swelling, pain, limp or strain/sprain. Denies Falls. Skin: Denies puritis, rash, hives, warts, acne, eczema or change in skin lesion Neuro: No weakness, tremor, incoordination, spasms, paresthesia or pain Psychiatric: Denies confusion, memory loss or sensory loss. Denies Depression. Endocrine: Denies change in weight, skin, hair change, nocturia, and paresthesia, diabetic polys, visual blurring or hyper / hypo glycemic episodes.  Heme/Lymph: No excessive bleeding, bruising or enlarged lymph nodes.  Physical Exam  BP (!) 120/52   Pulse (!) 44   Temp 97.9 F (36.6 C)   Resp 16   Ht 5\' 10"  (1.778 m)   Wt 139 lb (63 kg)   BMI 19.94 kg/m   General Appearance: Well nourished and well groomed and in no apparent distress.  Eyes: PERRLA, EOMs, conjunctiva no swelling or erythema, normal fundi and  vessels. Sinuses: No frontal/maxillary tenderness ENT/Mouth: EACs patent / TMs  Nl. Poor hearing w/ amplification.  Nares clear without erythema, swelling, mucoid exudates. Oral hygiene is good. No erythema, swelling, or exudate. Tongue normal, non-obstructing. Tonsils not swollen or erythematous. Hearing normal.  Neck: Supple, thyroid not palpable. No bruits, nodes or JVD. Respiratory: Respiratory effort normal.  BS distant and clear bilateral without rales, rhonci, wheezing or stridor. Cardio: Heart sounds are normal with regular rate and rhythm and no murmurs, rubs or gallops. Peripheral pulses are normal and equal bilaterally without edema. No aortic or femoral bruits. Chest: Barrel chested. Symmetric with normal excursions and percussion.  Abdomen: Soft, with Nl bowel sounds. Nontender, no guarding, rebound, hernias, masses, or organomegaly.  Lymphatics: Non tender without lymphadenopathy.  Genitourinary:  DRE - deferred for age Musculoskeletal: Full ROM all peripheral extremities, joint stability, 5/5 strength, and normal gait. Skin: Warm and dry without rashes, lesions, cyanosis, clubbing or  ecchymosis.  Neuro: Cranial nerves intact, reflexes equal bilaterally. Normal muscle tone, no cerebellar symptoms. Sensation intact.  Pysch: Alert and oriented X 3 with normal affect.  Poor short term recall. Insight and judgment loimited.   Assessment and Plan  1. Essential hypertension  - [place], RETROPERITNL ABD,  LTD - Urinalysis, Routine w reflex microscopic - Microalbumin / creatinine urine ratio - CBC with Differential/Platelet - COMPLETE METABOLIC PANEL WITH GFR - Magnesium - TSH  2. Hyperlipidemia, mixed  - [place], RETROPERITNL ABD,  LTD - Lipid panel - TSH  3. Autonomic postural hypotension  - COMPLETE METABOLIC PANEL WITH GFR  4. CKD (chronic kidney disease), stage III (HCC)  - COMPLETE METABOLIC PANEL WITH GFR  5. Chronic combined systolic and diastolic congestive heart  failure ([place])  6. ASCAD s/p CABG (1992)  7. Gastroesophageal reflux disease, esophagitis presence not specified  - CBC with Differential/Platelet  8. Automatic implantable cardioverter-defibrillator in situ   9. Aortic atherosclerosis ([place])   10. Abdominal aortic aneurysm (AAA) without rupture ([place])   11. BPH with obstruction/lower urinary tract symptoms  - PSA  12. Vitamin D deficiency  - VITAMIN D 25 Hydroxyl  13. Abnormal glucose  - [place], RETROPERITNL ABD,  LTD - Hemoglobin A1c - Insulin, random  14. Prediabetes  - Hemoglobin A1c - Insulin,  random  15. Screening for colorectal cancer  - POC Hemoccult Bld/Stl  16. Prostate cancer screening  - PSA  17. Screening for AAA (aortic abdominal aneurysm)  - [place], RETROPERITNL ABD,  LTD  18. FHx: heart disease  - [place], RETROPERITNL ABD,  LTD  19. Former smoker  - [place], RETROPERITNL ABD,  [place]  20. Need for prophylactic vaccination and inoculation against influenza  - Flu vaccine HIGH DOSE PF (Fluzone High dose)  21. Acute cystitis without hematuria  - Urine Culture  22. Medication management  - Urinalysis, Routine w reflex microscopic - Microalbumin / creatinine urine ratio - CBC with Differential/Platelet - COMPLETE METABOLIC PANEL WITH GFR - Magnesium - Lipid panel - TSH - Hemoglobin A1c - Insulin, random - VITAMIN D 25 Hydroxyl       Patient was counseled in prudent diet, weight control to achieve/maintain BMI less than 25, BP monitoring, regular exercise and medications as discussed.  Discussed med effects and SE's. Routine screening labs and tests as requested with regular follow-up as recommended. Over 40 minutes of exam, counseling, chart review and high complex critical decision making was performed

## 2017-12-16 NOTE — Patient Instructions (Signed)

## 2017-12-17 ENCOUNTER — Ambulatory Visit (INDEPENDENT_AMBULATORY_CARE_PROVIDER_SITE_OTHER): Payer: Medicare Other | Admitting: Internal Medicine

## 2017-12-17 ENCOUNTER — Encounter: Payer: Self-pay | Admitting: Internal Medicine

## 2017-12-17 VITALS — BP 120/52 | HR 44 | Temp 97.9°F | Resp 16 | Ht 70.0 in | Wt 139.0 lb

## 2017-12-17 DIAGNOSIS — Z8249 Family history of ischemic heart disease and other diseases of the circulatory system: Secondary | ICD-10-CM

## 2017-12-17 DIAGNOSIS — K219 Gastro-esophageal reflux disease without esophagitis: Secondary | ICD-10-CM

## 2017-12-17 DIAGNOSIS — Z125 Encounter for screening for malignant neoplasm of prostate: Secondary | ICD-10-CM

## 2017-12-17 DIAGNOSIS — Z79899 Other long term (current) drug therapy: Secondary | ICD-10-CM | POA: Diagnosis not present

## 2017-12-17 DIAGNOSIS — R7309 Other abnormal glucose: Secondary | ICD-10-CM | POA: Diagnosis not present

## 2017-12-17 DIAGNOSIS — Z136 Encounter for screening for cardiovascular disorders: Secondary | ICD-10-CM

## 2017-12-17 DIAGNOSIS — R7303 Prediabetes: Secondary | ICD-10-CM | POA: Diagnosis not present

## 2017-12-17 DIAGNOSIS — I255 Ischemic cardiomyopathy: Secondary | ICD-10-CM

## 2017-12-17 DIAGNOSIS — I951 Orthostatic hypotension: Secondary | ICD-10-CM

## 2017-12-17 DIAGNOSIS — N183 Chronic kidney disease, stage 3 unspecified: Secondary | ICD-10-CM

## 2017-12-17 DIAGNOSIS — N3000 Acute cystitis without hematuria: Secondary | ICD-10-CM

## 2017-12-17 DIAGNOSIS — Z1212 Encounter for screening for malignant neoplasm of rectum: Secondary | ICD-10-CM

## 2017-12-17 DIAGNOSIS — N401 Enlarged prostate with lower urinary tract symptoms: Secondary | ICD-10-CM

## 2017-12-17 DIAGNOSIS — N138 Other obstructive and reflux uropathy: Secondary | ICD-10-CM | POA: Diagnosis not present

## 2017-12-17 DIAGNOSIS — I5042 Chronic combined systolic (congestive) and diastolic (congestive) heart failure: Secondary | ICD-10-CM

## 2017-12-17 DIAGNOSIS — E559 Vitamin D deficiency, unspecified: Secondary | ICD-10-CM

## 2017-12-17 DIAGNOSIS — I10 Essential (primary) hypertension: Secondary | ICD-10-CM

## 2017-12-17 DIAGNOSIS — E782 Mixed hyperlipidemia: Secondary | ICD-10-CM | POA: Diagnosis not present

## 2017-12-17 DIAGNOSIS — I714 Abdominal aortic aneurysm, without rupture, unspecified: Secondary | ICD-10-CM

## 2017-12-17 DIAGNOSIS — Z23 Encounter for immunization: Secondary | ICD-10-CM

## 2017-12-17 DIAGNOSIS — I700 Atherosclerosis of aorta: Secondary | ICD-10-CM | POA: Diagnosis not present

## 2017-12-17 DIAGNOSIS — Z1211 Encounter for screening for malignant neoplasm of colon: Secondary | ICD-10-CM

## 2017-12-17 DIAGNOSIS — Z87891 Personal history of nicotine dependence: Secondary | ICD-10-CM

## 2017-12-17 DIAGNOSIS — Z95810 Presence of automatic (implantable) cardiac defibrillator: Secondary | ICD-10-CM

## 2017-12-18 ENCOUNTER — Other Ambulatory Visit: Payer: Self-pay | Admitting: Internal Medicine

## 2017-12-18 DIAGNOSIS — N3000 Acute cystitis without hematuria: Secondary | ICD-10-CM

## 2017-12-18 LAB — COMPLETE METABOLIC PANEL WITH GFR
AG Ratio: 1.6 (calc) (ref 1.0–2.5)
ALT: 9 U/L (ref 9–46)
AST: 14 U/L (ref 10–35)
Albumin: 4.1 g/dL (ref 3.6–5.1)
Alkaline phosphatase (APISO): 96 U/L (ref 40–115)
BUN/Creatinine Ratio: 24 (calc) — ABNORMAL HIGH (ref 6–22)
BUN: 30 mg/dL — AB (ref 7–25)
CALCIUM: 9.6 mg/dL (ref 8.6–10.3)
CO2: 29 mmol/L (ref 20–32)
Chloride: 101 mmol/L (ref 98–110)
Creat: 1.27 mg/dL — ABNORMAL HIGH (ref 0.70–1.11)
GFR, EST AFRICAN AMERICAN: 58 mL/min/{1.73_m2} — AB (ref >=60)
GFR, EST NON AFRICAN AMERICAN: 50 mL/min/{1.73_m2} — AB (ref >=60)
GLUCOSE: 92 mg/dL (ref 65–99)
Globulin: 2.5 g/dL (calc) (ref 1.9–3.7)
POTASSIUM: 5.4 mmol/L — AB (ref 3.5–5.3)
Sodium: 138 mmol/L (ref 135–146)
TOTAL PROTEIN: 6.6 g/dL (ref 6.1–8.1)
Total Bilirubin: 0.6 mg/dL (ref 0.2–1.2)

## 2017-12-18 LAB — CBC WITH DIFFERENTIAL/PLATELET
BASOS ABS: 50 {cells}/uL (ref 0–200)
BASOS PCT: 1 %
EOS ABS: 280 {cells}/uL (ref 15–500)
EOS PCT: 5.6 %
HCT: 33.1 % — ABNORMAL LOW (ref 38.5–50.0)
Hemoglobin: 11.2 g/dL — ABNORMAL LOW (ref 13.2–17.1)
LYMPHS ABS: 1015 {cells}/uL (ref 850–3900)
MCH: 31.6 pg (ref 27.0–33.0)
MCHC: 33.8 g/dL (ref 32.0–36.0)
MCV: 93.5 fL (ref 80.0–100.0)
MPV: 11.2 fL (ref 7.5–12.5)
Monocytes Relative: 12.5 %
NEUTROS ABS: 3030 {cells}/uL (ref 1500–7800)
Neutrophils Relative %: 60.6 %
PLATELETS: 253 10*3/uL (ref 140–400)
RBC: 3.54 10*6/uL — ABNORMAL LOW (ref 4.20–5.80)
RDW: 12.7 % (ref 11.0–15.0)
Total Lymphocyte: 20.3 %
WBC mixed population: 625 cells/uL (ref 200–950)
WBC: 5 10*3/uL (ref 3.8–10.8)

## 2017-12-18 LAB — URINALYSIS, ROUTINE W REFLEX MICROSCOPIC
BILIRUBIN URINE: NEGATIVE
Bacteria, UA: NONE SEEN /HPF
Glucose, UA: NEGATIVE
Hgb urine dipstick: NEGATIVE
Hyaline Cast: NONE SEEN /LPF
Ketones, ur: NEGATIVE
Nitrite: NEGATIVE
PROTEIN: NEGATIVE
RBC / HPF: NONE SEEN /HPF (ref 0–2)
SPECIFIC GRAVITY, URINE: 1.022 (ref 1.001–1.03)
Squamous Epithelial / LPF: NONE SEEN /HPF (ref <=5)
pH: 5.5 (ref 5.0–8.0)

## 2017-12-18 LAB — HEMOGLOBIN A1C
Hgb A1c MFr Bld: 5.7 % of total Hgb — ABNORMAL HIGH (ref <5.7)
Mean Plasma Glucose: 117 (calc)
eAG (mmol/L): 6.5 (calc)

## 2017-12-18 LAB — LIPID PANEL
CHOL/HDL RATIO: 2.7 (calc) (ref <5.0)
CHOLESTEROL: 194 mg/dL (ref <200)
HDL: 71 mg/dL (ref >40)
LDL CHOLESTEROL (CALC): 102 mg/dL — AB
NON-HDL CHOLESTEROL (CALC): 123 mg/dL (ref <130)
Triglycerides: 111 mg/dL (ref <150)

## 2017-12-18 LAB — MICROALBUMIN / CREATININE URINE RATIO
Creatinine, Urine: 121 mg/dL (ref 20–320)
Microalb Creat Ratio: 12 mcg/mg creat (ref <30)
Microalb, Ur: 1.5 mg/dL

## 2017-12-18 LAB — VITAMIN D 25 HYDROXY (VIT D DEFICIENCY, FRACTURES): Vit D, 25-Hydroxy: 87 ng/mL (ref 30–100)

## 2017-12-18 LAB — MAGNESIUM: Magnesium: 2.5 mg/dL (ref 1.5–2.5)

## 2017-12-18 LAB — TSH: TSH: 4.39 m[IU]/L (ref 0.40–4.50)

## 2017-12-18 LAB — PSA

## 2017-12-18 LAB — INSULIN, RANDOM: Insulin: 2.6 u[IU]/mL (ref 2.0–19.6)

## 2017-12-20 ENCOUNTER — Encounter: Payer: Self-pay | Admitting: Internal Medicine

## 2017-12-21 ENCOUNTER — Other Ambulatory Visit: Payer: Self-pay | Admitting: Internal Medicine

## 2017-12-21 DIAGNOSIS — N3000 Acute cystitis without hematuria: Secondary | ICD-10-CM

## 2017-12-21 LAB — URINE CULTURE
MICRO NUMBER:: 91257244
SPECIMEN QUALITY:: ADEQUATE

## 2017-12-21 MED ORDER — LEVOFLOXACIN 500 MG PO TABS: ORAL_TABLET | ORAL | 0 refills | Status: DC

## 2018-01-05 LAB — CUP PACEART REMOTE DEVICE CHECK
Battery Remaining Longevity: 96 mo
Brady Statistic AP VS Percent: 83.27 %
Brady Statistic RA Percent Paced: 89.44 %
Date Time Interrogation Session: 20190926053656
Implantable Lead Implant Date: 20100805
Implantable Lead Implant Date: 20190904
Implantable Lead Location: 753860
Implantable Lead Model: 5076
Implantable Lead Model: 6947
Implantable Pulse Generator Implant Date: 20180625
Lead Channel Impedance Value: 285 Ohm
Lead Channel Pacing Threshold Pulse Width: 0.4 ms
Lead Channel Pacing Threshold Pulse Width: 0.4 ms
Lead Channel Sensing Intrinsic Amplitude: 2.5 mV
Lead Channel Sensing Intrinsic Amplitude: 2.5 mV
Lead Channel Sensing Intrinsic Amplitude: 8.875 mV
Lead Channel Setting Pacing Pulse Width: 0.4 ms
MDC IDC LEAD LOCATION: 753859
MDC IDC MSMT BATTERY VOLTAGE: 3.03 V
MDC IDC MSMT LEADCHNL RA IMPEDANCE VALUE: 418 Ohm
MDC IDC MSMT LEADCHNL RA PACING THRESHOLD AMPLITUDE: 0.75 V
MDC IDC MSMT LEADCHNL RV IMPEDANCE VALUE: 304 Ohm
MDC IDC MSMT LEADCHNL RV IMPEDANCE VALUE: 342 Ohm
MDC IDC MSMT LEADCHNL RV PACING THRESHOLD AMPLITUDE: 0.875 V
MDC IDC MSMT LEADCHNL RV SENSING INTR AMPL: 8.875 mV
MDC IDC SET LEADCHNL RA PACING AMPLITUDE: 3.5 V
MDC IDC SET LEADCHNL RV PACING AMPLITUDE: 2.5 V
MDC IDC SET LEADCHNL RV SENSING SENSITIVITY: 2.8 mV
MDC IDC STAT BRADY AP VP PERCENT: 1.6 %
MDC IDC STAT BRADY AS VP PERCENT: 0.81 %
MDC IDC STAT BRADY AS VS PERCENT: 14.32 %
MDC IDC STAT BRADY RV PERCENT PACED: 2.41 %

## 2018-01-11 ENCOUNTER — Telehealth: Payer: Self-pay | Admitting: Internal Medicine

## 2018-01-11 NOTE — Telephone Encounter (Signed)
New Message        [facility] is calling today to get a clarification on "Pacerone200 mg" Pls call and advise

## 2018-01-11 NOTE — Telephone Encounter (Signed)
Spoke with [employer] from [employer].  He is calling to ask if patient should continue on pacerone since he was started on Levaquin, which in combination can prolong QT.  Advised they would need to contact the MD who ordered the Levaquin to change antibiotic if necessary.

## 2018-02-03 ENCOUNTER — Other Ambulatory Visit: Payer: Medicare Other

## 2018-02-03 DIAGNOSIS — N3000 Acute cystitis without hematuria: Secondary | ICD-10-CM

## 2018-02-04 ENCOUNTER — Encounter: Payer: Self-pay | Admitting: Internal Medicine

## 2018-02-04 ENCOUNTER — Ambulatory Visit (INDEPENDENT_AMBULATORY_CARE_PROVIDER_SITE_OTHER): Payer: Medicare Other | Admitting: Internal Medicine

## 2018-02-04 VITALS — BP 114/60 | HR 72 | Ht 70.0 in | Wt 143.0 lb

## 2018-02-04 DIAGNOSIS — Z95810 Presence of automatic (implantable) cardiac defibrillator: Secondary | ICD-10-CM | POA: Diagnosis not present

## 2018-02-04 DIAGNOSIS — I255 Ischemic cardiomyopathy: Secondary | ICD-10-CM

## 2018-02-04 DIAGNOSIS — I495 Sick sinus syndrome: Secondary | ICD-10-CM | POA: Diagnosis not present

## 2018-02-04 DIAGNOSIS — I5022 Chronic systolic (congestive) heart failure: Secondary | ICD-10-CM | POA: Diagnosis not present

## 2018-02-04 LAB — URINALYSIS, ROUTINE W REFLEX MICROSCOPIC
BILIRUBIN URINE: NEGATIVE
Glucose, UA: NEGATIVE
Hgb urine dipstick: NEGATIVE
Ketones, ur: NEGATIVE
LEUKOCYTES UA: NEGATIVE
NITRITE: NEGATIVE
PROTEIN: NEGATIVE
SPECIFIC GRAVITY, URINE: 1.02 (ref 1.001–1.03)
pH: 7.5 (ref 5.0–8.0)

## 2018-02-04 LAB — URINE CULTURE
MICRO NUMBER:: 91453657
RESULT: NO GROWTH
SPECIMEN QUALITY:: ADEQUATE

## 2018-02-04 NOTE — Patient Instructions (Addendum)
Medication Instructions:  Your physician recommends that you continue on your current medications as directed. Please refer to the Current Medication list given to you today.  Labwork: You will have labs drawn today: BMP, TSH  Testing/Procedures: None ordered.  Follow-Up: Your physician recommends that you schedule a follow-up appointment in:   9 months with [name], NP  Remote monitoring is used to monitor your Pacemaker of ICD from home. This monitoring reduces the number of office visits required to check your device to one time per year. It allows [place] to keep an eye on the functioning of your device to ensure it is working properly. You are scheduled for a device check from home on 3/5. You may send your transmission at any time that day. If you have a wireless device, the transmission will be sent automatically. After your physician reviews your transmission, you will receive a postcard with your next transmission date.    Any Other Special Instructions Will Be Listed Below (If Applicable).     If you need a refill on your cardiac medications before your next appointment, please call your pharmacy.

## 2018-02-04 NOTE — Progress Notes (Signed)
Patient Care Team: Unk Pinto, MD as PCP - General (Internal Medicine) Unk Pinto, MD (Internal Medicine) Darleen Crocker, MD as Consulting Physician (Ophthalmology) Deboraha Sprang, MD as Consulting Physician (Cardiology)   HPI  Richard Moody is a 84 y.o. male seen in followup for ICD implanted in 2010. He has sinus node dysfunction and is atrial pacing.  Underwent ICD downgrade 6/18.  He presented 8/19 with atrial lead failure and underwent lead revision.  The procedure was complicated by double vision in order.  Was seen by neurology.  No stroke was identified.   7/16 Echo   35-40 %   9/19 Echo  30-35%          Date TSH LFTs K Cr  8/17  3.8 13     11  /17  4.26 15     5/18 3.09 11  1.2  9/18 3.24 12  1.12  1/19 2.75 9    10/19 4.39 9 5.4 1.27           No chest pain or sob  Some edema uses compressive stockings  A lot of fatigue and sleepy     Past Medical History:  Diagnosis Date  . Essential hypertension   . Ganglion cyst of wrist    LEFT WRIST  . GERD (gastroesophageal reflux disease)   . Hx of CABG 1992  . Hyperlipidemia   . ICD (implantable cardiac defibrillator) in place August 2010  . Ischemic cardiomyopathy    EF 32%  . MI, old    INFERIOR LATERAL  . Normal nuclear stress test June 2010   No ischemia. EF 32%  . OA (osteoarthritis) of knee   . Pre-diabetes   . S/P cardiac cath 2006   Occluded SVG to OM. Managed medically  . Vitamin D deficiency     Past Surgical History:  Procedure Laterality Date  . CARDIAC CATHETERIZATION  10/14/2004   THERE IS POSTERIOR LATERAL HYPOKINESIA. EF 30-35%  . CARDIAC CATHETERIZATION  2006   OCCLUDED SVG TO OM  . CARDIAC DEFIBRILLATOR PLACEMENT  10/2008  . CORONARY ARTERY BYPASS GRAFT  1992  . INGUINAL HERNIA REPAIR    . KNEE SURGERY     RIGHT KNEE  . LEAD REVISION/REPAIR  11/04/2017  . LEAD REVISION/REPAIR N/A 11/04/2017   Procedure: LEAD REVISION/REPAIR;  Surgeon: Deboraha Sprang, MD;   Location: [facility];  Service: Cardiovascular;  Laterality: N/A;  . NUCLEAR STRESS TEST  2010   EF 32%. No ischemia  . PPM GENERATOR CHANGEOUT N/A 08/25/2016   Procedure: PPM Generator Changeout;  Surgeon: Deboraha Sprang, MD;  Location: [facility];  Service: Cardiovascular;  Laterality: N/A;    Current Outpatient Medications  Medication Sig Dispense Refill  . acetaminophen (TYLENOL) 500 MG tablet Take 500 mg by mouth 2 (two) times daily as needed for moderate pain.     Marland Kitchen amiodarone (PACERONE) 200 MG tablet Take 1 tablet (200 mg total) by mouth daily. 90 tablet 3  . Ascorbic Acid (VITAMIN C) 1000 MG tablet Take 1,000 mg by mouth daily.    Marland Kitchen aspirin 81 MG tablet Take 81 mg by mouth every Monday, Wednesday, and Friday.     . Calcium Carb-Cholecalciferol (CALCIUM 600 + D PO) Take 1 tablet by mouth daily.    . Cholecalciferol (VITAMIN D3) 5000 UNITS CAPS Take 5,000-10,000 Units by mouth See admin instructions. Take 10,000 IU on M, W, F and 5000 IU all other days    .  diphenhydrAMINE (BENADRYL) 25 MG tablet Take 25 mg by mouth at bedtime as needed for sleep.     Marland Kitchen docusate sodium (COLACE) 100 MG capsule Take 100 mg by mouth daily as needed for mild constipation.    . fexofenadine (ALLEGRA) 180 MG tablet Take 180 mg by mouth daily as needed for allergies.     . halobetasol (ULTRAVATE) 0.05 % cream Apply 1 application topically daily as needed (itching).    . Magnesium 250 MG TABS Take 250 mg by mouth 2 (two) times daily.     . metoprolol succinate (TOPROL-XL) 25 MG 24 hr tablet Take 0.5 tablets (12.5 mg total) by mouth daily with supper. 45 tablet 1  . Omega-3 Fatty Acids (FISH OIL PO) Take 1 capsule by mouth at bedtime.    [name] (SYSTANE OP) Apply 1 drop to eye daily as needed (dry eyes).    . polyethylene glycol (MIRALAX / GLYCOLAX) packet Take 17 g by mouth as needed for moderate constipation.      No current facility-administered medications for this  visit.     Allergies  Allergen Reactions  . Atorvastatin     Elevated cpk   . Meloxicam Other (See Comments)    edema  . Nsaids Other (See Comments)    dyspepsia     Review of Systems negative except from HPI and PMH  Physical Exam BP 114/60   Pulse 72   Ht 5\' 10"  (1.778 m)   Wt 143 lb (64.9 kg)   SpO2 97%   BMI 20.52 kg/m  Well developed and nourished in no acute distress HENT normal Neck supple with JVP-flat Clear Regular rate and rhythm, no murmurs or gallops Abd-soft with active BS No Clubbing cyanosis 1+ edema Skin-warm and dry A & Oriented  Grossly normal sensory and motor function  ECG  Apacing with intrinsic conduction and freq PVCs 36/11/43 freq PVCs   Assessment and  Plan  Ischemic cardiomyopathy    Congestive heart failure-chronic-systolic     Implantable  defibrillator-[employer]  The patient's device was interrogated and the information was fully reviewed.  The device was reprogrammed to maximize longevity   PVCs--- not counted by device by late coupling   Sinus bradycardia  Hyperkalemia  Fatigue   1AVB  Atrial lead fracture--repaired   Without symptoms of ischemia  Some edema -- will follow  PVCs are an issue,  Will continue amiodarone  Need to reassess K as elevated  Fatigue no new meds.. TSH had increased will recheck

## 2018-02-05 LAB — BASIC METABOLIC PANEL
BUN / CREAT RATIO: 21 (ref 10–24)
BUN: 24 mg/dL (ref 8–27)
CO2: 26 mmol/L (ref 20–29)
CREATININE: 1.13 mg/dL (ref 0.76–1.27)
Calcium: 9.1 mg/dL (ref 8.6–10.2)
Chloride: 99 mmol/L (ref 96–106)
GFR calc Af Amer: 67 mL/min/{1.73_m2} (ref >59)
GFR, EST NON AFRICAN AMERICAN: 58 mL/min/{1.73_m2} — AB (ref >59)
Glucose: 115 mg/dL — ABNORMAL HIGH (ref 65–99)
Potassium: 4.9 mmol/L (ref 3.5–5.2)
SODIUM: 137 mmol/L (ref 134–144)

## 2018-02-05 LAB — TSH: TSH: 4.78 u[IU]/mL — ABNORMAL HIGH (ref 0.450–4.500)

## 2018-02-06 LAB — CUP PACEART INCLINIC DEVICE CHECK
Battery Remaining Longevity: 131 mo
Brady Statistic AP VP Percent: 0.64 %
Brady Statistic AP VS Percent: 94.64 %
Brady Statistic AS VP Percent: 0.22 %
Brady Statistic AS VS Percent: 4.51 %
Brady Statistic RV Percent Paced: 0.85 %
Date Time Interrogation Session: 20191205191722
Implantable Lead Implant Date: 20100805
Implantable Lead Location: 753859
Implantable Lead Model: 6947
Lead Channel Impedance Value: 418 Ohm
Lead Channel Pacing Threshold Amplitude: 1 V
Lead Channel Sensing Intrinsic Amplitude: 2 mV
Lead Channel Sensing Intrinsic Amplitude: 2.125 mV
Lead Channel Sensing Intrinsic Amplitude: 9.625 mV
Lead Channel Setting Pacing Amplitude: 2.5 V
MDC IDC LEAD IMPLANT DT: 20190904
MDC IDC LEAD LOCATION: 753860
MDC IDC MSMT BATTERY VOLTAGE: 3.01 V
MDC IDC MSMT LEADCHNL RA IMPEDANCE VALUE: 323 Ohm
MDC IDC MSMT LEADCHNL RA PACING THRESHOLD AMPLITUDE: 0.625 V
MDC IDC MSMT LEADCHNL RA PACING THRESHOLD PULSEWIDTH: 0.4 ms
MDC IDC MSMT LEADCHNL RV IMPEDANCE VALUE: 323 Ohm
MDC IDC MSMT LEADCHNL RV IMPEDANCE VALUE: 361 Ohm
MDC IDC MSMT LEADCHNL RV PACING THRESHOLD PULSEWIDTH: 0.4 ms
MDC IDC MSMT LEADCHNL RV SENSING INTR AMPL: 9.5 mV
MDC IDC PG IMPLANT DT: 20180625
MDC IDC SET LEADCHNL RA PACING AMPLITUDE: 2 V
MDC IDC SET LEADCHNL RV PACING PULSEWIDTH: 0.4 ms
MDC IDC SET LEADCHNL RV SENSING SENSITIVITY: 2.8 mV
MDC IDC STAT BRADY RA PERCENT PACED: 96.75 %

## 2018-02-15 ENCOUNTER — Other Ambulatory Visit: Payer: Self-pay

## 2018-02-15 DIAGNOSIS — I493 Ventricular premature depolarization: Secondary | ICD-10-CM

## 2018-02-15 DIAGNOSIS — E0590 Thyrotoxicosis, unspecified without thyrotoxic crisis or storm: Secondary | ICD-10-CM

## 2018-02-15 DIAGNOSIS — I255 Ischemic cardiomyopathy: Secondary | ICD-10-CM

## 2018-02-15 DIAGNOSIS — I495 Sick sinus syndrome: Secondary | ICD-10-CM

## 2018-02-15 DIAGNOSIS — E038 Other specified hypothyroidism: Secondary | ICD-10-CM

## 2018-02-15 MED ORDER — AMIODARONE HCL 200 MG PO TABS: 200.0000 mg | ORAL_TABLET | Freq: Every day | ORAL | 3 refills | Status: DC

## 2018-02-15 MED ORDER — AMIODARONE HCL 200 MG PO TABS: 200.0000 mg | ORAL_TABLET | Freq: Every day | ORAL | 12 refills | Status: DC

## 2018-02-16 ENCOUNTER — Other Ambulatory Visit: Payer: Self-pay

## 2018-02-16 DIAGNOSIS — I493 Ventricular premature depolarization: Secondary | ICD-10-CM

## 2018-02-16 DIAGNOSIS — I255 Ischemic cardiomyopathy: Secondary | ICD-10-CM

## 2018-02-16 MED ORDER — AMIODARONE HCL 200 MG PO TABS: 200.0000 mg | ORAL_TABLET | Freq: Every day | ORAL | 3 refills | Status: AC

## 2018-02-25 ENCOUNTER — Ambulatory Visit (INDEPENDENT_AMBULATORY_CARE_PROVIDER_SITE_OTHER): Payer: Medicare Other

## 2018-02-25 DIAGNOSIS — I255 Ischemic cardiomyopathy: Secondary | ICD-10-CM | POA: Diagnosis not present

## 2018-02-25 DIAGNOSIS — I5022 Chronic systolic (congestive) heart failure: Secondary | ICD-10-CM

## 2018-02-25 LAB — CUP PACEART REMOTE DEVICE CHECK
Battery Remaining Longevity: 131 mo
Battery Voltage: 3.03 V
Brady Statistic AP VP Percent: 0.72 %
Brady Statistic AP VS Percent: 94.67 %
Brady Statistic AS VP Percent: 0.11 %
Brady Statistic AS VS Percent: 4.49 %
Brady Statistic RA Percent Paced: 96.86 %
Brady Statistic RV Percent Paced: 0.84 %
Implantable Lead Implant Date: 20100805
Implantable Lead Implant Date: 20190904
Implantable Lead Location: 753859
Implantable Lead Location: 753860
Implantable Lead Model: 6947
Implantable Pulse Generator Implant Date: 20180625
Lead Channel Impedance Value: 323 Ohm
Lead Channel Impedance Value: 342 Ohm
Lead Channel Impedance Value: 437 Ohm
Lead Channel Pacing Threshold Amplitude: 0.625 V
Lead Channel Pacing Threshold Amplitude: 1 V
Lead Channel Pacing Threshold Pulse Width: 0.4 ms
Lead Channel Pacing Threshold Pulse Width: 0.4 ms
Lead Channel Sensing Intrinsic Amplitude: 1.625 mV
Lead Channel Sensing Intrinsic Amplitude: 1.625 mV
Lead Channel Sensing Intrinsic Amplitude: 9 mV
Lead Channel Sensing Intrinsic Amplitude: 9 mV
Lead Channel Setting Pacing Amplitude: 2.5 V
Lead Channel Setting Pacing Pulse Width: 0.4 ms
MDC IDC MSMT LEADCHNL RV IMPEDANCE VALUE: 323 Ohm
MDC IDC SESS DTM: 20191226113135
MDC IDC SET LEADCHNL RA PACING AMPLITUDE: 2 V
MDC IDC SET LEADCHNL RV SENSING SENSITIVITY: 2.8 mV

## 2018-02-25 NOTE — Progress Notes (Signed)
Remote pacemaker transmission.   

## 2018-03-02 ENCOUNTER — Encounter: Payer: Self-pay | Admitting: Cardiology

## 2018-03-04 DIAGNOSIS — K1329 Other disturbances of oral epithelium, including tongue: Secondary | ICD-10-CM | POA: Diagnosis not present

## 2018-03-10 DIAGNOSIS — K1329 Other disturbances of oral epithelium, including tongue: Secondary | ICD-10-CM | POA: Diagnosis not present

## 2018-03-22 NOTE — Progress Notes (Signed)
MEDICARE ANNUAL WELLNESS VISIT AND FOLLOW UP Assessment:   Diagnoses and all orders for this visit:  Medicare annual wellness visit, subsequent  ASCAD s/p CABG (1992) Control blood pressure, cholesterol, glucose, increase exercise.   Essential hypertension Continue medications Monitor blood pressure at home; call if consistently over 130/80 Continue DASH diet.   Reminder to go to the ER if any CP, SOB, nausea, dizziness, severe HA, changes vision/speech, left arm numbness and tingling and jaw pain.  Chronic combined systolic and diastolic congestive heart failure (HCC) Stable; continue monitoring and follow up with cardiology  Sinus node dysfunction ([place]) S/p pacemaker; continue follow up with cardiology  Obstructive chronic bronchitis without exacerbation ([place]) Stable without medication; continue to monitor  Gastroesophageal reflux disease, esophagitis presence not specified Well managed on current regimen of OTC medications as needed Discussed diet, avoiding triggers and other lifestyle changes  Neurodermatitis Continue topical steroid; avoid scratching, moisturize daily  OAB (overactive bladder) Patient denies recent incontinence episodes; has had rarely in the past related to poor mobility, wears pull-ups  Automatic implantable cardioverter-defibrillator in situ Continue follow up with cardiology; continue amiodarone for PVCs  Prediabetes Discussed disease and risks of elevated glucose Discussed diet/exercise, weight management  -     CMP/GFR  Vitamin D deficiency Above goal at last visit; adjust to maintain between 70-100 -     VITAMIN D 25 Hydroxy (Vit-D Deficiency, Fractures)  Hyperlipidemia At goal; currently treated by omega 3 supplementContinue low cholesterol diet and exercise.  -     Lipid panel -     TSH  Medication management -     CBC with Differential/Platelet -     CMP/GFR  Hyperkalemia (Hyporeninemic HypoAldosteronism) Continue to monitor  kidney function -     CMP/GFR  Mobility impaired Falls screening and home safety reviewed today, no falls this past year.  Discussed PT, declines this today, will work with home aid to increase exercise Call back if changes mind about home PT   Mild dementia [facility]) Age related atrophy and microvascular changes per 07/2015 CT Wife assists with medications, mild symptoms, continue to monitor, no needs at this time   Over 30 minutes of exam, counseling, chart review, and critical decision making was performed  Future Appointments  Date Time Provider [place]  05/27/2018  7:40 AM [facility] DEVICE REMOTES [facility]  06/01/2018  8:00 AM [facility] LAB [facility]  06/23/2018 11:15 AM Unk Pinto, MD [facility] None  01/13/2019  2:00 PM Unk Pinto, MD [facility] None     Plan:   During the course of the visit the patient was educated and counseled about appropriate screening and preventive services including:    Pneumococcal vaccine   Influenza vaccine  Prevnar 13  Td vaccine  Screening electrocardiogram  Colorectal cancer screening  Diabetes screening  Glaucoma screening  Nutrition counseling    Subjective:  Richard Moody is a 85 y.o. male accompanied by his daughter who presents for Medicare Annual Wellness Visit and 3 month follow up for HTN, hyperlipidemia, hx of abnormal glucose, and vitamin D Def. In May, patient had resection of a verrucous left tongue cancer. He has mild dementia.   HTN predates since 2003 and hx/o ASHD from CABG in 1992. In 2010 he had a PM/AICD implanted by Dr Caryl Comes and recently had a change out  In June 2018 with a PPM, and more recently had lead wire revision due to lead failure. He continues on amiodarone 200 mg daily per Dr. Caryl Comes due  to ongoing PVCs.     He has a history of Combined Systolic and Diastolic (EF 93-23% in 5573), denies dyspnea on exertion, orthopnea, paroxysmal nocturnal dyspnea  and edema. Positive for none. Wt Readings from Last 3 Encounters:  03/23/18 143 lb 6.4 oz (65 kg)  02/04/18 143 lb (64.9 kg)  12/17/17 139 lb (63 kg)    His blood pressure has been controlled at home, today their BP is BP: 110/70 He does not workout. He denies chest pain, shortness of breath, dizziness.   He is on cholesterol medication (omega 3 supplement only due to age/frailty) and denies myalgias. His cholesterol is at goal. The cholesterol last visit was:  Lab Results  Component Value Date   CHOL 194 12/17/2017   HDL 71 12/17/2017   LDLCALC 102 (H) 12/17/2017   TRIG 111 12/17/2017   CHOLHDL 2.7 12/17/2017   He has not been working on diet (poor appetite, encouraged to increase frequency of meals, high protein recommended) and exercise for glucose management, and denies foot ulcerations, nausea, paresthesia of the feet, polydipsia, polyuria, visual disturbances and vomiting. Last A1C in the office was:  Lab Results  Component Value Date   HGBA1C 5.7 (H) 12/17/2017   Last GFR Lab Results  Component Value Date   GFRNONAA 58 (L) 02/04/2018    Patient is on Vitamin D supplement and was above goal at the last visit:    Lab Results  Component Value Date   VD25OH 87 12/17/2017      Medication Review: Current Outpatient Medications on File Prior to Visit  Medication Sig Dispense Refill  . acetaminophen (TYLENOL) 500 MG tablet Take 500 mg by mouth 2 (two) times daily as needed for moderate pain.     Marland Kitchen amiodarone (PACERONE) 200 MG tablet Take 1 tablet (200 mg total) by mouth daily. 90 tablet 3  . Ascorbic Acid (VITAMIN C) 1000 MG tablet Take 1,000 mg by mouth daily.    Marland Kitchen aspirin 81 MG tablet Take 81 mg by mouth every Monday, Wednesday, and Friday.     . Calcium Carb-Cholecalciferol (CALCIUM 600 + D PO) Take 1 tablet by mouth daily.    . Cholecalciferol (VITAMIN D3) 5000 UNITS CAPS Take 5,000-10,000 Units by mouth See admin instructions. Take 10,000 IU on M, W, F and 5000 IU all  other days    . diphenhydrAMINE (BENADRYL) 25 MG tablet Take 25 mg by mouth at bedtime as needed for sleep.     Marland Kitchen docusate sodium (COLACE) 100 MG capsule Take 100 mg by mouth daily as needed for mild constipation.    . fexofenadine (ALLEGRA) 180 MG tablet Take 180 mg by mouth daily as needed for allergies.     . halobetasol (ULTRAVATE) 0.05 % cream Apply 1 application topically daily as needed (itching).    . Magnesium 250 MG TABS Take 250 mg by mouth 2 (two) times daily.     . metoprolol succinate (TOPROL-XL) 25 MG 24 hr tablet Take 0.5 tablets (12.5 mg total) by mouth daily with supper. 45 tablet 1  . Omega-3 Fatty Acids (FISH OIL PO) Take 1 capsule by mouth at bedtime.    [name] (SYSTANE OP) Apply 1 drop to eye daily as needed (dry eyes).    . polyethylene glycol (MIRALAX / GLYCOLAX) packet Take 17 g by mouth as needed for moderate constipation.      No current facility-administered medications on file prior to visit.     Allergies: Allergies  Allergen  Reactions  . Atorvastatin     Elevated cpk   . Meloxicam Other (See Comments)    edema  . Nsaids Other (See Comments)    dyspepsia     Current Problems (verified) has ASCAD s/p CABG (1992); Chronic systolic CHF (EF 85%); Automatic implantable cardioverter-defibrillator in situ; Essential hypertension; Prediabetes; Vitamin D deficiency; Obstructive chronic bronchitis without exacerbation ([place]); Hyperlipidemia; Medication management; GERD ; OAB (overactive bladder); Neurodermatitis; Falls; Chronic combined systolic and diastolic congestive heart failure (HCC); and Sinus node dysfunction (HCC) on their problem list.  Screening Tests Immunization History  Administered Date(s) Administered  . Influenza, High Dose Seasonal PF 12/07/2013, 01/04/2015, 12/17/2017  . Influenza-Unspecified 01/01/2013  . Pneumococcal Conjugate-13 07/30/2016  . Pneumococcal Polysaccharide-23 07/02/2001, 06/01/2013  . Td 10/15/2015   . Tdap 03/04/2003   Preventative care: Last colonoscopy: 2009 DONE Hemoccult- due this year  Influenza: 2019 Pneumonia 23: 2015 TDAP: 2017 Prenvar: 2018 Shingles vaccine: declines  Names of Other Physician/Practitioners you currently use: 1. [facility] here for primary care 2. Dr. Nancy Fetter, eye doctor, last visit 2018 3. Dr. Kalman Shan, dentist, last visit 2019  Patient Care Team: Unk Pinto, MD as PCP - General (Internal Medicine) Unk Pinto, MD (Internal Medicine) Darleen Crocker, MD as Consulting Physician (Ophthalmology) Deboraha Sprang, MD as Consulting Physician (Cardiology)  Surgical: He  has a past surgical history that includes Cardiac catheterization (10/14/2004); Coronary artery bypass graft (1992); Knee surgery; Cardiac defibrillator placement (10/2008); Inguinal hernia repair; Cardiac catheterization (2006); NUCLEAR STRESS TEST (2010); PPM GENERATOR CHANGEOUT (N/A, 08/25/2016); LEAD REVISION/REPAIR (11/04/2017); and LEAD REVISION/REPAIR (N/A, 11/04/2017). Family His family history includes Breast cancer in his sister; Cancer in his mother; Hypertension in his father; Stroke in his father. Social history  He reports that he quit smoking about 36 years ago. He has a 15.00 pack-year smoking history. He has never used smokeless tobacco. He reports that he does not drink alcohol or use drugs.  MEDICARE WELLNESS OBJECTIVES: Physical activity: Current Exercise Habits: The patient does not participate in regular exercise at present, Exercise limited by: orthopedic condition(s);respiratory conditions(s);cardiac condition(s) Cardiac risk factors: Cardiac Risk Factors include: advanced age (>65men, >48 women);dyslipidemia;male gender;hypertension;sedentary lifestyle Depression/mood screen:   Depression screen [facility] 2/9 03/23/2018  Decreased Interest 0  Down, Depressed, Hopeless 0  PHQ - 2 Score 0    ADLs:  In your present state of health,  do you have any difficulty performing the following activities: 03/23/2018 12/16/2017  Hearing? N Y  Comment has bilateral hearing aids bilat hearing aids  Vision? N N  Difficulty concentrating or making decisions? Y Y  Comment - according to wife has poor short term recall  Walking or climbing stairs? Y Y  Comment can do steps with handrails, ambulates with walker  -  Dressing or bathing? N N  Comment wife assists -  Doing errands, shopping? Tempie Donning  Comment driven by family  wife transports  Conservation officer, nature and eating ? N -  Using the Toilet? Y -  In the past six months, have you accidently leaked urine? Y -  Comment wears pullups -  Do you have problems with loss of bowel control? N -  Managing your Medications? N -  Managing your Finances? N -  Housekeeping or managing your Housekeeping? Y -  Comment has help -  Some recent data might be hidden     Cognitive Testing  Alert? Yes  Normal Appearance?Yes  Oriented to person? Yes  Place? Yes  Time? Yes  Recall of three objects?  1/3  Can perform simple calculations? Yes  Displays appropriate judgment?Yes  Can read the correct time from a watch face?Yes  EOL planning: Does Patient Have a Medical Advance Directive?: Yes Type of Advance Directive: Healthcare Power of Attorney, Living will Does patient want to make changes to medical advance directive?: No - Patient declined Copy of [place] in Chart?: No - copy requested   Objective:   Today's Vitals   03/23/18 1120  BP: 110/70  Pulse: 72  Temp: (!) 97.5 F (36.4 C)  SpO2: 99%  Weight: 143 lb 6.4 oz (65 kg)  Height: 5\' 10"  (1.778 m)   Body mass index is 20.58 kg/m.  General appearance: alert, frail, no distress, WD/WN, male HEENT: normocephalic, sclerae anicteric, TMs pearly, nares patent, no discharge or erythema, pharynx normal Oral cavity: MMM, no lesions Neck: supple, no lymphadenopathy, no thyromegaly, no masses Heart: Heart sounds distant,  rhythm irregular, normal, no distinct murmurs, distal pulses 2+ Lungs: CTA bilaterally, no wheezes, rhonchi, or rales Abdomen: +bs, soft, non tender, non distended, no masses, no hepatomegaly, no splenomegaly Musculoskeletal: nontender, no swelling, no obvious deformity, ambulates slowly with walker Extremities: no edema, no cyanosis, no clubbing, bilateral compression hose in place Pulses: 2+ symmetric, upper and lower extremities, normal cap refill Neurological: alert, oriented x 3, CN2-12 intact, strength reduced but symmetrical upper extremities and lower extremities, sensation normal throughout,  gait slow with walker Skin: superficial breakdown < 76mm to top of gluteal cleft without discharge, erythema. Bilateral feet with thickened, yellowed toes, multiple callouses without erythema or notable breakdown  Psychiatric: normal affect, behavior normal, pleasant   Medicare Attestation I have personally reviewed: The patient's medical and social history Their use of alcohol, tobacco or illicit drugs Their current medications and supplements The patient's functional ability including ADLs,fall risks, home safety risks, cognitive, and hearing and visual impairment Diet and physical activities Evidence for depression or mood disorders  The patient's weight, height, BMI, and visual acuity have been recorded in the chart.  I have made referrals, counseling, and provided education to the patient based on review of the above and I have provided the patient with a written personalized care plan for preventive services.     Izora Ribas, NP   03/23/2018

## 2018-03-23 ENCOUNTER — Ambulatory Visit (INDEPENDENT_AMBULATORY_CARE_PROVIDER_SITE_OTHER): Payer: Medicare Other | Admitting: Adult Health

## 2018-03-23 ENCOUNTER — Encounter: Payer: Self-pay | Admitting: Adult Health

## 2018-03-23 VITALS — BP 110/70 | HR 72 | Temp 97.5°F | Ht 70.0 in | Wt 143.4 lb

## 2018-03-23 DIAGNOSIS — Z0000 Encounter for general adult medical examination without abnormal findings: Secondary | ICD-10-CM

## 2018-03-23 DIAGNOSIS — R6889 Other general symptoms and signs: Secondary | ICD-10-CM | POA: Diagnosis not present

## 2018-03-23 DIAGNOSIS — R7303 Prediabetes: Secondary | ICD-10-CM | POA: Diagnosis not present

## 2018-03-23 DIAGNOSIS — Z79899 Other long term (current) drug therapy: Secondary | ICD-10-CM

## 2018-03-23 DIAGNOSIS — L84 Corns and callosities: Secondary | ICD-10-CM

## 2018-03-23 DIAGNOSIS — E559 Vitamin D deficiency, unspecified: Secondary | ICD-10-CM | POA: Diagnosis not present

## 2018-03-23 DIAGNOSIS — I10 Essential (primary) hypertension: Secondary | ICD-10-CM | POA: Diagnosis not present

## 2018-03-23 DIAGNOSIS — L280 Lichen simplex chronicus: Secondary | ICD-10-CM

## 2018-03-23 DIAGNOSIS — Z0001 Encounter for general adult medical examination with abnormal findings: Secondary | ICD-10-CM | POA: Diagnosis not present

## 2018-03-23 DIAGNOSIS — J449 Chronic obstructive pulmonary disease, unspecified: Secondary | ICD-10-CM

## 2018-03-23 DIAGNOSIS — E782 Mixed hyperlipidemia: Secondary | ICD-10-CM | POA: Diagnosis not present

## 2018-03-23 DIAGNOSIS — I495 Sick sinus syndrome: Secondary | ICD-10-CM

## 2018-03-23 DIAGNOSIS — I5022 Chronic systolic (congestive) heart failure: Secondary | ICD-10-CM

## 2018-03-23 DIAGNOSIS — I255 Ischemic cardiomyopathy: Secondary | ICD-10-CM | POA: Diagnosis not present

## 2018-03-23 DIAGNOSIS — K219 Gastro-esophageal reflux disease without esophagitis: Secondary | ICD-10-CM

## 2018-03-23 DIAGNOSIS — I5042 Chronic combined systolic (congestive) and diastolic (congestive) heart failure: Secondary | ICD-10-CM | POA: Diagnosis not present

## 2018-03-23 DIAGNOSIS — Z95810 Presence of automatic (implantable) cardiac defibrillator: Secondary | ICD-10-CM

## 2018-03-23 DIAGNOSIS — F03A0 Unspecified dementia, mild, without behavioral disturbance, psychotic disturbance, mood disturbance, and anxiety: Secondary | ICD-10-CM

## 2018-03-23 DIAGNOSIS — F0390 Unspecified dementia without behavioral disturbance: Secondary | ICD-10-CM

## 2018-03-23 DIAGNOSIS — N3281 Overactive bladder: Secondary | ICD-10-CM | POA: Diagnosis not present

## 2018-03-23 DIAGNOSIS — Z6820 Body mass index (BMI) 20.0-20.9, adult: Secondary | ICD-10-CM

## 2018-03-23 DIAGNOSIS — Z7409 Other reduced mobility: Secondary | ICD-10-CM

## 2018-03-23 NOTE — Patient Instructions (Addendum)
Richard Moody , Thank you for taking time to come for your Medicare Wellness Visit. I appreciate your ongoing commitment to your health goals. Please review the following plan we discussed and let me know if I can assist you in the future.   These are the goals we discussed: Goals    . Exercise 15 min daily        This is a list of the screening recommended for you and due dates:  Health Maintenance  Topic Date Due  . Tetanus Vaccine  10/14/2025  . Flu Shot  Completed  . Pneumonia vaccines  Completed     Preventing Pressure Injuries What is a pressure injury?  A pressure injury, previously called a bedsore or a pressure ulcer, is an injury to the skin and underlying tissue caused by pressure. A pressure injury can happen when your skin presses against a surface, such as a mattress or wheelchair seat, for too long. The pressure on the blood vessels causes reduced blood flow to your skin. This can eventually cause the skin tissue to die and break down into a wound. Pressure injuries usually develop:  Over bony parts of the body, such as the tailbone, shoulders, elbows, hips, and heels.  Under medical devices, such as respiratory equipment, stockings, tubes, and splints. They can cause pain, muscle damage, and infection. How do pressure injuries happen? Pressure injuries are caused by a lack of blood supply to an area of skin. These injuries begin as a reddened area on the skin and can become an open sore. They can result from intense pressure over a short period of time or from less pressure over a long period of time. Pressure injuries can vary in severity. This condition is more likely to develop in people who:  Are in the hospital or an extended care facility.  Are bedridden or in a wheelchair.  Have an injury or disease that keeps them from: ? Moving normally. ? Feeling pain or pressure. ? Communicating if they feel pain or pressure.  Have a condition that: ? Makes them  sleepy or less alert. ? Causes poor blood flow.  Need to wear a medical device.  Have poor control of their bladder or bowel functions (incontinence).  Have poor nutrition (malnutrition).  Have had this condition before.  Are of certain ethnicities. People of African American and Latino or Hispanic descent are at higher risk compared to other ethnic groups. What actions can I take to prevent pressure injuries? Skin Care  Keep your skin clean and dry. Gently pat your skin dry.  Do not rub or massage boney areas of your skin.  Moisturize dry skin.  Use gentle cleansers and skin protectants routinely if you are incontinent.  Check your skin every day for any changes in color and for any new blisters or sores. Make sure to check under and around any medical devices and between skin folds. Have a caregiver do this for you if you are not able. Reducing and Redistributing Pressure  Do not lie or sit in one position for a long time. Move or change position every two hours, or as told by your health care provider.  Use pillows or cushions to redistribute pressure. Ask your health care provider to recommend cushions or pads for you.  Use medical devices that to not rub your skin. Tell your health care provider if one of your medical devices is causing pain or irritation. Medicines  Take over-the-counter and prescription medicines only as told  by your health care provider.  If you were prescribed an antibiotic medicine, take it or apply it as told by your health care provider. Do not stop taking or using the antibiotic even if your condition improves. General Instructions   Be as active as you can every day. Ask your health care provider to suggest safe exercises or activities.  Work with your health care provider to manage any chronic health conditions.  Eat a healthy diet that includes lots of protein. Ask your health care provider for diet advice.  Drink enough fluid to keep your  urine clear or pale yellow.  Do not abuse drugs or alcohol.  Do not smoke.  Keep all follow-up visits as told by your health care provider. This is important. What steps will be taken to prevent pressure injuries if I am in the hospital? Your health care providers:  Will inspect your skin at least daily. Skin under or around medical devices should be checked at least twice a day while you are in the hospital.  May recommend that you use certain types of bedding to help prevent them. These may include a pad, mattress, or chair cushion that is filled with gel, air, water, or foam.  Will evaluate your nutrition and consult a diet specialist (dietician), if needed.  Will inspect and change any wound dressings regularly.  May help you move into different positions every few hours.  Will adjust any medical devices and braces as needed to limit pressure on your skin.  Will keep your skin clean and dry.  May use gentle cleansers and skin protectants, if you are incontinent.  Will moisturize any dry skin. Make sure that you let your health care provider know if you feel or see any changes in your skin. This information is not intended to replace advice given to you by your health care provider. Make sure you discuss any questions you have with your health care provider. Document Released: 03/27/2004 Document Revised: 09/04/2016 Document Reviewed: 11/23/2014 Elsevier Interactive Patient Education  2019 [employer].

## 2018-03-24 ENCOUNTER — Other Ambulatory Visit: Payer: Self-pay | Admitting: Adult Health

## 2018-03-24 DIAGNOSIS — N183 Chronic kidney disease, stage 3 unspecified: Secondary | ICD-10-CM

## 2018-03-24 LAB — COMPLETE METABOLIC PANEL WITH GFR
AG Ratio: 1.4 (calc) (ref 1.0–2.5)
ALT: 14 U/L (ref 9–46)
AST: 20 U/L (ref 10–35)
Albumin: 3.9 g/dL (ref 3.6–5.1)
Alkaline phosphatase (APISO): 91 U/L (ref 40–115)
BUN/Creatinine Ratio: 23 (calc) — ABNORMAL HIGH (ref 6–22)
BUN: 33 mg/dL — ABNORMAL HIGH (ref 7–25)
CO2: 27 mmol/L (ref 20–32)
CREATININE: 1.42 mg/dL — AB (ref 0.70–1.11)
Calcium: 9.6 mg/dL (ref 8.6–10.3)
Chloride: 102 mmol/L (ref 98–110)
GFR, EST AFRICAN AMERICAN: 51 mL/min/{1.73_m2} — AB (ref >=60)
GFR, Est Non African American: 44 mL/min/{1.73_m2} — ABNORMAL LOW (ref >=60)
Globulin: 2.7 g/dL (calc) (ref 1.9–3.7)
Glucose, Bld: 94 mg/dL (ref 65–99)
Potassium: 5.1 mmol/L (ref 3.5–5.3)
Sodium: 139 mmol/L (ref 135–146)
Total Bilirubin: 0.5 mg/dL (ref 0.2–1.2)
Total Protein: 6.6 g/dL (ref 6.1–8.1)

## 2018-03-24 LAB — TSH: TSH: 4.27 m[IU]/L (ref 0.40–4.50)

## 2018-03-24 LAB — LIPID PANEL
CHOL/HDL RATIO: 2.7 (calc) (ref <5.0)
Cholesterol: 171 mg/dL (ref <200)
HDL: 64 mg/dL (ref >40)
LDL Cholesterol (Calc): 90 mg/dL (calc)
NON-HDL CHOLESTEROL (CALC): 107 mg/dL (ref <130)
Triglycerides: 79 mg/dL (ref <150)

## 2018-03-24 LAB — CBC WITH DIFFERENTIAL/PLATELET
ABSOLUTE MONOCYTES: 636 {cells}/uL (ref 200–950)
Basophils Absolute: 72 cells/uL (ref 0–200)
Basophils Relative: 1.2 %
Eosinophils Absolute: 180 cells/uL (ref 15–500)
Eosinophils Relative: 3 %
HCT: 30.8 % — ABNORMAL LOW (ref 38.5–50.0)
Hemoglobin: 10.7 g/dL — ABNORMAL LOW (ref 13.2–17.1)
Lymphs Abs: 1014 cells/uL (ref 850–3900)
MCH: 32.4 pg (ref 27.0–33.0)
MCHC: 34.7 g/dL (ref 32.0–36.0)
MCV: 93.3 fL (ref 80.0–100.0)
MPV: 10.8 fL (ref 7.5–12.5)
Monocytes Relative: 10.6 %
NEUTROS ABS: 4098 {cells}/uL (ref 1500–7800)
Neutrophils Relative %: 68.3 %
Platelets: 218 10*3/uL (ref 140–400)
RBC: 3.3 10*6/uL — ABNORMAL LOW (ref 4.20–5.80)
RDW: 12.7 % (ref 11.0–15.0)
Total Lymphocyte: 16.9 %
WBC: 6 10*3/uL (ref 3.8–10.8)

## 2018-03-24 LAB — HEMOGLOBIN A1C
Hgb A1c MFr Bld: 5.6 % of total Hgb (ref <5.7)
Mean Plasma Glucose: 114 (calc)
eAG (mmol/L): 6.3 (calc)

## 2018-03-30 ENCOUNTER — Encounter: Payer: Self-pay | Admitting: Podiatry

## 2018-03-30 ENCOUNTER — Ambulatory Visit (INDEPENDENT_AMBULATORY_CARE_PROVIDER_SITE_OTHER): Payer: Medicare Other | Admitting: Podiatry

## 2018-03-30 VITALS — BP 117/66 | HR 74

## 2018-03-30 DIAGNOSIS — B351 Tinea unguium: Secondary | ICD-10-CM

## 2018-03-30 DIAGNOSIS — I255 Ischemic cardiomyopathy: Secondary | ICD-10-CM

## 2018-03-30 DIAGNOSIS — M2040 Other hammer toe(s) (acquired), unspecified foot: Secondary | ICD-10-CM | POA: Diagnosis not present

## 2018-03-30 DIAGNOSIS — I739 Peripheral vascular disease, unspecified: Secondary | ICD-10-CM

## 2018-03-30 NOTE — Progress Notes (Signed)
This patient presents to the office for treatment of his long thick nails.  He presents to the office with his wife for evaluation and treatment.  He has not been seen in over 1 year.  Marland Kitchen  He is unable to self treat.  He presents to the office for preventative foot care services.    General Appearance  Alert, conversant and in no acute stress.  Vascular  Dorsalis pedis and posterior tibial  pulses are not  Palpable due to severe swelling.  bilaterally.  Capillary return is within normal limits  bilaterally. Temperature is within normal limits  bilaterally.  Neurologic  Senn-Weinstein monofilament wire test diminished   bilaterally. Muscle power within normal limits bilaterally.  Nails Thick disfigured discolored nails with subungual debris  from hallux to fifth toes bilaterally. No evidence of bacterial infection or drainage bilaterally.  Orthopedic  No limitations of motion  feet .  No crepitus or effusions noted.  Hammer toe 4th left.  Hallux malleus right hallux.  Skin  normotropic skin with no porokeratosis noted bilaterally.  No signs of infections or ulcers noted.  Onychomycosis  B/L  IE.  Debride nails  X 10.  RTC 3 months.   Gardiner Barefoot DPM

## 2018-04-12 ENCOUNTER — Ambulatory Visit (INDEPENDENT_AMBULATORY_CARE_PROVIDER_SITE_OTHER): Payer: Medicare Other

## 2018-04-12 DIAGNOSIS — N183 Chronic kidney disease, stage 3 unspecified: Secondary | ICD-10-CM

## 2018-04-12 NOTE — Progress Notes (Signed)
Patient presents to the office for a nurse visit to have Kidney Function checked with having labs drawn. No questions or concerns reported from patient.  Vitals taken and recorded.

## 2018-04-13 LAB — BASIC METABOLIC PANEL WITH GFR
BUN/Creatinine Ratio: 24 (calc) — ABNORMAL HIGH (ref 6–22)
BUN: 30 mg/dL — AB (ref 7–25)
CO2: 28 mmol/L (ref 20–32)
Calcium: 9.2 mg/dL (ref 8.6–10.3)
Chloride: 103 mmol/L (ref 98–110)
Creat: 1.24 mg/dL — ABNORMAL HIGH (ref 0.70–1.11)
GFR, Est African American: 60 mL/min/{1.73_m2} (ref >=60)
GFR, Est Non African American: 52 mL/min/{1.73_m2} — ABNORMAL LOW (ref >=60)
Glucose, Bld: 83 mg/dL (ref 65–99)
Potassium: 5.2 mmol/L (ref 3.5–5.3)
Sodium: 138 mmol/L (ref 135–146)

## 2018-04-22 ENCOUNTER — Other Ambulatory Visit: Payer: Self-pay

## 2018-04-22 DIAGNOSIS — Z1211 Encounter for screening for malignant neoplasm of colon: Secondary | ICD-10-CM | POA: Diagnosis not present

## 2018-04-22 DIAGNOSIS — Z1212 Encounter for screening for malignant neoplasm of rectum: Principal | ICD-10-CM

## 2018-04-22 LAB — POC HEMOCCULT BLD/STL (HOME/3-CARD/SCREEN)
Card #2 Fecal Occult Blod, POC: NEGATIVE
Card #3 Fecal Occult Blood, POC: NEGATIVE
FECAL OCCULT BLD: NEGATIVE

## 2018-05-27 ENCOUNTER — Other Ambulatory Visit: Payer: Self-pay

## 2018-05-27 ENCOUNTER — Ambulatory Visit (INDEPENDENT_AMBULATORY_CARE_PROVIDER_SITE_OTHER): Payer: Medicare Other | Admitting: *Deleted

## 2018-05-27 DIAGNOSIS — I255 Ischemic cardiomyopathy: Secondary | ICD-10-CM | POA: Diagnosis not present

## 2018-05-27 LAB — CUP PACEART REMOTE DEVICE CHECK
Battery Remaining Longevity: 128 mo
Battery Voltage: 3.02 V
Brady Statistic AP VP Percent: 0.39 %
Brady Statistic AS VP Percent: 0.04 %
Brady Statistic AS VS Percent: 1.49 %
Brady Statistic RA Percent Paced: 99.06 %
Brady Statistic RV Percent Paced: 0.43 %
Date Time Interrogation Session: 20200326053410
Implantable Lead Implant Date: 20190904
Implantable Lead Location: 753860
Implantable Lead Model: 5076
Implantable Lead Model: 6947
Implantable Pulse Generator Implant Date: 20180625
Lead Channel Impedance Value: 323 Ohm
Lead Channel Impedance Value: 323 Ohm
Lead Channel Impedance Value: 342 Ohm
Lead Channel Impedance Value: 418 Ohm
Lead Channel Pacing Threshold Amplitude: 0.625 V
Lead Channel Pacing Threshold Amplitude: 1 V
Lead Channel Pacing Threshold Pulse Width: 0.4 ms
Lead Channel Sensing Intrinsic Amplitude: 2 mV
Lead Channel Sensing Intrinsic Amplitude: 2 mV
Lead Channel Sensing Intrinsic Amplitude: 9.875 mV
Lead Channel Sensing Intrinsic Amplitude: 9.875 mV
Lead Channel Setting Pacing Amplitude: 2 V
Lead Channel Setting Pacing Amplitude: 2.5 V
Lead Channel Setting Pacing Pulse Width: 0.4 ms
Lead Channel Setting Sensing Sensitivity: 2.8 mV
MDC IDC LEAD IMPLANT DT: 20100805
MDC IDC LEAD LOCATION: 753859
MDC IDC MSMT LEADCHNL RV PACING THRESHOLD PULSEWIDTH: 0.4 ms
MDC IDC STAT BRADY AP VS PERCENT: 98.08 %

## 2018-06-01 ENCOUNTER — Other Ambulatory Visit: Payer: Medicare Other

## 2018-06-03 ENCOUNTER — Encounter: Payer: Self-pay | Admitting: Cardiology

## 2018-06-03 NOTE — Progress Notes (Signed)
Remote pacemaker transmission.   

## 2018-06-14 DIAGNOSIS — K1370 Unspecified lesions of oral mucosa: Secondary | ICD-10-CM | POA: Diagnosis not present

## 2018-06-22 ENCOUNTER — Telehealth: Payer: Self-pay | Admitting: *Deleted

## 2018-06-22 ENCOUNTER — Encounter: Payer: Self-pay | Admitting: Internal Medicine

## 2018-06-22 NOTE — Telephone Encounter (Signed)
A message was left to inform the spouse that she needs to call the oral surgeon again and ask to speak with the doctor on call.  The patient does have and appointment with Dr Melford Aase on 06/23/2018.

## 2018-06-22 NOTE — Progress Notes (Signed)
THIS ENCOUNTER IS A VIRTUAL VISIT DUE TO COVID-19 - PATIENT WAS NOT SEEN IN THE OFFICE.  PATIENT HAS CONSENTED TO VIRTUAL VISIT / TELEMEDICINE VISIT  This provider placed a call to Richard Moody using telephone, his appointment was changed to a virtual office visit to reduce the risk of exposure to the COVID-19 virus and to help [name] remain healthy and safe. The virtual visit will also provide continuity of care. He verbalizes understanding.   Virtual Visit via telephone Note  I connected with patient & his wife Richard Moody on 06/23/18  by telephone.  I verified that I am speaking with the correct person using two identifiers.        I discussed the limitations of evaluation and management by telemedicine and the availability of in person appointments. The patient expressed understanding and agreed to proceed.  History of Present Illness:      This very nice 85 y.o. MWM presents for 6 month follow up with HTN,  ASHD/PPM,  HLD, Pre-Diabetes and Vitamin D Deficiency. In May 2019, patient had a verrucous Tongue cancer excised by Dr Richard Moody. Apparently over the last 1-2 weeks, he has developed a rapidly increasing in size flesh pink growth of his Lefty lower lateral buccal ridge. Pictures sent by text from wife appear highly suspect for a rapidly growing oral cancer. (Bx's are pending from 5-6 days ago by Dr Richard Moody).      Patient is treated for HTN (2003) & BP has been controlled at home. Today's BP: 121/61. In 2003, he underwent CABG and then in 2010 had a PPM/AICD implanted (Dr Richard Moody). Patient has been dx'd with a congestive cardiomyopathy (EF 35%).  Patient has had no complaints of any cardiac type chest pain, palpitations, dyspnea / orthopnea / PND, dizziness, claudication, or dependent edema.      Hyperlipidemia is controlled with diet & meds. Patient denies myalgias or other med SE's. Last Lipids were at goal: Lab Results  Component Value Date   CHOL 171 03/23/2018   HDL 64  03/23/2018   LDLCALC 90 03/23/2018   TRIG 79 03/23/2018   CHOLHDL 2.7 03/23/2018       Also, the patient has history of PreDiabetes (A1c 5.8% / 2011) and has had no symptoms of reactive hypoglycemia, diabetic polys, paresthesias or visual blurring.  Last A1c was Normal & at goal: Lab Results  Component Value Date   HGBA1C 5.6 03/23/2018      Further, the patient also has history of Vitamin D Deficiency ("22" / 2008)  and supplements vitamin D without any suspected side-effects. Last vitamin D was at goal: Lab Results  Component Value Date   VD25OH 87 12/17/2017   Current Outpatient Medications on File Prior to Visit  Medication Sig  . acetaminophen (TYLENOL) 500 MG tablet Take 500 mg by mouth 2 (two) times daily as needed for moderate pain.   Marland Kitchen amiodarone (PACERONE) 200 MG tablet Take 1 tablet (200 mg total) by mouth daily.  . Ascorbic Acid (VITAMIN C) 1000 MG tablet Take 1,000 mg by mouth daily.  Marland Kitchen aspirin 81 MG tablet Take 81 mg by mouth every Monday, Wednesday, and Friday.   . Calcium Carb-Cholecalciferol (CALCIUM 600 + D PO) Take 1 tablet by mouth daily.  . chlorhexidine (PERIDEX) 0.12 % solution SWISH AND SPIT WITH 1/2 OUNCE TWICE DAILY  . Cholecalciferol (VITAMIN D3) 5000 UNITS CAPS Take 5,000-10,000 Units by mouth See admin instructions. Take 10,000 IU on M, W, F and  5000 IU all other days  . diphenhydrAMINE (BENADRYL) 25 MG tablet Take 25 mg by mouth at bedtime as needed for sleep.   Marland Kitchen docusate sodium (COLACE) 100 MG capsule Take 100 mg by mouth daily as needed for mild constipation.  . fexofenadine (ALLEGRA) 180 MG tablet Take 180 mg by mouth daily as needed for allergies.   . halobetasol (ULTRAVATE) 0.05 % cream Apply 1 application topically daily as needed (itching).  . Magnesium 250 MG TABS Take 250 mg by mouth 2 (two) times daily.   . metoprolol succinate (TOPROL-XL) 25 MG 24 hr tablet Take 0.5 tablets (12.5 mg total) by mouth daily with supper.  . Omega-3 Fatty Acids (FISH  OIL PO) Take 1 capsule by mouth at bedtime.  Richard Moody Glycol-Propyl Glycol (SYSTANE OP) Apply 1 drop to eye daily as needed (dry eyes).  . polyethylene glycol (MIRALAX / GLYCOLAX) packet Take 17 g by mouth as needed for moderate constipation.    No current facility-administered medications on file prior to visit.    Allergies  Allergen Reactions  . Atorvastatin     Elevated cpk   . Meloxicam Other (See Comments)    edema  . Nsaids Other (See Comments)    dyspepsia    PMHx:   Past Medical History:  Diagnosis Date  . Essential hypertension   . Ganglion cyst of wrist    LEFT WRIST  . GERD (gastroesophageal reflux disease)   . Hx of CABG 1992  . Hyperlipidemia   . ICD (implantable cardiac defibrillator) in place August 2010  . Ischemic cardiomyopathy    EF 32%  . MI, old    INFERIOR LATERAL  . Normal nuclear stress test June 2010   No ischemia. EF 32%  . OA (osteoarthritis) of knee   . Pre-diabetes   . S/P cardiac cath 2006   Occluded SVG to OM. Managed medically  . Vitamin D deficiency    Immunization History  Administered Date(s) Administered  . Influenza, High Dose Seasonal PF 12/07/2013, 01/04/2015, 12/17/2017  . Influenza-Unspecified 01/01/2013  . Pneumococcal Conjugate-13 07/30/2016  . Pneumococcal Polysaccharide-23 07/02/2001, 06/01/2013  . Td 10/15/2015  . Tdap 03/04/2003   Past Surgical History:  Procedure Laterality Date  . CARDIAC CATHETERIZATION  10/14/2004   THERE IS POSTERIOR LATERAL HYPOKINESIA. EF 30-35%  . CARDIAC CATHETERIZATION  2006   OCCLUDED SVG TO OM  . CARDIAC DEFIBRILLATOR PLACEMENT  10/2008  . CATARACT EXTRACTION, BILATERAL Bilateral 08/30/2012   Dr. Talbert Moody  . CORONARY ARTERY BYPASS GRAFT  1992  . INGUINAL HERNIA REPAIR    . KNEE SURGERY     RIGHT KNEE TKA  . LEAD REVISION/REPAIR  11/04/2017  . LEAD REVISION/REPAIR N/A 11/04/2017   Procedure: LEAD REVISION/REPAIR;  Surgeon: Deboraha Sprang, MD;  Location: [facility];  Service:  Cardiovascular;  Laterality: N/A;  . NUCLEAR STRESS TEST  2010   EF 32%. No ischemia  . PPM GENERATOR CHANGEOUT N/A 08/25/2016   Procedure: PPM Generator Changeout;  Surgeon: Deboraha Sprang, MD;  Location: [facility];  Service: Cardiovascular;  Laterality: N/A;   FHx:    Reviewed / unchanged  SHx:    Reviewed / unchanged   Systems Review:  Constitutional: Denies fever, chills, wt changes, headaches, insomnia, fatigue, night sweats, change in appetite. Eyes: Denies redness, blurred vision, diplopia, discharge, itchy, watery eyes.  ENT: Denies discharge, congestion, post nasal drip, epistaxis, sore throat, earache, hearing loss, dental pain, tinnitus, vertigo, sinus pain, snoring.  CV: Denies chest pain, palpitations, irregular heartbeat, syncope, dyspnea, diaphoresis, orthopnea, PND, claudication or edema. Respiratory: denies cough, dyspnea, DOE, pleurisy, hoarseness, laryngitis, wheezing.  Gastrointestinal: Denies dysphagia, odynophagia, heartburn, reflux, water brash, abdominal pain or cramps, nausea, vomiting, bloating, diarrhea, constipation, hematemesis, melena, hematochezia  or hemorrhoids. Genitourinary: Denies dysuria, frequency, urgency, nocturia, hesitancy, discharge, hematuria or flank pain. Musculoskeletal: Denies arthralgias, myalgias, stiffness, jt. swelling, pain, limping or strain/sprain.  Skin: Denies pruritus, rash, hives, warts, acne, eczema or change in skin lesion(s). Neuro: No weakness, tremor, incoordination, spasms, paresthesia or pain. Psychiatric: Has short term memory deficits Endo: Denies change in weight, skin or hair change.  Heme/Lymph: No excessive bleeding, bruising or enlarged lymph nodes.  Physical Exam  BP 121/61   Pulse 73   Temp (!) 97 F (36.1 C)   Resp 16   Ht 5\' 10"  (1.778 m)   Wt 135 lb 9.6 oz (61.5 kg)   BMI 19.46 kg/m    General : Well sounding patient in no apparent distress HEENT: no hoarseness, no cough for duration of visit  Lungs: speaks in complete sentences, no audible wheezing, no apparent distress Neurological: alert, oriented x 3 Psychiatric: pleasant, judgement appropriate   Assessment and Plan:  1. Essential hypertension  - Continue medication, monitor blood pressure at home.  - Continue DASH diet.  Reminder to go to the ER if any CP,  SOB, nausea, dizziness, severe HA, changes vision/speech.  2. Hyperlipidemia, mixed  - Continue diet/meds, exercise,& lifestyle modifications.  - Continue monitor periodic cholesterol/liver & renal functions   3. Abnormal glucose  - Continue diet, exercise  - Lifestyle modifications.  - Monitor appropriate labs.  4. Vitamin D deficiency  - Continue supplementation.   5. Autonomic postural hypotension  6. ASCAD s/p CABG (1992)  7. Prediabetes  8. Chronic combined systolic and diastolic congestive heart failure ([place])  9. Automatic implantable cardioverter-defibrillator in situ  10. Suspect Recurrent Oral Cancer  - Patient's wife relates Dr Richard Moody indicated intention to refer to Dr Radene Journey as next step therapy. Will expedite referral at wife's request.    11. Medication management        Discussed  regular exercise, BP monitoring, weight control to achieve/maintain BMI less than 25 and discussed med and SE's. Recommended labs to to be scheduled next week. I discussed the assessment and treatment plan with the patient. The patient was provided an opportunity to ask questions and all were answered. The patient agreed with the plan and demonstrated an understanding of the instructions.       I provided 27 minutes of non-face-to-face time during this encounter and over 35 minutes of  counseling, chart review and  complex critical decision making was performed   Kirtland Bouchard, MD

## 2018-06-22 NOTE — Patient Instructions (Signed)

## 2018-06-23 ENCOUNTER — Other Ambulatory Visit: Payer: Self-pay

## 2018-06-23 ENCOUNTER — Encounter: Payer: Self-pay | Admitting: Internal Medicine

## 2018-06-23 ENCOUNTER — Ambulatory Visit: Payer: Medicare Other | Admitting: Internal Medicine

## 2018-06-23 VITALS — BP 121/61 | HR 73 | Temp 97.0°F | Resp 16 | Ht 70.0 in | Wt 135.6 lb

## 2018-06-23 DIAGNOSIS — R7309 Other abnormal glucose: Secondary | ICD-10-CM | POA: Diagnosis not present

## 2018-06-23 DIAGNOSIS — I951 Orthostatic hypotension: Secondary | ICD-10-CM | POA: Diagnosis not present

## 2018-06-23 DIAGNOSIS — Z79899 Other long term (current) drug therapy: Secondary | ICD-10-CM

## 2018-06-23 DIAGNOSIS — I255 Ischemic cardiomyopathy: Secondary | ICD-10-CM

## 2018-06-23 DIAGNOSIS — I5042 Chronic combined systolic (congestive) and diastolic (congestive) heart failure: Secondary | ICD-10-CM | POA: Diagnosis not present

## 2018-06-23 DIAGNOSIS — R7303 Prediabetes: Secondary | ICD-10-CM

## 2018-06-23 DIAGNOSIS — E559 Vitamin D deficiency, unspecified: Secondary | ICD-10-CM | POA: Diagnosis not present

## 2018-06-23 DIAGNOSIS — Z95810 Presence of automatic (implantable) cardiac defibrillator: Secondary | ICD-10-CM

## 2018-06-23 DIAGNOSIS — E782 Mixed hyperlipidemia: Secondary | ICD-10-CM

## 2018-06-23 DIAGNOSIS — C069 Malignant neoplasm of mouth, unspecified: Secondary | ICD-10-CM | POA: Diagnosis not present

## 2018-06-23 DIAGNOSIS — I10 Essential (primary) hypertension: Secondary | ICD-10-CM | POA: Diagnosis not present

## 2018-06-24 ENCOUNTER — Other Ambulatory Visit: Payer: Self-pay

## 2018-06-24 ENCOUNTER — Other Ambulatory Visit: Payer: Medicare Other

## 2018-06-24 ENCOUNTER — Other Ambulatory Visit: Payer: Self-pay | Admitting: Internal Medicine

## 2018-06-24 ENCOUNTER — Encounter: Payer: Self-pay | Admitting: Internal Medicine

## 2018-06-24 DIAGNOSIS — I10 Essential (primary) hypertension: Secondary | ICD-10-CM | POA: Diagnosis not present

## 2018-06-24 DIAGNOSIS — E039 Hypothyroidism, unspecified: Secondary | ICD-10-CM

## 2018-06-24 DIAGNOSIS — E782 Mixed hyperlipidemia: Secondary | ICD-10-CM | POA: Diagnosis not present

## 2018-06-24 DIAGNOSIS — Z79899 Other long term (current) drug therapy: Secondary | ICD-10-CM | POA: Diagnosis not present

## 2018-06-24 DIAGNOSIS — R7309 Other abnormal glucose: Secondary | ICD-10-CM | POA: Diagnosis not present

## 2018-06-24 DIAGNOSIS — I255 Ischemic cardiomyopathy: Secondary | ICD-10-CM | POA: Diagnosis not present

## 2018-06-24 DIAGNOSIS — R7303 Prediabetes: Secondary | ICD-10-CM | POA: Diagnosis not present

## 2018-06-24 DIAGNOSIS — E559 Vitamin D deficiency, unspecified: Secondary | ICD-10-CM | POA: Diagnosis not present

## 2018-06-24 DIAGNOSIS — I951 Orthostatic hypotension: Secondary | ICD-10-CM | POA: Diagnosis not present

## 2018-06-24 NOTE — Addendum Note (Signed)
Addended by: Eulis Canner on: 06/24/2018 10:18 AM   Modules accepted: Orders

## 2018-06-25 DIAGNOSIS — C069 Malignant neoplasm of mouth, unspecified: Secondary | ICD-10-CM | POA: Diagnosis not present

## 2018-06-25 LAB — COMPLETE METABOLIC PANEL WITH GFR
AG Ratio: 1.5 (calc) (ref 1.0–2.5)
ALT: 15 U/L (ref 9–46)
AST: 19 U/L (ref 10–35)
Albumin: 3.9 g/dL (ref 3.6–5.1)
Alkaline phosphatase (APISO): 105 U/L (ref 35–144)
BUN/Creatinine Ratio: 20 (calc) (ref 6–22)
BUN: 25 mg/dL (ref 7–25)
CO2: 26 mmol/L (ref 20–32)
Calcium: 8.9 mg/dL (ref 8.6–10.3)
Chloride: 100 mmol/L (ref 98–110)
Creat: 1.25 mg/dL — ABNORMAL HIGH (ref 0.70–1.11)
GFR, Est African American: 59 mL/min/{1.73_m2} — ABNORMAL LOW (ref >=60)
GFR, Est Non African American: 51 mL/min/{1.73_m2} — ABNORMAL LOW (ref >=60)
Globulin: 2.6 g/dL (calc) (ref 1.9–3.7)
Glucose, Bld: 82 mg/dL (ref 65–99)
Potassium: 4.6 mmol/L (ref 3.5–5.3)
Sodium: 134 mmol/L — ABNORMAL LOW (ref 135–146)
Total Bilirubin: 0.6 mg/dL (ref 0.2–1.2)
Total Protein: 6.5 g/dL (ref 6.1–8.1)

## 2018-06-25 LAB — CBC WITH DIFFERENTIAL/PLATELET
Absolute Monocytes: 659 cells/uL (ref 200–950)
Basophils Absolute: 53 cells/uL (ref 0–200)
Basophils Relative: 0.6 %
Eosinophils Absolute: 169 cells/uL (ref 15–500)
Eosinophils Relative: 1.9 %
HCT: 33.4 % — ABNORMAL LOW (ref 38.5–50.0)
Hemoglobin: 11.2 g/dL — ABNORMAL LOW (ref 13.2–17.1)
Lymphs Abs: 917 cells/uL (ref 850–3900)
MCH: 31.6 pg (ref 27.0–33.0)
MCHC: 33.5 g/dL (ref 32.0–36.0)
MCV: 94.4 fL (ref 80.0–100.0)
MPV: 10.4 fL (ref 7.5–12.5)
Monocytes Relative: 7.4 %
Neutro Abs: 7102 cells/uL (ref 1500–7800)
Neutrophils Relative %: 79.8 %
Platelets: 238 10*3/uL (ref 140–400)
RBC: 3.54 10*6/uL — ABNORMAL LOW (ref 4.20–5.80)
RDW: 12.9 % (ref 11.0–15.0)
Total Lymphocyte: 10.3 %
WBC: 8.9 10*3/uL (ref 3.8–10.8)

## 2018-06-25 LAB — LIPID PANEL
Cholesterol: 182 mg/dL (ref <200)
HDL: 62 mg/dL (ref >=40)
LDL Cholesterol (Calc): 102 mg/dL (calc) — ABNORMAL HIGH
Non-HDL Cholesterol (Calc): 120 mg/dL (calc) (ref <130)
Total CHOL/HDL Ratio: 2.9 (calc) (ref <5.0)
Triglycerides: 85 mg/dL (ref <150)

## 2018-06-25 LAB — HEMOGLOBIN A1C
Hgb A1c MFr Bld: 5.5 % of total Hgb (ref <5.7)
Mean Plasma Glucose: 111 (calc)
eAG (mmol/L): 6.2 (calc)

## 2018-06-25 LAB — VITAMIN D 25 HYDROXY (VIT D DEFICIENCY, FRACTURES): Vit D, 25-Hydroxy: 100 ng/mL (ref 30–100)

## 2018-06-25 LAB — TSH: TSH: 5.21 mIU/L — ABNORMAL HIGH (ref 0.40–4.50)

## 2018-06-25 LAB — MAGNESIUM: Magnesium: 2.3 mg/dL (ref 1.5–2.5)

## 2018-06-25 LAB — INSULIN, RANDOM: Insulin: 9.2 u[IU]/mL

## 2018-06-27 ENCOUNTER — Encounter: Payer: Self-pay | Admitting: Podiatry

## 2018-06-27 DIAGNOSIS — E46 Unspecified protein-calorie malnutrition: Secondary | ICD-10-CM | POA: Diagnosis not present

## 2018-06-27 DIAGNOSIS — E860 Dehydration: Secondary | ICD-10-CM | POA: Diagnosis not present

## 2018-06-27 DIAGNOSIS — J3489 Other specified disorders of nose and nasal sinuses: Secondary | ICD-10-CM | POA: Diagnosis not present

## 2018-06-27 DIAGNOSIS — F0390 Unspecified dementia without behavioral disturbance: Secondary | ICD-10-CM | POA: Diagnosis not present

## 2018-06-27 DIAGNOSIS — K1370 Unspecified lesions of oral mucosa: Secondary | ICD-10-CM | POA: Diagnosis not present

## 2018-06-27 DIAGNOSIS — I130 Hypertensive heart and chronic kidney disease with heart failure and stage 1 through stage 4 chronic kidney disease, or unspecified chronic kidney disease: Secondary | ICD-10-CM | POA: Diagnosis not present

## 2018-06-27 DIAGNOSIS — Z85810 Personal history of malignant neoplasm of tongue: Secondary | ICD-10-CM | POA: Diagnosis not present

## 2018-06-27 DIAGNOSIS — K1379 Other lesions of oral mucosa: Secondary | ICD-10-CM | POA: Diagnosis not present

## 2018-06-27 DIAGNOSIS — R590 Localized enlarged lymph nodes: Secondary | ICD-10-CM | POA: Diagnosis not present

## 2018-06-27 DIAGNOSIS — I5042 Chronic combined systolic (congestive) and diastolic (congestive) heart failure: Secondary | ICD-10-CM | POA: Diagnosis not present

## 2018-06-27 DIAGNOSIS — C031 Malignant neoplasm of lower gum: Secondary | ICD-10-CM | POA: Diagnosis not present

## 2018-06-28 ENCOUNTER — Other Ambulatory Visit: Payer: Medicare Other

## 2018-06-28 ENCOUNTER — Other Ambulatory Visit: Payer: Self-pay | Admitting: Otolaryngology

## 2018-06-28 DIAGNOSIS — C069 Malignant neoplasm of mouth, unspecified: Secondary | ICD-10-CM

## 2018-06-29 ENCOUNTER — Ambulatory Visit
Admission: RE | Admit: 2018-06-29 | Discharge: 2018-06-29 | Disposition: A | Discharge status: Home / Self Care | Payer: Medicare Other | Source: Ambulatory Visit | Attending: Otolaryngology | Admitting: Otolaryngology

## 2018-06-29 ENCOUNTER — Other Ambulatory Visit: Payer: Self-pay

## 2018-06-29 ENCOUNTER — Other Ambulatory Visit: Payer: Medicare Other

## 2018-06-29 DIAGNOSIS — C069 Malignant neoplasm of mouth, unspecified: Secondary | ICD-10-CM

## 2018-06-29 DIAGNOSIS — R918 Other nonspecific abnormal finding of lung field: Secondary | ICD-10-CM | POA: Diagnosis not present

## 2018-06-29 MED ORDER — IOPAMIDOL (ISOVUE-300) INJECTION 61%
75.0000 mL | Freq: Once | INTRAVENOUS | Status: AC | PRN
  Administered 2018-06-29: 16:00:00 75 mL via INTRAVENOUS

## 2018-06-30 ENCOUNTER — Ambulatory Visit: Payer: Medicare Other | Admitting: Podiatry

## 2018-06-30 DIAGNOSIS — C031 Malignant neoplasm of lower gum: Secondary | ICD-10-CM | POA: Diagnosis not present

## 2018-06-30 DIAGNOSIS — I25810 Atherosclerosis of coronary artery bypass graft(s) without angina pectoris: Secondary | ICD-10-CM | POA: Diagnosis not present

## 2018-06-30 DIAGNOSIS — E46 Unspecified protein-calorie malnutrition: Secondary | ICD-10-CM | POA: Diagnosis present

## 2018-06-30 DIAGNOSIS — I501 Left ventricular failure: Secondary | ICD-10-CM | POA: Diagnosis not present

## 2018-06-30 DIAGNOSIS — H9193 Unspecified hearing loss, bilateral: Secondary | ICD-10-CM | POA: Diagnosis present

## 2018-06-30 DIAGNOSIS — I255 Ischemic cardiomyopathy: Secondary | ICD-10-CM | POA: Diagnosis not present

## 2018-06-30 DIAGNOSIS — R001 Bradycardia, unspecified: Secondary | ICD-10-CM | POA: Diagnosis not present

## 2018-06-30 DIAGNOSIS — I517 Cardiomegaly: Secondary | ICD-10-CM | POA: Diagnosis not present

## 2018-06-30 DIAGNOSIS — I5042 Chronic combined systolic (congestive) and diastolic (congestive) heart failure: Secondary | ICD-10-CM | POA: Diagnosis present

## 2018-06-30 DIAGNOSIS — I10 Essential (primary) hypertension: Secondary | ICD-10-CM | POA: Diagnosis not present

## 2018-06-30 DIAGNOSIS — R627 Adult failure to thrive: Secondary | ICD-10-CM | POA: Diagnosis present

## 2018-06-30 DIAGNOSIS — I340 Nonrheumatic mitral (valve) insufficiency: Secondary | ICD-10-CM | POA: Diagnosis not present

## 2018-06-30 DIAGNOSIS — E785 Hyperlipidemia, unspecified: Secondary | ICD-10-CM | POA: Diagnosis not present

## 2018-06-30 DIAGNOSIS — K061 Gingival enlargement: Secondary | ICD-10-CM | POA: Diagnosis not present

## 2018-06-30 DIAGNOSIS — Z781 Physical restraint status: Secondary | ICD-10-CM | POA: Diagnosis not present

## 2018-06-30 DIAGNOSIS — J449 Chronic obstructive pulmonary disease, unspecified: Secondary | ICD-10-CM | POA: Diagnosis present

## 2018-06-30 DIAGNOSIS — M47892 Other spondylosis, cervical region: Secondary | ICD-10-CM | POA: Diagnosis not present

## 2018-06-30 DIAGNOSIS — E860 Dehydration: Secondary | ICD-10-CM | POA: Diagnosis not present

## 2018-06-30 DIAGNOSIS — F0390 Unspecified dementia without behavioral disturbance: Secondary | ICD-10-CM | POA: Diagnosis present

## 2018-06-30 DIAGNOSIS — I5189 Other ill-defined heart diseases: Secondary | ICD-10-CM | POA: Diagnosis not present

## 2018-06-30 DIAGNOSIS — E440 Moderate protein-calorie malnutrition: Secondary | ICD-10-CM | POA: Diagnosis not present

## 2018-06-30 DIAGNOSIS — I4589 Other specified conduction disorders: Secondary | ICD-10-CM | POA: Diagnosis not present

## 2018-06-30 DIAGNOSIS — Z4659 Encounter for fitting and adjustment of other gastrointestinal appliance and device: Secondary | ICD-10-CM | POA: Diagnosis not present

## 2018-06-30 DIAGNOSIS — I454 Nonspecific intraventricular block: Secondary | ICD-10-CM | POA: Diagnosis not present

## 2018-06-30 DIAGNOSIS — I6523 Occlusion and stenosis of bilateral carotid arteries: Secondary | ICD-10-CM | POA: Diagnosis not present

## 2018-06-30 DIAGNOSIS — I2510 Atherosclerotic heart disease of native coronary artery without angina pectoris: Secondary | ICD-10-CM | POA: Diagnosis present

## 2018-06-30 DIAGNOSIS — K068 Other specified disorders of gingiva and edentulous alveolar ridge: Secondary | ICD-10-CM | POA: Diagnosis not present

## 2018-06-30 DIAGNOSIS — Z87891 Personal history of nicotine dependence: Secondary | ICD-10-CM | POA: Diagnosis not present

## 2018-06-30 DIAGNOSIS — Z95820 Peripheral vascular angioplasty status with implants and grafts: Secondary | ICD-10-CM | POA: Diagnosis not present

## 2018-06-30 DIAGNOSIS — Z85810 Personal history of malignant neoplasm of tongue: Secondary | ICD-10-CM | POA: Diagnosis not present

## 2018-06-30 DIAGNOSIS — C039 Malignant neoplasm of gum, unspecified: Secondary | ICD-10-CM | POA: Diagnosis not present

## 2018-06-30 DIAGNOSIS — L89302 Pressure ulcer of unspecified buttock, stage 2: Secondary | ICD-10-CM | POA: Diagnosis present

## 2018-06-30 DIAGNOSIS — I351 Nonrheumatic aortic (valve) insufficiency: Secondary | ICD-10-CM | POA: Diagnosis not present

## 2018-06-30 DIAGNOSIS — N189 Chronic kidney disease, unspecified: Secondary | ICD-10-CM | POA: Diagnosis present

## 2018-06-30 DIAGNOSIS — I361 Nonrheumatic tricuspid (valve) insufficiency: Secondary | ICD-10-CM | POA: Diagnosis not present

## 2018-06-30 DIAGNOSIS — I130 Hypertensive heart and chronic kidney disease with heart failure and stage 1 through stage 4 chronic kidney disease, or unspecified chronic kidney disease: Secondary | ICD-10-CM | POA: Diagnosis present

## 2018-06-30 DIAGNOSIS — Z951 Presence of aortocoronary bypass graft: Secondary | ICD-10-CM | POA: Diagnosis not present

## 2018-07-05 DIAGNOSIS — I517 Cardiomegaly: Secondary | ICD-10-CM | POA: Diagnosis not present

## 2018-07-12 ENCOUNTER — Telehealth: Payer: Self-pay | Admitting: Internal Medicine

## 2018-07-12 ENCOUNTER — Encounter: Payer: Self-pay | Admitting: Internal Medicine

## 2018-07-12 DIAGNOSIS — L89152 Pressure ulcer of sacral region, stage 2: Secondary | ICD-10-CM

## 2018-07-12 NOTE — Telephone Encounter (Signed)
Received call from [facility] @ [facility], provider is requesting Dr Melford Aase order [facility] for patient with c/o pressure ulcer at sacrum area. Per Dr Unk Pinto, faxed order for Home Health evaluation to [place].

## 2018-07-12 NOTE — Patient Instructions (Signed)
Returned Patient's spouse, Clarise Cruz call. She has declined Home Health at this time for wound care of  pressure ulcer of sacrum. She States she has been applying barrier cream with clean dressing, and  wound is beginning to heal. States she prefers not to have people in her home presently. Will call back with updates or other needs. Returned call to [place] [email or web address]  ENT, left voicemail to call back our office.

## 2018-07-13 ENCOUNTER — Other Ambulatory Visit: Payer: Self-pay | Admitting: Internal Medicine

## 2018-07-13 DIAGNOSIS — Z1159 Encounter for screening for other viral diseases: Secondary | ICD-10-CM | POA: Diagnosis not present

## 2018-07-13 DIAGNOSIS — Z01818 Encounter for other preprocedural examination: Secondary | ICD-10-CM | POA: Diagnosis not present

## 2018-07-16 DIAGNOSIS — M8458XA Pathological fracture in neoplastic disease, other specified site, initial encounter for fracture: Secondary | ICD-10-CM | POA: Diagnosis present

## 2018-07-16 DIAGNOSIS — C7951 Secondary malignant neoplasm of bone: Secondary | ICD-10-CM | POA: Diagnosis not present

## 2018-07-16 DIAGNOSIS — I110 Hypertensive heart disease with heart failure: Secondary | ICD-10-CM | POA: Diagnosis present

## 2018-07-16 DIAGNOSIS — C031 Malignant neoplasm of lower gum: Secondary | ICD-10-CM | POA: Diagnosis present

## 2018-07-16 DIAGNOSIS — Z951 Presence of aortocoronary bypass graft: Secondary | ICD-10-CM | POA: Diagnosis not present

## 2018-07-16 DIAGNOSIS — Z9181 History of falling: Secondary | ICD-10-CM | POA: Diagnosis not present

## 2018-07-16 DIAGNOSIS — I5042 Chronic combined systolic (congestive) and diastolic (congestive) heart failure: Secondary | ICD-10-CM | POA: Diagnosis present

## 2018-07-16 DIAGNOSIS — Z87891 Personal history of nicotine dependence: Secondary | ICD-10-CM | POA: Diagnosis not present

## 2018-07-16 DIAGNOSIS — Z950 Presence of cardiac pacemaker: Secondary | ICD-10-CM | POA: Diagnosis not present

## 2018-07-16 DIAGNOSIS — E782 Mixed hyperlipidemia: Secondary | ICD-10-CM | POA: Diagnosis present

## 2018-07-16 DIAGNOSIS — J449 Chronic obstructive pulmonary disease, unspecified: Secondary | ICD-10-CM | POA: Diagnosis present

## 2018-07-16 DIAGNOSIS — F0390 Unspecified dementia without behavioral disturbance: Secondary | ICD-10-CM | POA: Diagnosis present

## 2018-07-20 DIAGNOSIS — M278 Other specified diseases of jaws: Secondary | ICD-10-CM | POA: Diagnosis not present

## 2018-07-20 DIAGNOSIS — C031 Malignant neoplasm of lower gum: Secondary | ICD-10-CM | POA: Diagnosis not present

## 2018-07-20 DIAGNOSIS — Z9889 Other specified postprocedural states: Secondary | ICD-10-CM | POA: Diagnosis not present

## 2018-07-20 DIAGNOSIS — Z85810 Personal history of malignant neoplasm of tongue: Secondary | ICD-10-CM | POA: Diagnosis not present

## 2018-07-20 DIAGNOSIS — F0390 Unspecified dementia without behavioral disturbance: Secondary | ICD-10-CM | POA: Diagnosis not present

## 2018-07-20 DIAGNOSIS — F0151 Vascular dementia with behavioral disturbance: Secondary | ICD-10-CM | POA: Diagnosis not present

## 2018-07-20 DIAGNOSIS — C039 Malignant neoplasm of gum, unspecified: Secondary | ICD-10-CM | POA: Diagnosis not present

## 2018-07-20 DIAGNOSIS — Z87891 Personal history of nicotine dependence: Secondary | ICD-10-CM | POA: Diagnosis not present

## 2018-07-20 DIAGNOSIS — Z85818 Personal history of malignant neoplasm of other sites of lip, oral cavity, and pharynx: Secondary | ICD-10-CM | POA: Diagnosis not present

## 2018-07-20 DIAGNOSIS — I255 Ischemic cardiomyopathy: Secondary | ICD-10-CM | POA: Diagnosis not present

## 2018-07-21 ENCOUNTER — Telehealth: Payer: Self-pay | Admitting: Internal Medicine

## 2018-07-21 NOTE — Telephone Encounter (Signed)
Spoke with [facility], information provided.  [name], NP 07/21/2018 11:08 AM

## 2018-07-21 NOTE — Telephone Encounter (Signed)
[facility] would like to know specific information about the patient's pacemaker prior to treatment.   They would like to know the type of device, manufacturer, model, serial number, date of insertion, Location of device, why it was placed, whether the patient is dependent on the device or not, the last time the pacemaker was interrogated  If there is a picture of the ID card, you can fax it to [phone]  Cell phone number of radiation oncology resident was provided

## 2018-07-23 DIAGNOSIS — Z510 Encounter for antineoplastic radiation therapy: Secondary | ICD-10-CM | POA: Diagnosis not present

## 2018-07-23 DIAGNOSIS — C031 Malignant neoplasm of lower gum: Secondary | ICD-10-CM | POA: Diagnosis not present

## 2018-07-27 DIAGNOSIS — Z510 Encounter for antineoplastic radiation therapy: Secondary | ICD-10-CM | POA: Diagnosis not present

## 2018-07-27 DIAGNOSIS — C031 Malignant neoplasm of lower gum: Secondary | ICD-10-CM | POA: Diagnosis not present

## 2018-07-28 DIAGNOSIS — Z7189 Other specified counseling: Secondary | ICD-10-CM | POA: Diagnosis not present

## 2018-07-28 DIAGNOSIS — K1379 Other lesions of oral mucosa: Secondary | ICD-10-CM | POA: Diagnosis not present

## 2018-07-28 DIAGNOSIS — R52 Pain, unspecified: Secondary | ICD-10-CM | POA: Diagnosis not present

## 2018-07-28 DIAGNOSIS — C031 Malignant neoplasm of lower gum: Secondary | ICD-10-CM | POA: Diagnosis not present

## 2018-07-28 DIAGNOSIS — Z515 Encounter for palliative care: Secondary | ICD-10-CM | POA: Diagnosis not present

## 2018-07-28 DIAGNOSIS — C049 Malignant neoplasm of floor of mouth, unspecified: Secondary | ICD-10-CM | POA: Diagnosis not present

## 2018-07-28 DIAGNOSIS — Z510 Encounter for antineoplastic radiation therapy: Secondary | ICD-10-CM | POA: Diagnosis not present

## 2018-07-29 DIAGNOSIS — C031 Malignant neoplasm of lower gum: Secondary | ICD-10-CM | POA: Diagnosis not present

## 2018-07-29 DIAGNOSIS — Z510 Encounter for antineoplastic radiation therapy: Secondary | ICD-10-CM | POA: Diagnosis not present

## 2018-07-30 DIAGNOSIS — I4891 Unspecified atrial fibrillation: Secondary | ICD-10-CM | POA: Diagnosis not present

## 2018-07-30 DIAGNOSIS — C029 Malignant neoplasm of tongue, unspecified: Secondary | ICD-10-CM | POA: Diagnosis not present

## 2018-07-30 DIAGNOSIS — E46 Unspecified protein-calorie malnutrition: Secondary | ICD-10-CM | POA: Diagnosis not present

## 2018-07-30 DIAGNOSIS — Z681 Body mass index (BMI) 19 or less, adult: Secondary | ICD-10-CM | POA: Diagnosis not present

## 2018-07-30 DIAGNOSIS — I10 Essential (primary) hypertension: Secondary | ICD-10-CM | POA: Diagnosis not present

## 2018-07-30 DIAGNOSIS — I429 Cardiomyopathy, unspecified: Secondary | ICD-10-CM | POA: Diagnosis not present

## 2018-07-31 DIAGNOSIS — Z681 Body mass index (BMI) 19 or less, adult: Secondary | ICD-10-CM | POA: Diagnosis not present

## 2018-07-31 DIAGNOSIS — I429 Cardiomyopathy, unspecified: Secondary | ICD-10-CM | POA: Diagnosis not present

## 2018-07-31 DIAGNOSIS — C029 Malignant neoplasm of tongue, unspecified: Secondary | ICD-10-CM | POA: Diagnosis not present

## 2018-07-31 DIAGNOSIS — I4891 Unspecified atrial fibrillation: Secondary | ICD-10-CM | POA: Diagnosis not present

## 2018-07-31 DIAGNOSIS — I10 Essential (primary) hypertension: Secondary | ICD-10-CM | POA: Diagnosis not present

## 2018-07-31 DIAGNOSIS — E46 Unspecified protein-calorie malnutrition: Secondary | ICD-10-CM | POA: Diagnosis not present

## 2018-08-02 DIAGNOSIS — I10 Essential (primary) hypertension: Secondary | ICD-10-CM | POA: Diagnosis not present

## 2018-08-02 DIAGNOSIS — Z681 Body mass index (BMI) 19 or less, adult: Secondary | ICD-10-CM | POA: Diagnosis not present

## 2018-08-02 DIAGNOSIS — C029 Malignant neoplasm of tongue, unspecified: Secondary | ICD-10-CM | POA: Diagnosis not present

## 2018-08-02 DIAGNOSIS — E46 Unspecified protein-calorie malnutrition: Secondary | ICD-10-CM | POA: Diagnosis not present

## 2018-08-02 DIAGNOSIS — I429 Cardiomyopathy, unspecified: Secondary | ICD-10-CM | POA: Diagnosis not present

## 2018-08-02 DIAGNOSIS — I4891 Unspecified atrial fibrillation: Secondary | ICD-10-CM | POA: Diagnosis not present

## 2018-08-03 DIAGNOSIS — I429 Cardiomyopathy, unspecified: Secondary | ICD-10-CM | POA: Diagnosis not present

## 2018-08-03 DIAGNOSIS — E46 Unspecified protein-calorie malnutrition: Secondary | ICD-10-CM | POA: Diagnosis not present

## 2018-08-03 DIAGNOSIS — I10 Essential (primary) hypertension: Secondary | ICD-10-CM | POA: Diagnosis not present

## 2018-08-03 DIAGNOSIS — C029 Malignant neoplasm of tongue, unspecified: Secondary | ICD-10-CM | POA: Diagnosis not present

## 2018-08-03 DIAGNOSIS — I4891 Unspecified atrial fibrillation: Secondary | ICD-10-CM | POA: Diagnosis not present

## 2018-08-03 DIAGNOSIS — Z681 Body mass index (BMI) 19 or less, adult: Secondary | ICD-10-CM | POA: Diagnosis not present

## 2018-08-04 DIAGNOSIS — I429 Cardiomyopathy, unspecified: Secondary | ICD-10-CM | POA: Diagnosis not present

## 2018-08-04 DIAGNOSIS — C029 Malignant neoplasm of tongue, unspecified: Secondary | ICD-10-CM | POA: Diagnosis not present

## 2018-08-04 DIAGNOSIS — Z681 Body mass index (BMI) 19 or less, adult: Secondary | ICD-10-CM | POA: Diagnosis not present

## 2018-08-04 DIAGNOSIS — E46 Unspecified protein-calorie malnutrition: Secondary | ICD-10-CM | POA: Diagnosis not present

## 2018-08-04 DIAGNOSIS — I10 Essential (primary) hypertension: Secondary | ICD-10-CM | POA: Diagnosis not present

## 2018-08-04 DIAGNOSIS — I4891 Unspecified atrial fibrillation: Secondary | ICD-10-CM | POA: Diagnosis not present

## 2018-08-16 DIAGNOSIS — C031 Malignant neoplasm of lower gum: Secondary | ICD-10-CM | POA: Diagnosis not present

## 2018-08-17 DIAGNOSIS — K1370 Unspecified lesions of oral mucosa: Secondary | ICD-10-CM | POA: Diagnosis not present

## 2018-08-18 ENCOUNTER — Other Ambulatory Visit: Payer: Medicare Other

## 2018-09-01 DEATH — deceased

## 2018-09-23 ENCOUNTER — Ambulatory Visit: Payer: Medicare Other | Admitting: Physician Assistant

## 2019-01-13 ENCOUNTER — Encounter: Payer: Self-pay | Admitting: Internal Medicine

## 2019-03-28 ENCOUNTER — Ambulatory Visit: Payer: Self-pay | Admitting: Adult Health

## 2020-07-16 IMAGING — CT CT HEAD CODE STROKE
3 series · 15 of 47 positions shown, 18 images · non-contrast
Comparison: CT head 07/24/2015

CLINICAL DATA: Code stroke.  New onset diplopia today.

EXAM:
CT HEAD WITHOUT CONTRAST
TECHNIQUE: Contiguous axial images were obtained from the base of the skull
through the vertex without intravenous contrast.

[Series 3: head 5.0 st · axial · 0.43mm/px · z∈[-92,+33]mm · 9 of 31 slices shown, 12 images]
[im 3/31  brain]
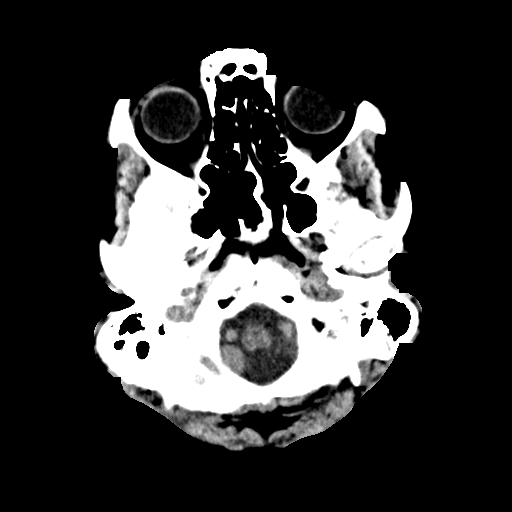
[im 3/31  bone]
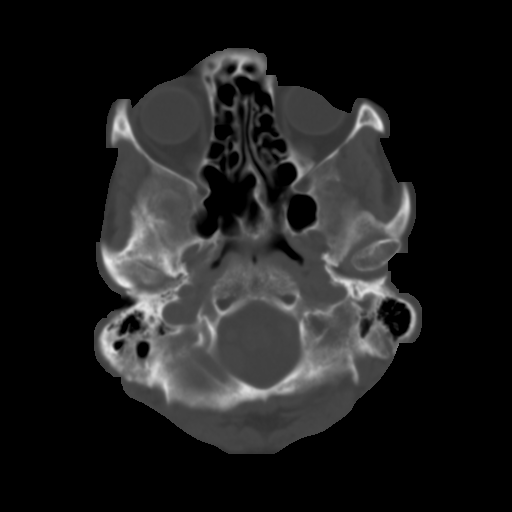
[im 6/31  brain]
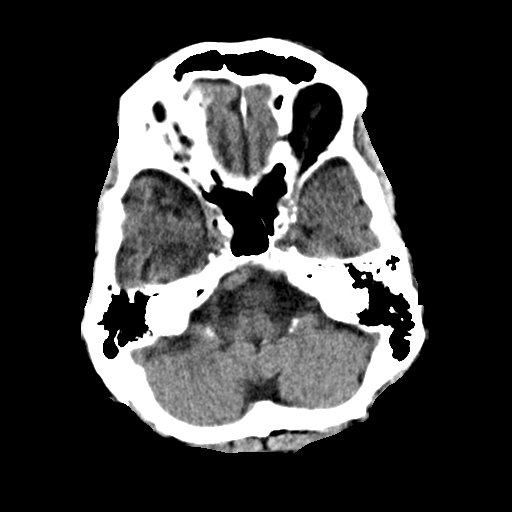
[im 9/31  brain]
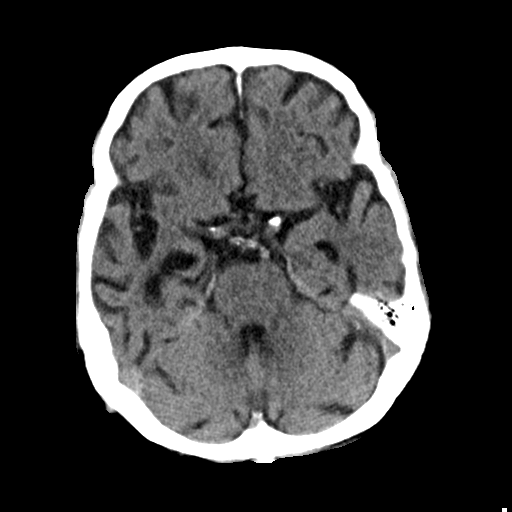
[im 12/31  brain]
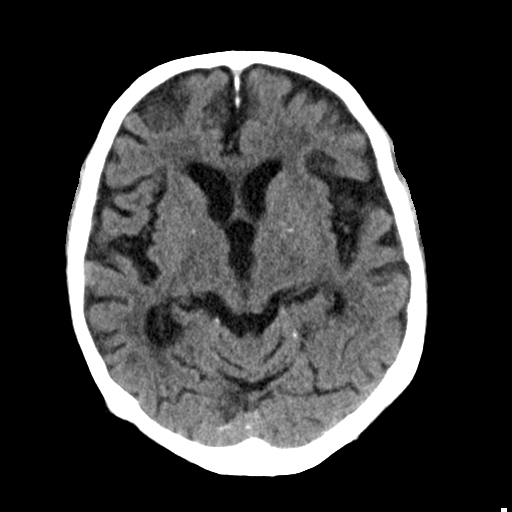
[im 16/31  brain]
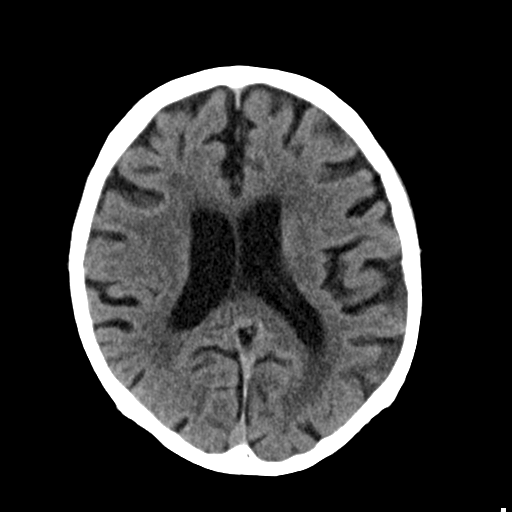
[im 16/31  bone]
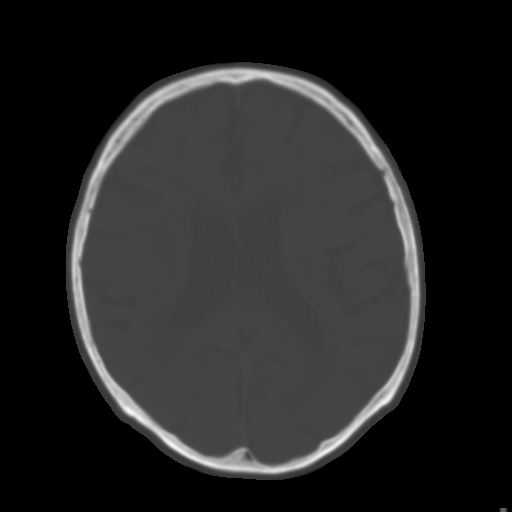
[im 19/31  brain]
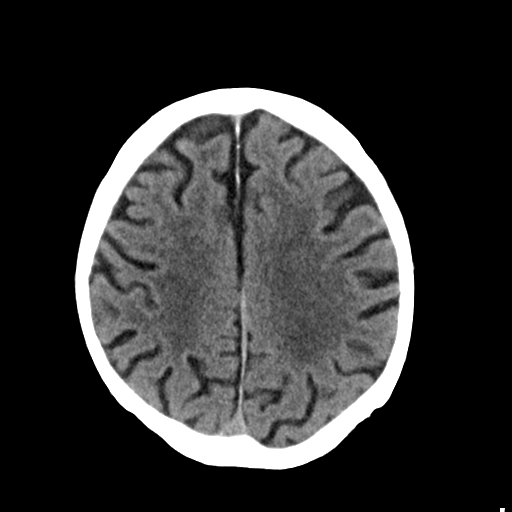
[im 22/31  brain]
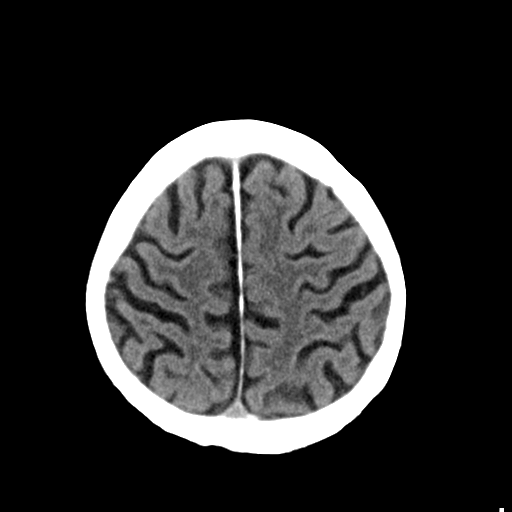
[im 25/31  brain]
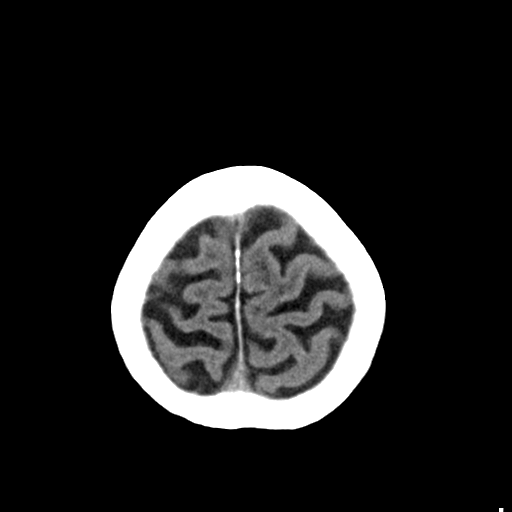
[im 28/31  brain]
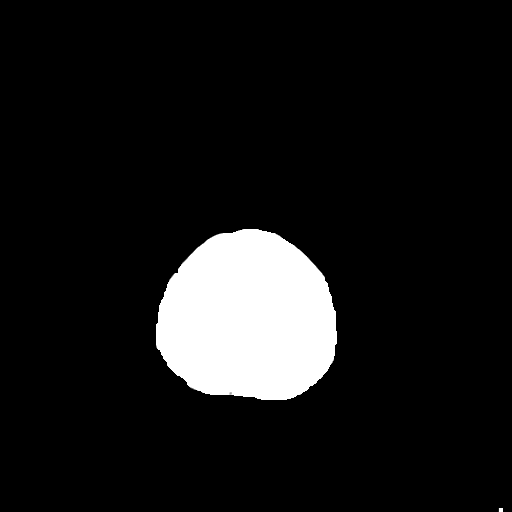
[im 28/31  bone]
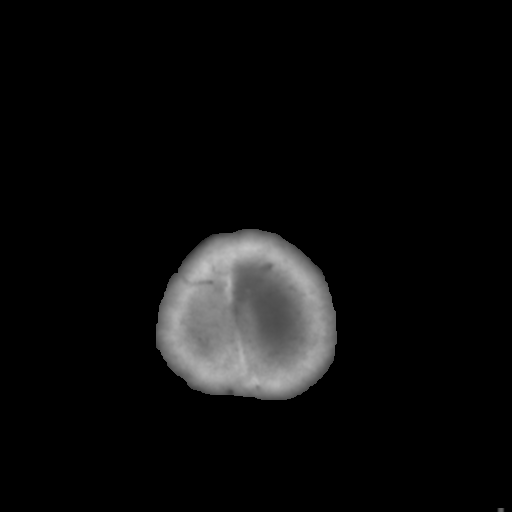

[Series 5: head 3.0 cor st · coronal · 0.30mm/px · 3 of 67 slices shown]
[im 23/67  brain]
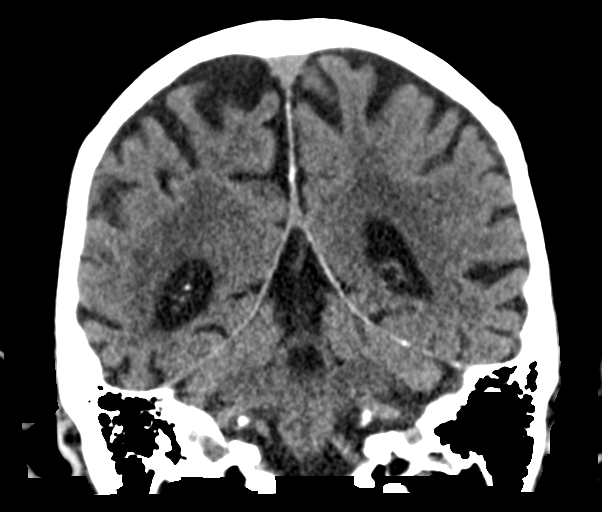
[im 30/67  brain]
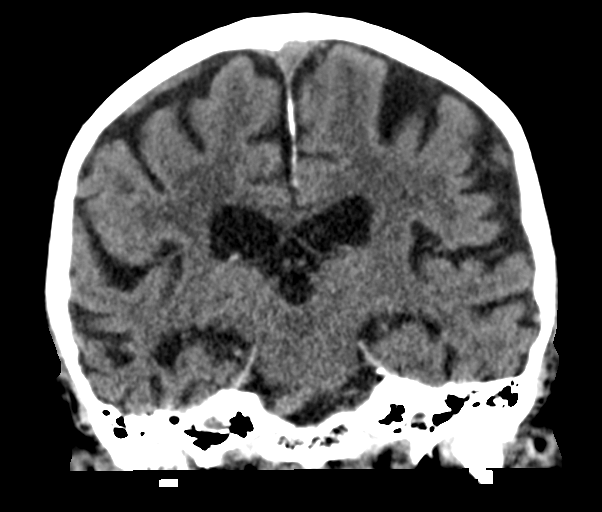
[im 37/67  brain]
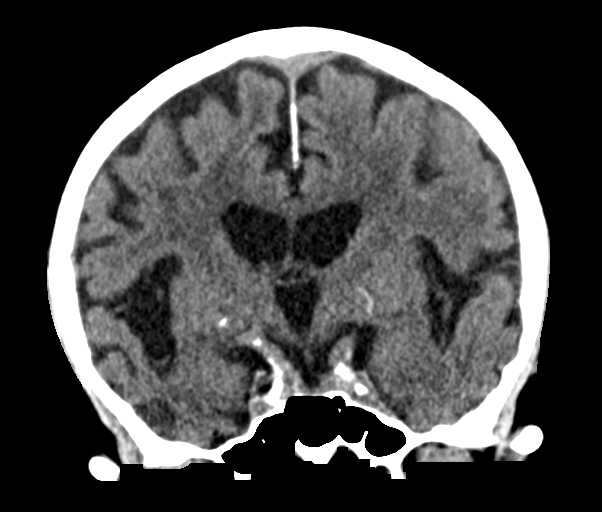

[Series 6: head 3.0 sag st · sagittal · 0.30mm/px · 3 of 58 slices shown]
[im 20/58  brain]
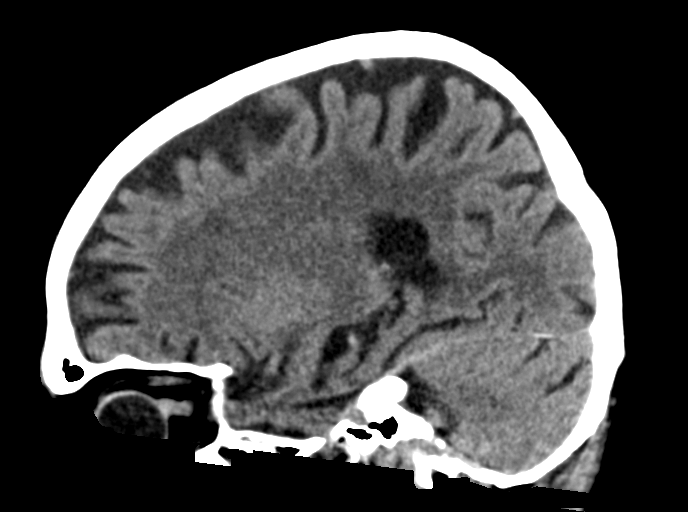
[im 29/58  brain]
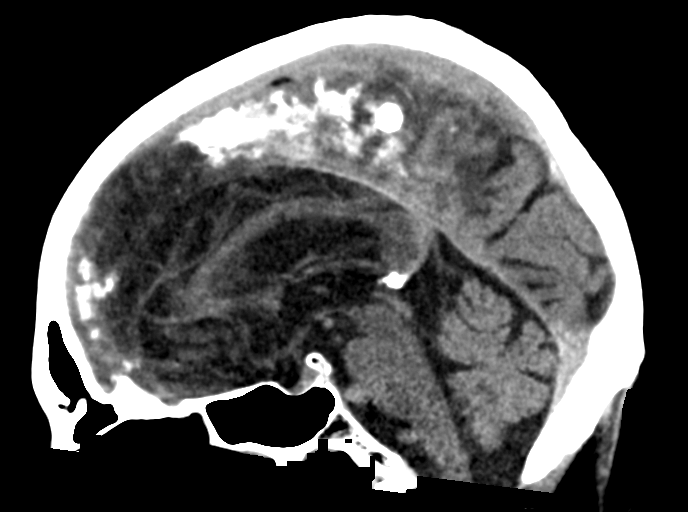
[im 39/58  brain]
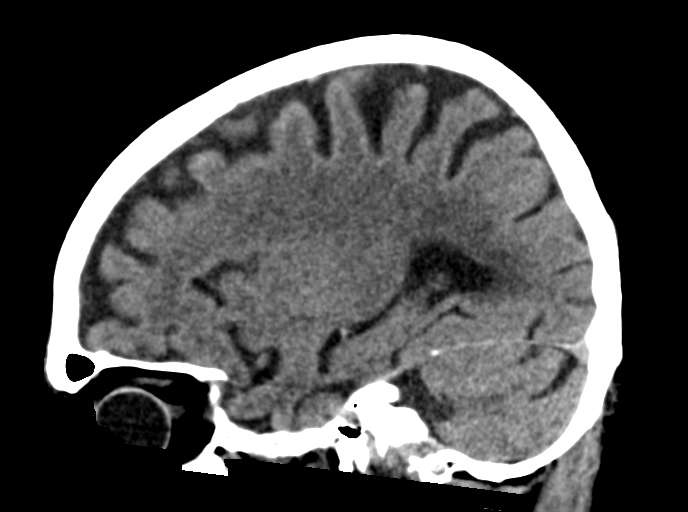

[15 of 47 positions shown; findings below may reference images not displayed]

FINDINGS: Brain: Moderate to advanced atrophy similar to the prior study.
Hypodense changes throughout the cerebral white matter bilaterally
with mild progression. Negative for acute infarct, hemorrhage, or
mass.

Vascular: Atherosclerotic calcification cavernous carotid
bilaterally. Negative for hyperdense vessel.

Skull: Negative

Sinuses/Orbits: Bilateral cataract surgery. Chronic sinusitis in the
maxillary sinus bilaterally with mucosal edema and bony thickening.

Other: None

ASPECTS (Alberta Stroke Program Early CT Score)

- Ganglionic level infarction (caudate, lentiform nuclei, internal
capsule, insula, M1-M3 cortex): 7

- Supraganglionic infarction (M4-M6 cortex): 3

Total score (0-10 with 10 being normal): 10
IMPRESSION: 1. No acute intracranial abnormality
2. Moderate to advanced atrophy. Progression of chronic
microvascular ischemia in the white matter.
3. ASPECTS is 10
4. These results were called by telephone at the time of
interpretation on 11/04/2017 at [DATE] to Dr. Elassri , who
verbally acknowledged these results.

## 2020-07-17 IMAGING — CR DG CHEST 2V
2 series · 2 of 2 positions shown · non-contrast
Comparison: Chest x-ray dated 06/02/2017.

CLINICAL DATA: Cardiac device in situ

EXAM:
CHEST - 2 VIEW

[chest lat]
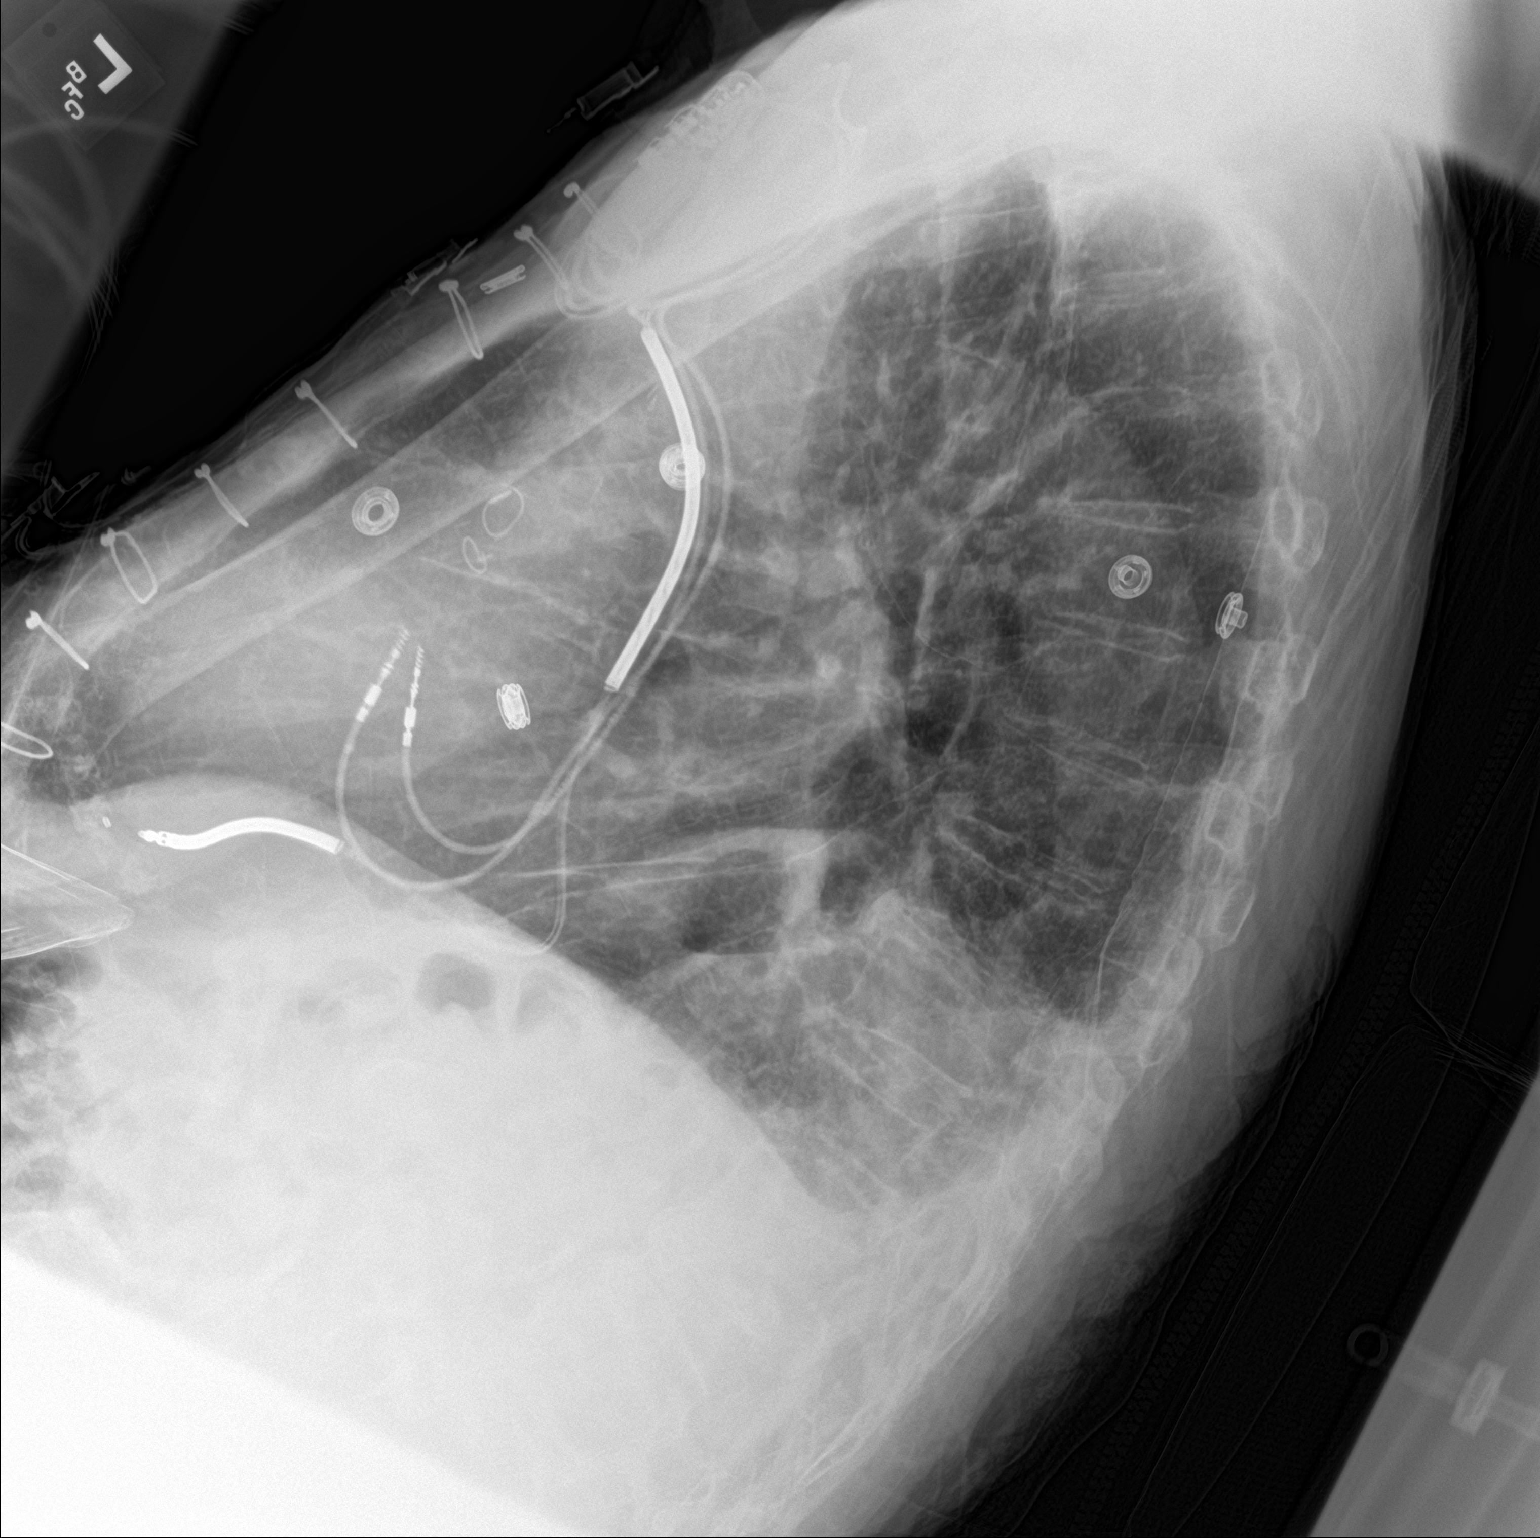

[chest ap]
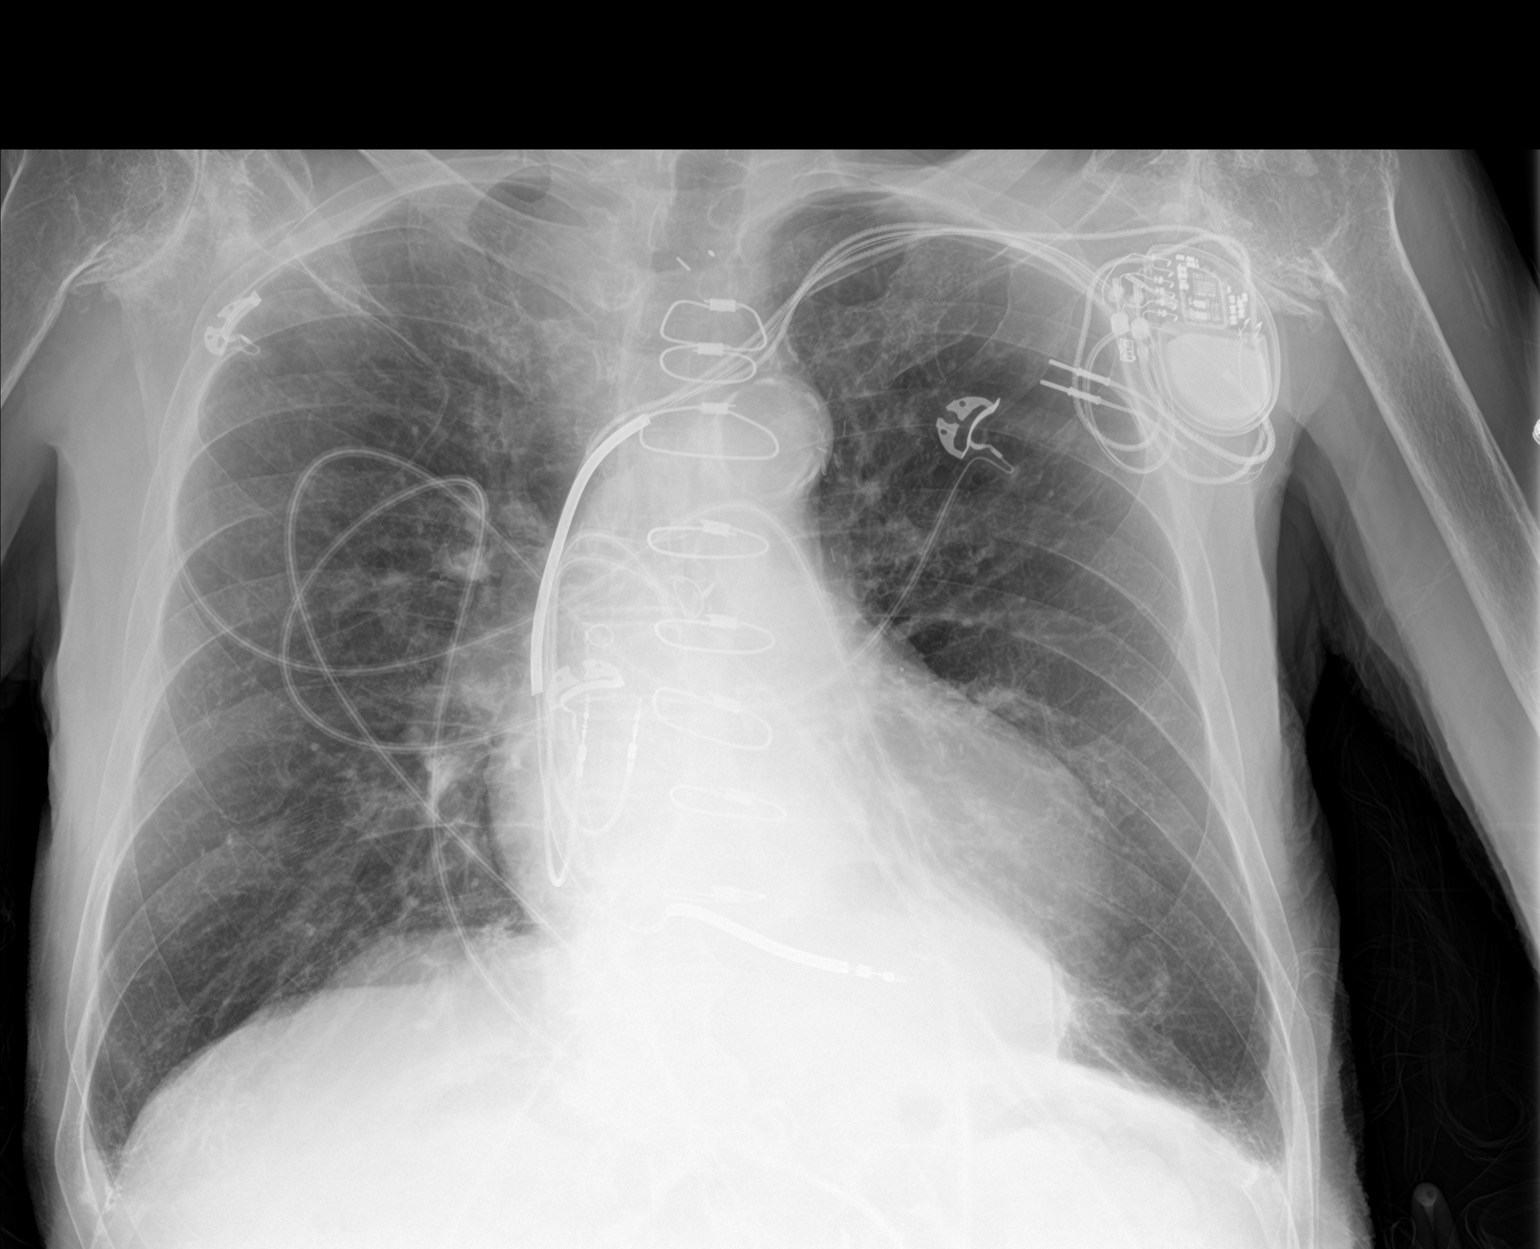

[2 of 2 positions shown; findings below may reference images not displayed]

FINDINGS: Heart size and mediastinal contours appear stable. Median sternotomy
wires appear intact and stable in alignment. LEFT chest wall
pacemaker in place, now with 3 leads.

Lungs are clear. No pleural effusion or pneumothorax seen. Hiatal
hernia better demonstrated on previous chest x-ray. Advanced
degenerative changes noted again noted at both shoulders.
IMPRESSION: 1. LEFT chest wall pacemaker in place, now with 3 leads. No evidence
of lead discontinuity. No pleural effusion, pneumothorax or other
procedural complicating feature identified.
2. Lungs are clear.  No evidence of pneumonia or pulmonary edema.
3. Large hiatal hernia better demonstrated on previous chest x-ray.
